# Patient Record
Sex: Female | Born: 1990 | Race: White | Hispanic: No | Marital: Single | Smoking: Former smoker
Health system: Southern US, Community
[De-identification: ages and names within clinical notes are randomized; demographics above are authoritative.]

## PROBLEM LIST (undated history)

## (undated) ENCOUNTER — Inpatient Hospital Stay (HOSPITAL_COMMUNITY): Payer: Medicaid Other

## (undated) ENCOUNTER — Inpatient Hospital Stay (HOSPITAL_COMMUNITY): Payer: Self-pay

## (undated) DIAGNOSIS — F319 Bipolar disorder, unspecified: Secondary | ICD-10-CM

## (undated) DIAGNOSIS — F209 Schizophrenia, unspecified: Secondary | ICD-10-CM

## (undated) DIAGNOSIS — A749 Chlamydial infection, unspecified: Secondary | ICD-10-CM

## (undated) DIAGNOSIS — A5901 Trichomonal vulvovaginitis: Secondary | ICD-10-CM

## (undated) DIAGNOSIS — F1990 Other psychoactive substance use, unspecified, uncomplicated: Secondary | ICD-10-CM

## (undated) DIAGNOSIS — B029 Zoster without complications: Secondary | ICD-10-CM

## (undated) DIAGNOSIS — F32A Depression, unspecified: Secondary | ICD-10-CM

## (undated) DIAGNOSIS — F329 Major depressive disorder, single episode, unspecified: Secondary | ICD-10-CM

## (undated) HISTORY — PX: DILATION AND EVACUATION: SHX1459

## (undated) HISTORY — PX: KNEE SURGERY: SHX244

---
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
## 2011-03-07 LAB — URINALYSIS, ROUTINE W REFLEX MICROSCOPIC
Nitrite: NEGATIVE
Protein, ur: 30 mg/dL — AB
Urobilinogen, UA: 0.2 mg/dL (ref 0.0–1.0)

## 2011-03-07 LAB — GC/CHLAMYDIA PROBE AMP, GENITAL
Chlamydia, DNA Probe: POSITIVE — AB
GC Probe Amp, Genital: POSITIVE — AB

## 2011-03-07 LAB — WET PREP, GENITAL: WBC, Wet Prep HPF POC: NONE SEEN

## 2011-03-07 LAB — URINE MICROSCOPIC-ADD ON

## 2011-03-12 LAB — DIFFERENTIAL
Basophils Absolute: 0.1 K/uL (ref 0.0–0.1)
Basophils Relative: 1 % (ref 0–1)
Eosinophils Absolute: 0 K/uL (ref 0.0–0.7)
Eosinophils Relative: 0 % (ref 0–5)
Eosinophils Relative: 1 % (ref 0–5)
Lymphocytes Relative: 17 % (ref 12–46)
Lymphocytes Relative: 13 % (ref 12–46)
Lymphocytes Relative: 20 % (ref 12–46)
Lymphs Abs: 1.6 K/uL (ref 0.7–4.0)
Lymphs Abs: 1.2 10*3/uL (ref 0.7–4.0)
Lymphs Abs: 1.3 10*3/uL (ref 0.7–4.0)
Monocytes Absolute: 0.9 K/uL (ref 0.1–1.0)
Monocytes Absolute: 0.9 10*3/uL (ref 0.1–1.0)
Monocytes Absolute: 0.4 10*3/uL (ref 0.1–1.0)
Monocytes Relative: 9 % (ref 3–12)
Monocytes Relative: 6 % (ref 3–12)
Neutro Abs: 6.8 K/uL (ref 1.7–7.7)
Neutro Abs: 4.7 10*3/uL (ref 1.7–7.7)
Neutrophils Relative %: 73 % (ref 43–77)

## 2011-03-12 LAB — URINALYSIS, ROUTINE W REFLEX MICROSCOPIC
Bilirubin Urine: NEGATIVE
Nitrite: NEGATIVE
Nitrite: NEGATIVE
Specific Gravity, Urine: 1.02 (ref 1.005–1.030)
Specific Gravity, Urine: 1.024 (ref 1.005–1.030)
pH: 7 (ref 5.0–8.0)

## 2011-03-12 LAB — CBC
HCT: 33.3 % — ABNORMAL LOW (ref 36.0–46.0)
HCT: 33.3 % — ABNORMAL LOW (ref 36.0–46.0)
Hemoglobin: 11.3 g/dL — ABNORMAL LOW (ref 12.0–15.0)
Hemoglobin: 11.3 g/dL — ABNORMAL LOW (ref 12.0–15.0)
Hemoglobin: 14 g/dL (ref 12.0–15.0)
MCH: 31.6 pg (ref 26.0–34.0)
MCHC: 33.9 g/dL (ref 30.0–36.0)
MCHC: 33.8 g/dL (ref 30.0–36.0)
MCV: 93.2 fL (ref 78.0–100.0)
MCV: 92.1 fL (ref 78.0–100.0)
Platelets: 197 K/uL (ref 150–400)
Platelets: 173 10*3/uL (ref 150–400)
RBC: 3.58 MIL/uL — ABNORMAL LOW (ref 3.87–5.11)
RBC: 3.62 MIL/uL — ABNORMAL LOW (ref 3.87–5.11)
RBC: 4.41 MIL/uL (ref 3.87–5.11)
RDW: 14.8 % (ref 11.5–15.5)
WBC: 9.4 K/uL (ref 4.0–10.5)
WBC: 9 10*3/uL (ref 4.0–10.5)

## 2011-03-12 LAB — URINE MICROSCOPIC-ADD ON

## 2011-03-12 LAB — URINE CULTURE

## 2011-03-12 LAB — WET PREP, GENITAL
Trich, Wet Prep: NONE SEEN
Yeast Wet Prep HPF POC: NONE SEEN

## 2011-03-12 LAB — POCT PREGNANCY, URINE
Preg Test, Ur: NEGATIVE
Preg Test, Ur: NEGATIVE
Preg Test, Ur: NEGATIVE
Preg Test, Ur: POSITIVE

## 2011-03-12 LAB — BASIC METABOLIC PANEL
Chloride: 106 mEq/L (ref 96–112)
GFR calc Af Amer: >60 mL/min (ref >60)
Potassium: 3.5 mEq/L (ref 3.5–5.1)

## 2011-03-12 LAB — GC/CHLAMYDIA PROBE AMP, GENITAL: GC Probe Amp, Genital: POSITIVE — AB

## 2011-03-12 LAB — RAPID STREP SCREEN (MED CTR MEBANE ONLY): Streptococcus, Group A Screen (Direct): NEGATIVE

## 2011-03-27 ENCOUNTER — Emergency Department (HOSPITAL_COMMUNITY)
Admission: EM | Admit: 2011-03-27 | Discharge: 2011-03-28 | Disposition: A | Discharge status: Home / Self Care | Payer: Self-pay | Attending: Emergency Medicine | Admitting: Emergency Medicine

## 2011-03-27 DIAGNOSIS — O2390 Unspecified genitourinary tract infection in pregnancy, unspecified trimester: Secondary | ICD-10-CM | POA: Insufficient documentation

## 2011-03-27 DIAGNOSIS — N760 Acute vaginitis: Secondary | ICD-10-CM | POA: Insufficient documentation

## 2011-03-27 DIAGNOSIS — B9689 Other specified bacterial agents as the cause of diseases classified elsewhere: Secondary | ICD-10-CM | POA: Insufficient documentation

## 2011-03-27 DIAGNOSIS — R10814 Left lower quadrant abdominal tenderness: Secondary | ICD-10-CM | POA: Insufficient documentation

## 2011-03-27 DIAGNOSIS — A499 Bacterial infection, unspecified: Secondary | ICD-10-CM | POA: Insufficient documentation

## 2011-03-27 DIAGNOSIS — R109 Unspecified abdominal pain: Secondary | ICD-10-CM | POA: Insufficient documentation

## 2011-03-27 LAB — URINALYSIS, ROUTINE W REFLEX MICROSCOPIC
Glucose, UA: NEGATIVE mg/dL
Hgb urine dipstick: NEGATIVE
Specific Gravity, Urine: 1.029 (ref 1.005–1.030)
pH: 6 (ref 5.0–8.0)

## 2011-03-27 LAB — URINE MICROSCOPIC-ADD ON

## 2011-03-28 ENCOUNTER — Emergency Department (HOSPITAL_COMMUNITY): Payer: Self-pay

## 2011-03-28 LAB — GC/CHLAMYDIA PROBE AMP, GENITAL: Chlamydia, DNA Probe: UNDETERMINED

## 2011-03-28 LAB — BASIC METABOLIC PANEL
CO2: 26 mEq/L (ref 19–32)
GFR calc Af Amer: >60 mL/min (ref >60)
GFR calc non Af Amer: >60 mL/min (ref >60)
Glucose, Bld: 89 mg/dL (ref 70–99)
Potassium: 3.7 mEq/L (ref 3.5–5.1)
Sodium: 138 mEq/L (ref 135–145)

## 2011-03-29 LAB — URINE CULTURE: Culture: NO GROWTH

## 2011-04-01 LAB — COMPREHENSIVE METABOLIC PANEL
ALT: 19 U/L (ref 0–35)
AST: 22 U/L (ref 0–37)
Albumin: 4.5 g/dL (ref 3.5–5.2)
Alkaline Phosphatase: 67 U/L (ref 47–119)
BUN: 10 mg/dL (ref 6–23)
Chloride: 103 mEq/L (ref 96–112)
Potassium: 3.7 mEq/L (ref 3.5–5.1)
Sodium: 137 mEq/L (ref 135–145)
Total Bilirubin: 0.4 mg/dL (ref 0.3–1.2)
Total Protein: 7.4 g/dL (ref 6.0–8.3)

## 2011-04-01 LAB — CBC
HCT: 39.1 % (ref 36.0–49.0)
Platelets: 246 10*3/uL (ref 150–400)
RDW: 13.8 % (ref 11.4–15.5)
WBC: 8.8 10*3/uL (ref 4.5–13.5)

## 2011-04-01 LAB — DIFFERENTIAL
Basophils Absolute: 0.1 10*3/uL (ref 0.0–0.1)
Basophils Relative: 1 % (ref 0–1)
Eosinophils Absolute: 0 10*3/uL (ref 0.0–1.2)
Eosinophils Relative: 0 % (ref 0–5)
Monocytes Absolute: 0.7 10*3/uL (ref 0.2–1.2)
Monocytes Relative: 8 % (ref 3–11)

## 2011-04-01 LAB — URINE MICROSCOPIC-ADD ON

## 2011-04-01 LAB — URINE CULTURE

## 2011-04-01 LAB — POCT PREGNANCY, URINE: Preg Test, Ur: NEGATIVE

## 2011-04-01 LAB — URINALYSIS, ROUTINE W REFLEX MICROSCOPIC
Nitrite: POSITIVE — AB
Specific Gravity, Urine: 1.027 (ref 1.005–1.030)
Urobilinogen, UA: 1 mg/dL (ref 0.0–1.0)
pH: 6 (ref 5.0–8.0)

## 2011-05-09 NOTE — Discharge Summary (Signed)
NAMEMARIROSE, DEVENEY                ACCOUNT NO.:  [id number]   MEDICAL RECORD NO.:  [id number]          PATIENT TYPE:  INP   LOCATION:  9109                          FACILITY:  [facility]   PHYSICIAN:  Zenaida Niece, M.D.DATE OF BIRTH:  October 15, 1991   DATE OF ADMISSION:  12/16/2008  DATE OF DISCHARGE:  12/18/2008                               DISCHARGE SUMMARY   ADMISSION DIAGNOSIS:  Intrauterine pregnancy at 39 weeks.   DISCHARGE DIAGNOSIS:  Intrauterine pregnancy at 39 weeks.   PROCEDURES:  On December 16, 2008, Dr. Ambrose Mantle did a vacuum-assisted  vaginal delivery.   HISTORY AND PHYSICAL:  This is a 20 year old, gravida 1, para 0 with an  EGA of [redacted] weeks, who was admitted for induction with a favorable cervix.  Prenatal care was essentially uncomplicated.   PRENATAL LABS:  Blood type is O positive with a negative antibody  screen, RPR nonreactive, rubella equivocal, hepatitis B surface antigen  negative, HIV negative, gonorrhea and chlamydia negative, first  trimester screen is normal.  MSAFP is normal.  One-hour Glucola is 92.  Group B strep is positive.  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   PAST HISTORY:  Significant for I and D of her right knee and is  otherwise noncontributory.   PHYSICAL EXAMINATION:  GENERAL:  She is afebrile with stable vital  signs.  ABDOMEN:  Soft, gravid with fundal height of 37 cm.  Fetal heart tracing  is normal.  Cervix per Dr. Ambrose Mantle is 3-4, 90 vertex, -1 to -2, and  membranes were ruptured revealing clear fluid.   HOSPITAL COURSE:  The patient was admitted for induction and was placed  on Pitocin, and membranes were ruptured.  She progressed into labor and  received an epidural.  She progressed to complete, pushed fair.  The  baby had deep decelerations while she was pushing with terminal  bradycardia.  Dr. Ambrose Mantle performed a Kiwi vacuum extraction of a viable  female infant with Apgars of 9 and  9, weight 5 pounds 13 ounces.  Placenta delivered and was intact and uterus palpated normal.  She had a  right labial laceration which was hemostatic and not repaired.  Estimated blood loss was 400 mL.  Postpartum, she had no significant  complications.  She did have some back discomfort.  Pre-delivery  hemoglobin 12.7 and post-delivery 11.6.  On postpartum #2, she was felt  to be stable enough for discharge home.   DISCHARGE INSTRUCTIONS:  Regular diet.  Pelvic rest.  Follow up in 6  weeks.   MEDICATIONS:  Over-the-counter ibuprofen as needed and she was given our  discharge pamphlet.     Zenaida Niece, M.D.  Electronically Signed    TDM/MEDQ  D:  12/18/2008  T:  12/18/2008  Job:  [id number]
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
## 2011-07-02 ENCOUNTER — Inpatient Hospital Stay (HOSPITAL_COMMUNITY): Payer: Self-pay | Admitting: Obstetrics & Gynecology

## 2011-07-02 ENCOUNTER — Inpatient Hospital Stay (HOSPITAL_COMMUNITY)
Admission: AD | Admit: 2011-07-02 | Discharge: 2011-07-02 | Disposition: A | Discharge status: Home / Self Care | Payer: Self-pay | Source: Ambulatory Visit | Attending: Obstetrics & Gynecology | Admitting: Obstetrics & Gynecology

## 2011-07-02 ENCOUNTER — Encounter (HOSPITAL_COMMUNITY): Payer: Self-pay | Admitting: *Deleted

## 2011-07-02 ENCOUNTER — Inpatient Hospital Stay (HOSPITAL_COMMUNITY): Admission: AD | Admit: 2011-07-02 | Payer: Self-pay | Source: Ambulatory Visit | Admitting: Obstetrics & Gynecology

## 2011-07-02 DIAGNOSIS — R109 Unspecified abdominal pain: Secondary | ICD-10-CM

## 2011-07-02 DIAGNOSIS — O368190 Decreased fetal movements, unspecified trimester, not applicable or unspecified: Secondary | ICD-10-CM | POA: Insufficient documentation

## 2011-07-02 DIAGNOSIS — R51 Headache: Secondary | ICD-10-CM | POA: Insufficient documentation

## 2011-07-02 HISTORY — DX: Zoster without complications: B02.9

## 2011-07-02 LAB — URINALYSIS, ROUTINE W REFLEX MICROSCOPIC
Bilirubin Urine: NEGATIVE
Glucose, UA: NEGATIVE mg/dL
Hgb urine dipstick: NEGATIVE
Ketones, ur: NEGATIVE mg/dL
pH: 7.5 (ref 5.0–8.0)

## 2011-07-02 LAB — URINE DRUGS OF ABUSE SCREEN W ALC, ROUTINE (REF LAB)
Amphetamine Screen, Ur: NEGATIVE
Barbiturate Quant, Ur: NEGATIVE
Cocaine Metabolites: NEGATIVE
Creatinine,U: 194.5 mg/dL
Marijuana Metabolite: POSITIVE — AB
Methadone: NEGATIVE

## 2011-07-02 LAB — URINE MICROSCOPIC-ADD ON

## 2011-07-02 NOTE — ED Provider Notes (Signed)
History     Chief Complaint  Patient presents with  . Decreased Fetal Movement    Pt came in c/o decreases fetal movement, lower abd cramping, headache   HPI  Pertinent Gynecological History: Menses: decreased fetal movemant Bleeding:none Contraception:  DES exposure:none Blood transfusions: none Sexually transmitted diseases: denies Previous GYN Procedures:none  Last mammogram:n/a Last pap: unknown   Past Medical History  Diagnosis Date  . Shingles     Past Surgical History  Procedure Date  . Knee surgery     History reviewed. No pertinent family history.  History  Substance Use Topics  . Smoking status: Current Some Day Smoker -- 0.2 packs/day  . Smokeless tobacco: Never Used  . Alcohol Use: No    Allergies: No Known Allergies  No prescriptions prior to admission    ROS Physical Exam   Blood pressure 116/72, pulse 93, temperature 98 F (36.7 C), temperature source Oral, resp. rate 18, height 5' 3.5" (1.613 m), weight 55.702 kg (122 lb 12.8 oz).  Physical Exam  MAU Course  Procedures  MDM Normal exam

## 2011-07-02 NOTE — ED Provider Notes (Addendum)
History     Chief Complaint  Patient presents with  . Decreased Fetal Movement    Pt came in c/o decreases fetal movement, lower abd cramping, headache   HPI  Pertinent Gynecological History: Menses: n/a Bleeding: n/a Contraception: none DES exposure: denies Blood transfusions: none Sexually transmitted diseases:denies Previous GYN Procedures: none  Last mammogram:n/a Last FAO:[id number]   Past Medical History  Diagnosis Date  . Shingles     Past Surgical History  Procedure Date  . Knee surgery     History reviewed. No pertinent family history.  History  Substance Use Topics  . Smoking status: Current Some Day Smoker -- 0.2 packs/day  . Smokeless tobacco: Never Used  . Alcohol Use: No    Allergies: No Known Allergies  No prescriptions prior to admission    ROS Physical Exam   Blood pressure 116/72, pulse 93, temperature 98 F (36.7 C), temperature source Oral, resp. rate 18, height 5' 3.5" (1.613 m), weight 55.702 kg (122 lb 12.8 oz).  Physical Exam  Constitutional: She is oriented to person, place, and time. She appears well-developed and well-nourished.  HENT:  Head: Normocephalic.  Cardiovascular: Normal rate, regular rhythm and normal heart sounds.   Respiratory: Effort normal and breath sounds normal.  GI: Soft.       Gravid, uterus @ umbilicus.  Genitourinary: Vagina normal.  Musculoskeletal: Normal range of motion.  Neurological: She is alert and oriented to person, place, and time. She has normal reflexes.  Skin: Skin is warm and dry.  Psychiatric: She has a normal mood and affect. Her behavior is normal. Judgment and thought content normal.    MAU Course  Procedures  MDM Normal physical exam

## 2011-07-05 LAB — THC (MARIJUANA), URINE, CONFIRMATION: Marijuana, Ur-Confirmation: 99 NG/ML — ABNORMAL HIGH

## 2011-09-09 ENCOUNTER — Inpatient Hospital Stay (HOSPITAL_COMMUNITY)
Admission: AD | Admit: 2011-09-09 | Discharge: 2011-09-09 | Disposition: A | Discharge status: Home / Self Care | Payer: Self-pay | Source: Ambulatory Visit | Attending: Obstetrics and Gynecology | Admitting: Obstetrics and Gynecology

## 2011-09-09 ENCOUNTER — Encounter (HOSPITAL_COMMUNITY): Payer: Self-pay | Admitting: *Deleted

## 2011-09-09 DIAGNOSIS — N898 Other specified noninflammatory disorders of vagina: Secondary | ICD-10-CM | POA: Insufficient documentation

## 2011-09-09 DIAGNOSIS — O26899 Other specified pregnancy related conditions, unspecified trimester: Secondary | ICD-10-CM

## 2011-09-09 DIAGNOSIS — O99891 Other specified diseases and conditions complicating pregnancy: Secondary | ICD-10-CM | POA: Insufficient documentation

## 2011-09-09 LAB — URINALYSIS, ROUTINE W REFLEX MICROSCOPIC
Bilirubin Urine: NEGATIVE
Nitrite: NEGATIVE
Specific Gravity, Urine: 1.01 (ref 1.005–1.030)
Urobilinogen, UA: 0.2 mg/dL (ref 0.0–1.0)

## 2011-09-09 LAB — URINE MICROSCOPIC-ADD ON

## 2011-09-09 NOTE — ED Provider Notes (Signed)
Stacy Peterson is a 20 y.o. female G3P1011 at 31.4 wks by known LMP 02/01/11 who presents for eval of mucous discharge. Maternal Medical History:  Reason for admission: Reason for Admission:   nausea  OB History    Grav Para Term Preterm Abortions TAB SAB Ect Mult Living   3 1 1  1 1    1      Past Medical History  Diagnosis Date  . Shingles   . No pertinent past medical history    Past Surgical History  Procedure Date  . Knee surgery   . Knee surgery    Family History: family history is not on file. Social History:  reports that she has been smoking.  She has never used smokeless tobacco. She reports that she does not drink alcohol or use illicit drugs.  Review of Systems  Constitutional: Negative for fever.  Gastrointestinal: Negative for nausea and vomiting.  Genitourinary: Negative for dysuria.      Blood pressure 112/65, pulse 96, temperature 98.9 F (37.2 C), temperature source Oral, resp. rate 18, height 5' 3.25" (1.607 m), weight 59.648 kg (131 lb 8 oz). Maternal Exam:  Uterine Assessment: No ctx per toco  Abdomen: Fundal height is 30cm.    Introitus: Normal vulva. Normal vagina.  Cervix: Cervix evaluated by digital exam.   Cx closed/thick/post Small tan colored mucous seen at introitus with appearance similar to cervical mucous; nonodorous  Fetal Exam Fetal Monitor Review: Baseline rate: 135.  Variability: moderate (6-25 bpm).   Pattern: accelerations present and no decelerations.    Fetal State Assessment: Category I - tracings are normal.     Physical Exam  Constitutional: She is oriented to person, place, and time. She appears well-developed and well-nourished.  Respiratory: Effort normal.  Musculoskeletal: Normal range of motion.  Neurological: She is oriented to person, place, and time.  Skin: Skin is warm and dry.  Psychiatric: She has a normal mood and affect. Her behavior is normal.    Prenatal labs: ABO, Rh:   Antibody:   Rubella:   RPR:     HBsAg:    HIV:    GBS:     Assessment/Plan: IUP at 31.4 (by LMP; agrees with exam) Vaginal discharge  D/C home with pregnancy precautions Given preg verification letter to speed up process to obtain MCD Follow up for ctx, pain, bldg or leaking as needed   Cam Hai 09/09/2011, 10:53 PM

## 2011-09-09 NOTE — Progress Notes (Signed)
Pt states, " I felt something on my leg while I was in the shower, and when I reached and touched it was thick and mucous like my mucous plug. I have been cramping off and on since 7 pm tonight." .

## 2011-09-09 NOTE — Progress Notes (Signed)
Pt states she she picked up her 20 year old at about 60. Pt states she started feeling discomfort immediately after that.

## 2011-09-13 NOTE — ED Provider Notes (Signed)
Agree with above note.  Stacy Peterson 09/13/2011 10:29 AM

## 2011-09-20 LAB — URINALYSIS, ROUTINE W REFLEX MICROSCOPIC
Bilirubin Urine: NEGATIVE
Nitrite: NEGATIVE
Specific Gravity, Urine: 1.023
pH: 6.5

## 2011-09-20 LAB — HCG, QUANTITATIVE, PREGNANCY

## 2011-09-20 LAB — POCT I-STAT, CHEM 8
BUN: 6
Calcium, Ion: 1.25
Chloride: 102
Creatinine, Ser: 0.7
Glucose, Bld: 104 — ABNORMAL HIGH
HCT: 42
Hemoglobin: 14.3
Potassium: 3.6
Sodium: 138
TCO2: 22

## 2011-09-20 LAB — URINE MICROSCOPIC-ADD ON

## 2011-09-20 LAB — CBC
MCHC: 34.3
RDW: 13.1

## 2011-09-20 LAB — DIFFERENTIAL
Basophils Absolute: 0
Basophils Relative: 0
Eosinophils Absolute: 0
Eosinophils Relative: 0
Lymphocytes Relative: 5 — ABNORMAL LOW
Lymphs Abs: 0.9 — ABNORMAL LOW
Monocytes Absolute: 0.8
Monocytes Relative: 4
Neutro Abs: 17.4 — ABNORMAL HIGH
Neutrophils Relative %: 91 — ABNORMAL HIGH

## 2011-09-20 LAB — POCT PREGNANCY, URINE: Operator id: 294501

## 2011-09-20 LAB — ABO/RH: ABO/RH(D): O POS

## 2011-09-20 LAB — SEDIMENTATION RATE: Sed Rate: 6

## 2011-09-26 LAB — WET PREP, GENITAL: Trich, Wet Prep: NONE SEEN

## 2011-09-29 LAB — RPR: RPR Ser Ql: NONREACTIVE

## 2011-09-29 LAB — CBC
HCT: 33.4 % — ABNORMAL LOW (ref 36.0–49.0)
MCHC: 34.6 g/dL (ref 31.0–37.0)
MCV: 98.2 fL — ABNORMAL HIGH (ref 78.0–98.0)
MCV: 99.5 fL — ABNORMAL HIGH (ref 78.0–98.0)
Platelets: 145 10*3/uL — ABNORMAL LOW (ref 150–400)
RBC: 3.75 MIL/uL — ABNORMAL LOW (ref 3.80–5.70)
RDW: 13.4 % (ref 11.4–15.5)

## 2011-11-03 ENCOUNTER — Inpatient Hospital Stay (HOSPITAL_COMMUNITY)
Admission: AD | Admit: 2011-11-03 | Discharge: 2011-11-05 | DRG: 775 | Disposition: A | Discharge status: Home / Self Care | Payer: Self-pay | Source: Ambulatory Visit | Attending: Obstetrics & Gynecology | Admitting: Obstetrics & Gynecology

## 2011-11-03 ENCOUNTER — Encounter (HOSPITAL_COMMUNITY): Payer: Self-pay | Admitting: *Deleted

## 2011-11-03 ENCOUNTER — Encounter (HOSPITAL_COMMUNITY): Payer: Self-pay | Admitting: Anesthesiology

## 2011-11-03 ENCOUNTER — Inpatient Hospital Stay (HOSPITAL_COMMUNITY): Payer: Self-pay | Admitting: Anesthesiology

## 2011-11-03 DIAGNOSIS — O0930 Supervision of pregnancy with insufficient antenatal care, unspecified trimester: Secondary | ICD-10-CM

## 2011-11-03 LAB — DIFFERENTIAL
Basophils Absolute: 0 10*3/uL (ref 0.0–0.1)
Basophils Relative: 0 % (ref 0–1)
Eosinophils Relative: 1 % (ref 0–5)
Monocytes Absolute: 0.9 10*3/uL (ref 0.1–1.0)

## 2011-11-03 LAB — CBC
HCT: 32.8 % — ABNORMAL LOW (ref 36.0–46.0)
MCHC: 33.2 g/dL (ref 30.0–36.0)
MCV: 89.9 fL (ref 78.0–100.0)
RDW: 14.6 % (ref 11.5–15.5)

## 2011-11-03 LAB — RPR: RPR Ser Ql: NONREACTIVE

## 2011-11-03 MED ORDER — PRENATAL PLUS 27-1 MG PO TABS
1.0000 | ORAL_TABLET | Freq: Every day | ORAL | Status: DC
  Administered 2011-11-04 – 2011-11-05 (×2): 1 via ORAL
  Filled 2011-11-03 (×2): qty 1

## 2011-11-03 MED ORDER — LACTATED RINGERS IV SOLN: INTRAVENOUS | Status: DC

## 2011-11-03 MED ORDER — LIDOCAINE HCL 1.5 % IJ SOLN
INTRAMUSCULAR | Status: DC | PRN
  Administered 2011-11-03: 5 mL via INTRADERMAL
  Administered 2011-11-03: 2 mL via INTRADERMAL
  Administered 2011-11-03: 5 mL via INTRADERMAL

## 2011-11-03 MED ORDER — ACETAMINOPHEN 325 MG PO TABS: 650.0000 mg | ORAL_TABLET | ORAL | Status: DC | PRN

## 2011-11-03 MED ORDER — DIPHENHYDRAMINE HCL 50 MG/ML IJ SOLN: 12.5000 mg | INTRAMUSCULAR | Status: DC | PRN

## 2011-11-03 MED ORDER — LANOLIN HYDROUS EX OINT: TOPICAL_OINTMENT | CUTANEOUS | Status: DC | PRN

## 2011-11-03 MED ORDER — SENNOSIDES-DOCUSATE SODIUM 8.6-50 MG PO TABS
2.0000 | ORAL_TABLET | Freq: Every day | ORAL | Status: DC
  Administered 2011-11-04: 2 via ORAL

## 2011-11-03 MED ORDER — OXYTOCIN BOLUS FROM INFUSION
500.0000 mL | Freq: Once | INTRAVENOUS | Status: DC
  Filled 2011-11-03: qty 500

## 2011-11-03 MED ORDER — BENZOCAINE-MENTHOL 20-0.5 % EX AERO
INHALATION_SPRAY | CUTANEOUS | Status: AC
  Filled 2011-11-03: qty 56

## 2011-11-03 MED ORDER — LACTATED RINGERS IV SOLN: 500.0000 mL | INTRAVENOUS | Status: DC | PRN

## 2011-11-03 MED ORDER — EPHEDRINE 5 MG/ML INJ: 10.0000 mg | INTRAVENOUS | Status: DC | PRN

## 2011-11-03 MED ORDER — SIMETHICONE 80 MG PO CHEW: 80.0000 mg | CHEWABLE_TABLET | ORAL | Status: DC | PRN

## 2011-11-03 MED ORDER — FENTANYL 2.5 MCG/ML BUPIVACAINE 1/10 % EPIDURAL INFUSION (WH - ANES)
14.0000 mL/h | INTRAMUSCULAR | Status: DC
  Administered 2011-11-03: 14 mL/h via EPIDURAL
  Filled 2011-11-03: qty 60

## 2011-11-03 MED ORDER — DIPHENHYDRAMINE HCL 25 MG PO CAPS: 25.0000 mg | ORAL_CAPSULE | Freq: Four times a day (QID) | ORAL | Status: DC | PRN

## 2011-11-03 MED ORDER — OXYCODONE-ACETAMINOPHEN 5-325 MG PO TABS: 2.0000 | ORAL_TABLET | ORAL | Status: DC | PRN

## 2011-11-03 MED ORDER — ACETAMINOPHEN 325 MG PO TABS
650.0000 mg | ORAL_TABLET | Freq: Four times a day (QID) | ORAL | Status: DC | PRN
  Administered 2011-11-03: 650 mg via ORAL
  Filled 2011-11-03: qty 2

## 2011-11-03 MED ORDER — NALBUPHINE SYRINGE 5 MG/0.5 ML: 5.0000 mg | INJECTION | INTRAMUSCULAR | Status: DC | PRN

## 2011-11-03 MED ORDER — CITRIC ACID-SODIUM CITRATE 334-500 MG/5ML PO SOLN: 30.0000 mL | ORAL | Status: DC | PRN

## 2011-11-03 MED ORDER — DIBUCAINE 1 % RE OINT: 1.0000 "application " | TOPICAL_OINTMENT | RECTAL | Status: DC | PRN

## 2011-11-03 MED ORDER — LIDOCAINE HCL (PF) 1 % IJ SOLN: 30.0000 mL | INTRAMUSCULAR | Status: DC | PRN

## 2011-11-03 MED ORDER — ONDANSETRON HCL 4 MG/2ML IJ SOLN: 4.0000 mg | Freq: Four times a day (QID) | INTRAMUSCULAR | Status: DC | PRN

## 2011-11-03 MED ORDER — WITCH HAZEL-GLYCERIN EX PADS: 1.0000 "application " | MEDICATED_PAD | CUTANEOUS | Status: DC | PRN

## 2011-11-03 MED ORDER — NALBUPHINE SYRINGE 5 MG/0.5 ML
5.0000 mg | INJECTION | INTRAMUSCULAR | Status: DC | PRN
  Administered 2011-11-03: 5 mg via INTRAVENOUS
  Filled 2011-11-03: qty 0.5

## 2011-11-03 MED ORDER — FLEET ENEMA 7-19 GM/118ML RE ENEM: 1.0000 | ENEMA | RECTAL | Status: DC | PRN

## 2011-11-03 MED ORDER — OXYTOCIN 20 UNITS IN LACTATED RINGERS INFUSION - SIMPLE: 125.0000 mL/h | Freq: Once | INTRAVENOUS | Status: DC

## 2011-11-03 MED ORDER — ONDANSETRON HCL 4 MG PO TABS: 4.0000 mg | ORAL_TABLET | ORAL | Status: DC | PRN

## 2011-11-03 MED ORDER — ONDANSETRON 8 MG PO TBDP
8.0000 mg | ORAL_TABLET | Freq: Once | ORAL | Status: AC
  Administered 2011-11-03: 8 mg via ORAL
  Filled 2011-11-03: qty 1

## 2011-11-03 MED ORDER — LACTATED RINGERS IV SOLN: 500.0000 mL | Freq: Once | INTRAVENOUS | Status: DC

## 2011-11-03 MED ORDER — IBUPROFEN 600 MG PO TABS: 600.0000 mg | ORAL_TABLET | Freq: Four times a day (QID) | ORAL | Status: DC | PRN

## 2011-11-03 MED ORDER — EPHEDRINE 5 MG/ML INJ
10.0000 mg | INTRAVENOUS | Status: DC | PRN
  Filled 2011-11-03: qty 4

## 2011-11-03 MED ORDER — LACTATED RINGERS IV SOLN
INTRAVENOUS | Status: DC
  Administered 2011-11-03: 14:00:00 via INTRAVENOUS

## 2011-11-03 MED ORDER — ZOLPIDEM TARTRATE 5 MG PO TABS: 5.0000 mg | ORAL_TABLET | Freq: Every evening | ORAL | Status: DC | PRN

## 2011-11-03 MED ORDER — OXYTOCIN BOLUS FROM INFUSION
500.0000 mL | Freq: Once | INTRAVENOUS | Status: DC
  Filled 2011-11-03: qty 1000
  Filled 2011-11-03: qty 500

## 2011-11-03 MED ORDER — ONDANSETRON HCL 4 MG/2ML IJ SOLN: 4.0000 mg | INTRAMUSCULAR | Status: DC | PRN

## 2011-11-03 MED ORDER — PHENYLEPHRINE 40 MCG/ML (10ML) SYRINGE FOR IV PUSH (FOR BLOOD PRESSURE SUPPORT)
80.0000 ug | PREFILLED_SYRINGE | INTRAVENOUS | Status: DC | PRN
  Filled 2011-11-03: qty 5

## 2011-11-03 MED ORDER — OXYCODONE-ACETAMINOPHEN 5-325 MG PO TABS
1.0000 | ORAL_TABLET | ORAL | Status: DC | PRN
  Administered 2011-11-04 – 2011-11-05 (×3): 1 via ORAL
  Filled 2011-11-03 (×3): qty 1

## 2011-11-03 MED ORDER — PHENYLEPHRINE 40 MCG/ML (10ML) SYRINGE FOR IV PUSH (FOR BLOOD PRESSURE SUPPORT): 80.0000 ug | PREFILLED_SYRINGE | INTRAVENOUS | Status: DC | PRN

## 2011-11-03 MED ORDER — IBUPROFEN 600 MG PO TABS
600.0000 mg | ORAL_TABLET | Freq: Four times a day (QID) | ORAL | Status: DC
  Administered 2011-11-03 – 2011-11-05 (×6): 600 mg via ORAL
  Filled 2011-11-03 (×6): qty 1

## 2011-11-03 MED ORDER — IBUPROFEN 600 MG PO TABS
600.0000 mg | ORAL_TABLET | Freq: Four times a day (QID) | ORAL | Status: DC | PRN
  Administered 2011-11-03: 600 mg via ORAL
  Filled 2011-11-03: qty 1

## 2011-11-03 MED ORDER — BENZOCAINE-MENTHOL 20-0.5 % EX AERO: 1.0000 "application " | INHALATION_SPRAY | CUTANEOUS | Status: DC | PRN

## 2011-11-03 MED ORDER — TETANUS-DIPHTH-ACELL PERTUSSIS 5-2.5-18.5 LF-MCG/0.5 IM SUSP: 0.5000 mL | Freq: Once | INTRAMUSCULAR | Status: DC

## 2011-11-03 NOTE — Anesthesia Preprocedure Evaluation (Signed)
Anesthesia Evaluation  Patient identified by MRN, date of birth, ID band Patient awake    Reviewed: Allergy & Precautions, H&P , NPO status , Patient's Chart, lab work & pertinent test results, reviewed documented beta blocker date and time   History of Anesthesia Complications Negative for: history of anesthetic complications  Airway Mallampati: II TM Distance: >3 FB Neck ROM: full    Dental  (+) Teeth Intact   Pulmonary Current Smoker,  clear to auscultation        Cardiovascular neg cardio ROS regular Normal    Neuro/Psych Negative Neurological ROS  Negative Psych ROS   GI/Hepatic negative GI ROS, Neg liver ROS,   Endo/Other  Negative Endocrine ROS  Renal/GU negative Renal ROS     Musculoskeletal   Abdominal   Peds  Hematology negative hematology ROS (+)   Anesthesia Other Findings   Reproductive/Obstetrics (+) Pregnancy (limited prenatal care)                           Anesthesia Physical Anesthesia Plan  ASA: II  Anesthesia Plan: Epidural   Post-op Pain Management:    Induction:   Airway Management Planned:   Additional Equipment:   Intra-op Plan:   Post-operative Plan:   Informed Consent: I have reviewed the patients History and Physical, chart, labs and discussed the procedure including the risks, benefits and alternatives for the proposed anesthesia with the patient or authorized representative who has indicated his/her understanding and acceptance.     Plan Discussed with:   Anesthesia Plan Comments:         Anesthesia Quick Evaluation

## 2011-11-03 NOTE — ED Provider Notes (Addendum)
History     Chief Complaint  Patient presents with  . Contractions   HPI Mrs. Stacy Peterson is 20 year old at 39.[redacted] weeks EGA who presents with contractions starting at 5:00 AM. They are strong in nature and coming every 4 minutes. She denies leakage of fluid or vaginal bleeding. She notes fetal movements. Of note, the patient has had no prenatal care, aside from a trip to the [facility]. She applied for Medicaid, but has not had an appointment at [facility] yet. She has had 1 ultrasound and not pre-natal labs drawn.    OB History    Grav Para Term Preterm Abortions TAB SAB Ect Mult Living   3 1 1  1 1    1     - Previous delivery was in 2009 at [facility], patient induced   Past Medical History  Diagnosis Date  . Shingles   . No pertinent past medical history     Past Surgical History: Knee Surgery 2009    No family history on file.  History  Substance Use Topics  . Smoking status: Current Some Day Smoker -- 0.2 packs/day  . Smokeless tobacco: Never Used  . Alcohol Use: No    Allergies: No Known Allergies  Prescriptions prior to admission  Medication Sig Dispense Refill  . ibuprofen (ADVIL,MOTRIN) 200 MG tablet Take 400 mg by mouth daily as needed. For pain         ROS positive for nausea and vomiting; negative for fever, chills, headaches, changes in vision, shortness of breath, chest pain   Physical Exam   Blood pressure 122/79, pulse 95, temperature 97.4 F (36.3 C), temperature source Oral, resp. rate 20, height 5\' 4"  (1.626 m), weight 63.504 kg (140 lb).  Physical Exam  Constitutional: Vital signs are normal. She appears well-developed and well-nourished. She appears distressed.  Cardiovascular: Normal rate, regular rhythm, normal heart sounds and intact distal pulses.   Respiratory: Effort normal and breath sounds normal.  GI:       Gravid, non-tender   Genitourinary: Vagina normal.  Musculoskeletal: She exhibits no edema.     MAU Course  Procedures  Dilation:  2.5 Effacement (%): 50 Cervical Position: Posterior Station: -2 Presentation: Vertex Exam by:: Stacy Peterson: Contractions q FHM: baseline 135, moderate variability, accelerations present, no decelerations  - Category I  MDM Patient making cervical change with consistent contractions.   Assessment and Plan  Stacy Peterson has made cervical change since admission, and is having consistent contraction. She is ambulating in the hospital and will be re-evaluated in approximately 30 minutes.   The patient has been signed out to Dr. Candelaria Peterson.   Stacy Peterson 11/03/2011, 12:17 PM    Patient seen.  Having intense pain with contractions.  Repeat cervical exam as follows: Dilation: 4 Effacement (%): 70 Cervical Position: Posterior Station: -2 Presentation: Vertex Exam by:: Stacy Peterson  Assessment and plan #49 20 year old G3 P1 011 at 39 weeks and 3 days #2 insufficient prenatal care #3 GBS unknown  We'll admit the patient to labor and delivery for active labor. We will provide the patient with pain medication with her IV as well as epidural as the patient has indicated that she would like an epidural. She will bottlefeed and [facility] pediatric[facility] will be the physician for the baby.  She does not meet criteria for treatment for GBS prophylaxis.  Stacy Peterson 1:57 PM 11/03/2011

## 2011-11-03 NOTE — Anesthesia Procedure Notes (Signed)
Epidural Patient location during procedure: OB Start time: 11/03/2011 3:49 PM Reason for block: procedure for pain  Staffing Performed by: anesthesiologist   Preanesthetic Checklist Completed: patient identified, site marked, surgical consent, pre-op evaluation, timeout performed, IV checked, risks and benefits discussed and monitors and equipment checked  Epidural Patient position: sitting Prep: site prepped and draped and DuraPrep Patient monitoring: continuous pulse ox and blood pressure Approach: midline Injection technique: LOR air  Needle:  Needle type: Tuohy  Needle gauge: 17 G Needle length: 9 cm Needle insertion depth: 5 cm cm Catheter type: closed end flexible Catheter size: 19 Gauge Catheter at skin depth: 10 cm Test dose: negative  Assessment Events: blood not aspirated, injection not painful, no injection resistance, negative IV test and no paresthesia  Additional Notes Discussed risk of headache, infection, bleeding, nerve injury and failed or incomplete block.  Patient voices understanding and wishes to proceed.

## 2011-11-03 NOTE — Progress Notes (Signed)
Transferred to room 136 via wheelchair.

## 2011-11-03 NOTE — Significant Event (Signed)
At approx 1820,pt went to BR without RN's assistance- was accompanied by sister, who states pt voided a large amt.  Pt reminded about safety precautions from epidural and to call RN for assistance in future until safe ambulation determined. Pt denies falling or any injury.

## 2011-11-03 NOTE — Progress Notes (Signed)
uc's since 0500, denies bleeding or LOF.  No PNC.

## 2011-11-03 NOTE — H&P (Signed)
see MAU note

## 2011-11-04 NOTE — Progress Notes (Signed)
PSYCHOSOCIAL ASSESSMENT ~ MATERNAL/CHILD Name: Stacy Peterson Age: 20 Referral Date: 11/04/2011 Reason/Source: LPNC  I. FAMILY/HOME ENVIRONMENT A. Child's Legal Guardian Name: Stacy Peterson  DOB:   02/28/1991                                               Age: 20                   Address: [address]                                   [place] [address]   Name: Stacy Peterson DOB:                                                  Age:                   Address:    B. Other Household Members/Support Persons Name: Stacy Peterson Relationship:  Sister                  DOB:        Name: Stacy Peterson                   Relationship: Mother               DOB:        Name:  Stacy Peterson                      Relationship: Step-Father               DOB:                   Name:                   Relationship:               DOB: C.   Other Support:   II. PSYCHOSOCIAL DATA A. Information Source: [name] and [name]'s sister                    B. Financial and Community Resources         Employment:    Medicaid:  YES   County: [place]  Private Insurance:                            Self Pay:   Food Stamps:        WIC: YES      Work First:       Public Housing:       Section 8:    Maternity Care Coordination/Child Service Coordination/Early Intervention   School:                                                                         Grade:  Other:  C. Cultural and Environment Information Cultural Issues Impacting Care: N/A III. STRENGTHS             Supportive family/friends: YES             Adequate Resources: YES             Compliance with medical plan: YES             Home prepared for Child (including basic supplies): YES             Understanding of Illness: N/A             Other:   IV. RISK FACTORS AND CURRENT PROBLEMS       No Problems Noted               Substance abuse:                                    Pt:            Family:             Family/Relationship Issues:                      Pt:            Family:             Financial Resources:                               Pt:            Family:             DSS Involvement:                                    Pt:             Family:             Knowledge/Cognitive Deficit:                   Pt:             Family:                Basic Needs(food, housing, etc.)             Pt:             Family:             Mental Illness:                                           Pt:             Family:             Abuse/Neglect/Domestic Violence           Pt:             Family:             Transportation:                                           Pt:              Family:             Adjustment to Illness:                               Pt:              Family:             Compliance with Treatment:                    Pt:              Family:             Housing Concerns                                   Pt:              Family:             Other:               V. SOCIAL WORK ASSESSMENT  CSW spoke to [name].  [name] reported LPNC due to issues with insurance.  No current concerns, medicaid in process along with WIC.  [name] did not report any concerns with support and supplies. Please re-consult CSW if further needs arise.    VI. SOCIAL WORK PLAN (in bold)             No Further Intervention Required/ No Barriers to Discharge             Psychosocial Support and Ongoing Assessment if Needs             Patient/Family Education             Child Protective Services Report                      County:                       Date:             Information/Referral to Community Resources             Other  

## 2011-11-04 NOTE — Progress Notes (Signed)
Post Partum Day 1 Subjective: no complaints, up ad lib and ordered breakfast  Objective: Blood pressure 89/49, pulse 72, temperature 97.9 F (36.6 C), temperature source Oral, resp. rate 19, height 5\' 4"  (1.626 m), weight 63.504 kg (140 lb), SpO2 100.00%, unknown if currently breastfeeding.  Physical Exam:  General: alert and cooperative Lochia: appropriate Uterine Fundus: firm DVT Evaluation: No evidence of DVT seen on physical exam. No significant calf/ankle edema.   Basename 11/03/11 1021  HGB 10.9*  HCT 32.8*    Assessment/Plan: Plan for discharge tomorrow as insufficient PNC Breastfeeding/bottlefeeding and Contraception undecided   LOS: 1 day   Stacy Peterson 11/04/2011, 9:28 AM

## 2011-11-05 MED ORDER — IBUPROFEN 600 MG PO TABS: 600.0000 mg | ORAL_TABLET | Freq: Four times a day (QID) | ORAL | Status: AC

## 2011-11-05 MED ORDER — SENNOSIDES-DOCUSATE SODIUM 8.6-50 MG PO TABS: 2.0000 | ORAL_TABLET | Freq: Every day | ORAL | Status: DC

## 2011-11-05 MED ORDER — FERROUS SULFATE 325 (65 FE) MG PO TABS: 325.0000 mg | ORAL_TABLET | Freq: Every day | ORAL | Status: DC

## 2011-11-05 NOTE — Discharge Summary (Signed)
Obstetric Discharge Summary Reason for Admission: onset of labor Prenatal Procedures: NST Intrapartum Procedures: spontaneous vaginal delivery Postpartum Procedures: none Complications-Operative and Postpartum: none Hemoglobin  Date Value Range Status  11/03/2011 10.9* 12.0-15.0 (g/dL) Final     HCT  Date Value Range Status  11/03/2011 32.8* 36.0-46.0 (%) Final    Discharge Diagnoses: Term Pregnancy-delivered and no prenatal care  20 yo G3P1011 presented at 39.3 weeks with SOOL. Uncomplicated delivery of viable female infant. Patient had no PNC due to insurance problems, Medicaid pending. Prenatal screening labs negative except noted history of marijuana use. SW consult prior to DC revealed no concerns. Patient will f/u at [facility] in 6 weeks to discuss contraception (desires nuva-ring). Bottlefeeding infant. Pain control with motrin.   Discharge Information: Date: 11/05/2011 Activity: pelvic rest Diet: routine Medications: PNV, Ibuprofen, Colace and Iron Condition: stable Instructions: refer to practice specific booklet Discharge to: home Follow-up Information    Follow up with [facility]. Make an appointment in 6 weeks.   Contact information:   [address] [place] [address]          Newborn Data: Live born female  Birth Weight: 6 lb 11 oz (3033 g) APGAR: 9, 9  Home with mother.  Stacy Peterson 11/05/2011, 6:54 AM

## 2011-11-05 NOTE — Progress Notes (Signed)
Post Partum Day 2 Subjective: up ad lib, voiding and tolerating PO; pain controlled with motrin.   Objective: Blood pressure 95/55, pulse 60, temperature 98.5 F (36.9 C), temperature source Oral, resp. rate 18, height 5\' 4"  (1.626 m), weight 63.504 kg (140 lb), SpO2 100.00%, unknown if currently breastfeeding.  Physical Exam:  General: alert and cooperative Lochia: appropriate Uterine Fundus: firm DVT Evaluation: No cords or calf tenderness. No significant calf/ankle edema.   Basename 11/03/11 1021  HGB 10.9*  HCT 32.8*    Assessment/Plan: Social Work consult yesterday for no PNC: cleared for DC. Contraception undecided Bottle feeding.    LOS: 2 days   Ghislaine Harcum 11/05/2011, 6:40 AM

## 2011-11-05 NOTE — Anesthesia Postprocedure Evaluation (Signed)
  Anesthesia Post-op Note  Patient: Stacy Peterson  Procedure(s) Performed: * No procedures listed *  Patient Location: 136  Anesthesia Type: Epidural  Level of Consciousness: awake, alert  and oriented  Airway and Oxygen Therapy: Patient Spontanous Breathing  Post-op Pain: mild  Post-op Assessment: Post-op Vital signs reviewed, Patient's Cardiovascular Status Stable, No headache, No backache, No residual numbness and No residual motor weakness  Post-op Vital Signs: Reviewed and stable  Complications: No apparent anesthesia complications

## 2011-11-06 NOTE — Progress Notes (Signed)
UR chart review completed.  

## 2012-05-28 ENCOUNTER — Emergency Department (HOSPITAL_COMMUNITY): Payer: Self-pay

## 2012-05-28 ENCOUNTER — Emergency Department (HOSPITAL_COMMUNITY)
Admission: EM | Admit: 2012-05-28 | Discharge: 2012-05-28 | Disposition: A | Discharge status: Home / Self Care | Payer: Self-pay | Attending: Emergency Medicine | Admitting: Emergency Medicine

## 2012-05-28 ENCOUNTER — Encounter (HOSPITAL_COMMUNITY): Payer: Self-pay | Admitting: *Deleted

## 2012-05-28 DIAGNOSIS — N390 Urinary tract infection, site not specified: Secondary | ICD-10-CM | POA: Insufficient documentation

## 2012-05-28 DIAGNOSIS — N949 Unspecified condition associated with female genital organs and menstrual cycle: Secondary | ICD-10-CM | POA: Insufficient documentation

## 2012-05-28 DIAGNOSIS — O2390 Unspecified genitourinary tract infection in pregnancy, unspecified trimester: Secondary | ICD-10-CM | POA: Insufficient documentation

## 2012-05-28 DIAGNOSIS — Z331 Pregnant state, incidental: Secondary | ICD-10-CM | POA: Insufficient documentation

## 2012-05-28 DIAGNOSIS — R109 Unspecified abdominal pain: Secondary | ICD-10-CM | POA: Insufficient documentation

## 2012-05-28 LAB — URINALYSIS, ROUTINE W REFLEX MICROSCOPIC
Bilirubin Urine: NEGATIVE
Ketones, ur: 40 mg/dL — AB
Nitrite: POSITIVE — AB
Urobilinogen, UA: 0.2 mg/dL (ref 0.0–1.0)
pH: 7 (ref 5.0–8.0)

## 2012-05-28 LAB — URINE MICROSCOPIC-ADD ON

## 2012-05-28 LAB — POCT I-STAT, CHEM 8
Calcium, Ion: 1.2 mmol/L (ref 1.12–1.32)
Chloride: 108 mEq/L (ref 96–112)
Glucose, Bld: 82 mg/dL (ref 70–99)
HCT: 39 % (ref 36.0–46.0)
TCO2: 20 mmol/L (ref 0–100)

## 2012-05-28 LAB — HCG, QUANTITATIVE, PREGNANCY: hCG, Beta Chain, Quant, S: 8808 m[IU]/mL — ABNORMAL HIGH (ref <5)

## 2012-05-28 MED ORDER — ONDANSETRON HCL 4 MG PO TABS: 4.0000 mg | ORAL_TABLET | Freq: Three times a day (TID) | ORAL | Status: AC | PRN

## 2012-05-28 MED ORDER — ONDANSETRON HCL 4 MG/2ML IJ SOLN
4.0000 mg | INTRAMUSCULAR | Status: DC | PRN
  Administered 2012-05-28: 4 mg via INTRAVENOUS
  Filled 2012-05-28: qty 2

## 2012-05-28 MED ORDER — PRENATAL COMPLETE 14-0.4 MG PO TABS: 1.0000 | ORAL_TABLET | Freq: Every day | ORAL | Status: DC

## 2012-05-28 MED ORDER — CEPHALEXIN 500 MG PO CAPS: 500.0000 mg | ORAL_CAPSULE | Freq: Four times a day (QID) | ORAL | Status: AC

## 2012-05-28 MED ORDER — ACETAMINOPHEN 500 MG PO TABS
1000.0000 mg | ORAL_TABLET | Freq: Once | ORAL | Status: AC
  Administered 2012-05-28: 1000 mg via ORAL
  Filled 2012-05-28: qty 2

## 2012-05-28 MED ORDER — SODIUM CHLORIDE 0.9 % IV SOLN
INTRAVENOUS | Status: DC
  Administered 2012-05-28 (×2): via INTRAVENOUS

## 2012-05-28 MED ORDER — DEXTROSE 5 % IV SOLN
1.0000 g | Freq: Once | INTRAVENOUS | Status: AC
  Administered 2012-05-28: 1 g via INTRAVENOUS
  Filled 2012-05-28: qty 10

## 2012-05-28 NOTE — Discharge Instructions (Signed)
RESOURCE GUIDE  Chronic Pain Problems: Contact [facility]  [phone] Patients need to be referred by their primary care doctor.  Insufficient Money for Medicine: Contact United Way:  call "211" or [facility] [phone].  No Primary Care Doctor: - Call [facility]  [phone] - can help you locate a primary care doctor that  accepts your insurance, provides certain services, etc. - Physician Referral Service- 1-800-533-3463  Agencies that provide inexpensive medical care: - [facility]  [phone] - [facility]  [phone] - [facility]  [phone] - [facility]  [phone] - Planned Parenthood  [phone] - [facility]  [phone]  Medicaid-accepting [place] Providers: - [facility]- [address]  [phone], Mon-Fri 9am-7pm, Sat 9am-1pm - [facility]- [address], [address]  [phone] - [facility]- [address], [address]  [phone] - [facility]- [address] [place] [address]  [phone] - Veita Bland- [address], [address], [phone]  Only accepts [place] Access Medicaid patients after they have their name  applied to their card  Self Pay (no insurance) in [place]: - Sickle Cell Patients: Dr Eric Dean, [facility]  [address], [phone] - [facility]- [address]  [phone]       -     [facility] [place] [facility]- [address]       -     [facility]- see information above (Speak to Pam H if you do not have insurance)       -  [facility]- [address], [phone]       -  [facility] [place]- [address],  [phone]       -  [facility]- [address] [place] [address], [phone]       -  Dr Osei-Bonsu-  [address], [place], [phone]       -  [facility]- [address], [phone]       -  [facility]- [address] [place] [address], [phone], also [address], [phone]       -    [facility]- [address], [phone], 1st & 3rd Saturday   every month, 10am-1pm  1) Find a Doctor and Pay Out of Pocket Although you won't have to find out who is covered by your insurance plan, it is a good idea to ask around and get recommendations. You will then need to call the office and see if the doctor you have chosen will accept you as a new patient and what types of options they offer for patients who are self-pay. Some doctors offer discounts or will set up payment plans for their patients who do not have insurance, but you will need to ask so you aren't surprised when you get to your appointment.  2) Contact Your Local Health Department Not all health departments have doctors that can see patients for sick visits, but many do, so it is worth a call to see if yours does. If you don't know where your local health department is, you can check in your phone book. The CDC also has a tool to help you locate your state's health department, and many state websites also have   listings of all of their local health departments.  3) Find a Walk-in Clinic If your illness is not likely to be very severe or complicated, you may want to try a walk in clinic. These are popping up all over the country in pharmacies, drugstores, and shopping centers. They're usually staffed by nurse practitioners or physician assistants that have been trained to treat common illnesses and complaints. They're usually fairly quick and inexpensive. However, if you have serious medical issues or chronic medical problems, these are probably not your best option  STD Testing - [place] [facility] [place], [facility] Clinic, [address], [place], phone [phone] or [phone].  Monday - Friday, call for an appointment. - [place]  [facility] [place], [facility]D Clinic, [address], [place], phone [phone] or 1-877-539-9860.  Monday - Friday, call for an appointment.  Abuse/Neglect: - [place] Child Abuse Hotline (336) 641-3795 - [place] Child Abuse Hotline 800-378-5315 (After Hours)  Emergency Shelter:  [facility] (336) 271-5985  Maternity Homes: - [facility] (336) 275-9566 - [facility] (704) 372-4663  MRSA Hotline #:   832-7006  [place] Resources  [facility] [place]  [employer] [place] [facility] [address]                 [address]         [address]  [place]                                               [place]                              [place] Phone:  [phone]                                  Phone:  [phone]                   Phone:  [phone]  [place] [facility], [phone] - [place] Services - [facility]- 1-888-581-9988       -     [facility] in [place], [address],                                  336-349-4454, Insurance  [place] Child Abuse Hotline (336) 342-1394 or (336) 342-3537 (After Hours)   Behavioral Health Services  Substance Abuse Resources: - [facility]  336-882-2125 - [facility] 336-784-9470 - [facility] 336-285-9073 - [facility] 336-845-3988 - Residential & Outpatient Substance Abuse Program  800-659-3381  Psychological Services: -  Health  [phone] - [facility]  [phone] - [place] [facility], [address], [place], ACCESS LINE: 1-800-853-5163 or 336-641-4981, Http://www.[facility].com/services/adult.htm  Dental Assistance  If unable to pay or uninsured, contact:  [facility] or [place] [facility] to become qualified for the adult dental  clinic.  Patients with Medicaid:  [facility] [address], [phone], [phone]  If unable   to pay, or uninsured, contact [facility] (949) 285-8071) or [facility] Department (720)568-2412 in [place], [phone] in [facility]) to become qualified for the adult dental clinic  Other Low-Cost Community Dental Services: - [facility]- [address] [place], [place], [place], [address], [phone], [phone], 2nd and 4th Thursday of the month at 6:30am.  10 clients each day by appointment, can sometimes see walk-in patients if someone does not show for an appointment. [facility]- [address] [place], [place], [address], [phone] - [facility]- [address], [place], [place], [address], [phone] [facility]- [phone] [facility] Health Department- [phone] [facility] [phone]     Take the prescriptions as directed.  Call your regular OB/GYN doctor today to schedule a follow up appointment this week.  Return to the Emergency Department immediately sooner if worsening.

## 2012-05-28 NOTE — ED Provider Notes (Signed)
History   This chart was scribed for Laray Anger, DO by Sofie Rower. The patient was seen in room APA08/APA08 and the patient's care was started at 11:40 AM     CSN: [id number]  Arrival date & time 05/28/12  1055   First MD Initiated Contact with Patient 05/28/12 1122      Chief Complaint  Patient presents with  . Flank Pain     HPI  Stacy Peterson is a 21 y.o. female who presents to the Emergency Department complaining of gradual onset and persistence of constant right sided flank "pain" that began this morning.  Has been associated with nausea, urinary frequency and "pressure" when urinating for the past 3 days.  Pain occasionally radiates into the right side of her torso/abd area.  Modifying factors include taking OTC cranberry tablets which provide moderate relief.  Pt denies vaginal bleeding/discharge, denies hx of kidney stones.  Denies fevers, no rash, no vomiting/diarrhea, no CP/SOB.    Past Medical History  Diagnosis Date  . Shingles     Past Surgical History  Procedure Date  . Knee surgery     History  Substance Use Topics  . Smoking status: Current Some Day Smoker -- 0.2 packs/day    Types: Cigarettes  . Smokeless tobacco: Never Used  . Alcohol Use: No    OB History    Grav Para Term Preterm Abortions TAB SAB Ect Mult Living   3 2 2  1 1    2       Review of Systems ROS: Statement: All systems negative except as marked or noted in the HPI; Constitutional: Negative for fever and chills. ; ; Eyes: Negative for eye pain, redness and discharge. ; ; ENMT: Negative for ear pain, hoarseness, nasal congestion, sinus pressure and sore throat. ; ; Cardiovascular: Negative for chest pain, palpitations, diaphoresis, dyspnea and peripheral edema. ; ; Respiratory: Negative for cough, wheezing and stridor. ; ; Gastrointestinal: Negative for nausea, vomiting, diarrhea, abdominal pain, blood in stool, hematemesis, jaundice and rectal bleeding. . ; ; Genitourinary: +dysuria,  flank pain and negative for hematuria. ; GYN:  No vaginal bleeding, no vaginal discharge, no vulvar pain.; ; Musculoskeletal: Negative for back pain and neck pain. Negative for swelling and trauma.; ; Skin: Negative for pruritus, rash, abrasions, blisters, bruising and skin lesion.; ; Neuro: Negative for headache, lightheadedness and neck stiffness. Negative for weakness, altered level of consciousness , altered mental status, extremity weakness, paresthesias, involuntary movement, seizure and syncope.     Allergies  Review of patient's allergies indicates no known allergies.  Home Medications   Current Outpatient Rx  Name Route Sig Dispense Refill  . CRANBERRY PO Oral Take 2 capsules by mouth daily.      BP 123/61  Pulse 86  Temp(Src) 98.8 F (37.1 C) (Oral)  Resp 16  Ht 5\' 4"  (1.626 m)  Wt 115 lb (52.164 kg)  BMI 19.74 kg/m2  SpO2 98%  LMP 05/16/2012  Physical Exam 1145: Physical examination:  Nursing notes reviewed; Vital signs and O2 SAT reviewed;  Constitutional: Well developed, Well nourished, Well hydrated, Tearful; Head:  Normocephalic, atraumatic; Eyes: EOMI, PERRL, No scleral icterus; ENMT: Mouth and pharynx normal, Mucous membranes moist; Neck: Supple, Full range of motion, No lymphadenopathy; Cardiovascular: Regular rate and rhythm, No murmur, rub, or gallop; Respiratory: Breath sounds clear & equal bilaterally, No rales, rhonchi, wheezes, or rub, Normal respiratory effort/excursion; Chest: Nontender, Movement normal; Abdomen: Soft, +TTP right side of torso, Nondistended, Normal bowel  sounds; Genitourinary: No CVA tenderness; Spine:  No midline CS, TS, LS tenderness.  +TTP right lumbar paraspinal muscles.; Extremities: Pulses normal, No tenderness, No edema, No calf edema or asymmetry.; Neuro: AA&Ox3, Major CN grossly intact.  No gross focal motor or sensory deficits in extremities.; Skin: Color normal, Warm, Dry, no rash.   ED Course  Procedures    MDM  MDM Reviewed:  nursing note, vitals and previous chart Interpretation: labs and ultrasound   Results for orders placed during the hospital encounter of 05/28/12  URINALYSIS, ROUTINE W REFLEX MICROSCOPIC      Component Value Range   Color, Urine YELLOW  YELLOW    APPearance CLEAR  CLEAR    Specific Gravity, Urine 1.015  1.005 - 1.030    pH 7.0  5.0 - 8.0    Glucose, UA NEGATIVE  NEGATIVE (mg/dL)   Hgb urine dipstick TRACE (*) NEGATIVE    Bilirubin Urine NEGATIVE  NEGATIVE    Ketones, ur 40 (*) NEGATIVE (mg/dL)   Protein, ur TRACE (*) NEGATIVE (mg/dL)   Urobilinogen, UA 0.2  0.0 - 1.0 (mg/dL)   Nitrite POSITIVE (*) NEGATIVE    Leukocytes, UA TRACE (*) NEGATIVE   PREGNANCY, URINE      Component Value Range   Preg Test, Ur POSITIVE (*) NEGATIVE   URINE MICROSCOPIC-ADD ON      Component Value Range   Squamous Epithelial / LPF FEW (*) RARE    WBC, UA 3-6  <3 (WBC/hpf)   RBC / HPF 3-6  <3 (RBC/hpf)   Bacteria, UA MANY (*) RARE   HCG, QUANTITATIVE, PREGNANCY      Component Value Range   hCG, Beta Chain, Quant, S 8808 (*) <5 (mIU/mL)  POCT I-STAT, CHEM 8      Component Value Range   Sodium 140  135 - 145 (mEq/L)   Potassium 3.5  3.5 - 5.1 (mEq/L)   Chloride 108  96 - 112 (mEq/L)   BUN 5 (*) 6 - 23 (mg/dL)   Creatinine, Ser 1.61  0.50 - 1.10 (mg/dL)   Glucose, Bld 82  70 - 99 (mg/dL)   Calcium, Ion 0.96  1.12 - 1.32 (mmol/L)   TCO2 20  0 - 100 (mmol/L)   Hemoglobin 13.3  12.0 - 15.0 (g/dL)   HCT 04.5  40.9 - 81.1 (%)   US Ob Comp Less 14 Wks 05/28/2012  *RADIOLOGY REPORT*  Clinical Data:  Positive pregnancy test, right pelvic pain, question ectopic pregnancy or ovarian torsion  OBSTETRIC <14 WK [place] AND TRANSVAGINAL OB US DOPPLER ULTRASOUND OF OVARIES  Technique:  Both transabdominal and transvaginal ultrasound examinations were performed for complete evaluation of the gestation as well as the maternal uterus, adnexal regions, and pelvic cul-de-sac.  Transvaginal technique was performed to assess  early pregnancy.  Color and duplex Doppler ultrasound was utilized to evaluate blood flow to the ovaries.  Comparison:  03/28/2011  Intrauterine gestational sac: Visualized/normal in shape. Yolk sac:  Present Embryo:  Present Cardiac Activity:  Present Heart Rate:  124bpm  CRL:  2.33mm     5 w  6 d            [place] EDC:  01/20/2013  Maternal uterus/adnexae: No subchorionic hemorrhage. Right ovary measures 3.1 x 2.1 x 3.0 cm in size and contains a small corpus luteum. Left ovary measures 2.9 x 2.2 x 3.0 cm in size and demonstrates normal morphology. Blood flow present within both ovaries on color Doppler imaging. No free pelvic  fluid or adnexal masses.  Pulsed Doppler evaluation demonstrates normal appearing low resistance arterial and venous waveforms.  IMPRESSION: Single live intrauterine gestation measured at 5-week 6 days EGA by crown-rump length. No other intrapelvic abnormalities identified. Specifically, no sonographic evidence for ovarian torsion.  Original Report Authenticated By: Lollie Marrow, M.D.      3:46 PM:  Pt talking on telephone most of her ED visit.  Has tol PO well without N/V.  Tylenol given for pain.  +UTI, UC pending.  First dose of abx given IV, will give rx on discharge.  Dx testing d/w pt.  Questions answered.  Verb understanding, agreeable to d/c home with outpt f/u.       I personally performed the services described in this documentation, which was scribed in my presence. The recorded information has been reviewed and considered. Nyelli Samara Allison Quarry, DO 05/31/12 1331

## 2012-05-28 NOTE — ED Notes (Signed)
Pt c/o right flank pain since this am. Pt states that she has felt pressure like she needs to urinate and has urinary frequency. Pt also c/o nausea. Crying in triage.

## 2012-05-30 LAB — URINE CULTURE: Culture  Setup Time: 201306050149

## 2012-05-31 NOTE — ED Notes (Signed)
+  Urine. Patient treated with Keflex. Sensitive to same. Per protocol MD. °

## 2013-04-18 ENCOUNTER — Emergency Department (HOSPITAL_COMMUNITY)
Admission: EM | Admit: 2013-04-18 | Discharge: 2013-04-18 | Disposition: A | Discharge status: Home / Self Care | Payer: Self-pay | Attending: Emergency Medicine | Admitting: Emergency Medicine

## 2013-04-18 ENCOUNTER — Encounter (HOSPITAL_COMMUNITY): Payer: Self-pay | Admitting: Emergency Medicine

## 2013-04-18 ENCOUNTER — Emergency Department (HOSPITAL_COMMUNITY): Payer: Self-pay

## 2013-04-18 DIAGNOSIS — N898 Other specified noninflammatory disorders of vagina: Secondary | ICD-10-CM | POA: Insufficient documentation

## 2013-04-18 DIAGNOSIS — R509 Fever, unspecified: Secondary | ICD-10-CM | POA: Insufficient documentation

## 2013-04-18 DIAGNOSIS — F17200 Nicotine dependence, unspecified, uncomplicated: Secondary | ICD-10-CM | POA: Insufficient documentation

## 2013-04-18 DIAGNOSIS — R197 Diarrhea, unspecified: Secondary | ICD-10-CM | POA: Insufficient documentation

## 2013-04-18 DIAGNOSIS — N739 Female pelvic inflammatory disease, unspecified: Secondary | ICD-10-CM | POA: Insufficient documentation

## 2013-04-18 DIAGNOSIS — Z3202 Encounter for pregnancy test, result negative: Secondary | ICD-10-CM | POA: Insufficient documentation

## 2013-04-18 DIAGNOSIS — R300 Dysuria: Secondary | ICD-10-CM | POA: Insufficient documentation

## 2013-04-18 DIAGNOSIS — R109 Unspecified abdominal pain: Secondary | ICD-10-CM | POA: Insufficient documentation

## 2013-04-18 DIAGNOSIS — R000 Tachycardia, unspecified: Secondary | ICD-10-CM | POA: Insufficient documentation

## 2013-04-18 DIAGNOSIS — IMO0002 Reserved for concepts with insufficient information to code with codable children: Secondary | ICD-10-CM | POA: Insufficient documentation

## 2013-04-18 DIAGNOSIS — R112 Nausea with vomiting, unspecified: Secondary | ICD-10-CM | POA: Insufficient documentation

## 2013-04-18 DIAGNOSIS — A6000 Herpesviral infection of urogenital system, unspecified: Secondary | ICD-10-CM | POA: Insufficient documentation

## 2013-04-18 LAB — URINALYSIS, ROUTINE W REFLEX MICROSCOPIC
Bilirubin Urine: NEGATIVE
Ketones, ur: NEGATIVE mg/dL
Nitrite: NEGATIVE
Protein, ur: NEGATIVE mg/dL
pH: 7 (ref 5.0–8.0)

## 2013-04-18 LAB — WET PREP, GENITAL
Trich, Wet Prep: NONE SEEN
Yeast Wet Prep HPF POC: NONE SEEN

## 2013-04-18 LAB — COMPREHENSIVE METABOLIC PANEL
AST: 20 U/L (ref 0–37)
Albumin: 3.9 g/dL (ref 3.5–5.2)
Alkaline Phosphatase: 62 U/L (ref 39–117)
CO2: 24 mEq/L (ref 19–32)
Chloride: 103 mEq/L (ref 96–112)
Potassium: 3.6 mEq/L (ref 3.5–5.1)
Total Bilirubin: 0.2 mg/dL — ABNORMAL LOW (ref 0.3–1.2)

## 2013-04-18 LAB — URINE MICROSCOPIC-ADD ON

## 2013-04-18 LAB — CBC
HCT: 36.7 % (ref 36.0–46.0)
Platelets: 194 10*3/uL (ref 150–400)
RBC: 4.21 MIL/uL (ref 3.87–5.11)
RDW: 14 % (ref 11.5–15.5)
WBC: 5.9 10*3/uL (ref 4.0–10.5)

## 2013-04-18 MED ORDER — LIDOCAINE-EPINEPHRINE-TETRACAINE (LET) SOLUTION
3.0000 mL | Freq: Once | NASAL | Status: AC
  Administered 2013-04-18: 3 mL via TOPICAL
  Filled 2013-04-18: qty 3

## 2013-04-18 MED ORDER — LIDOCAINE-EPINEPHRINE-TETRACAINE (LET) TOPICAL GEL
3.0000 mL | Freq: Once | TOPICAL | Status: DC
  Filled 2013-04-18: qty 3

## 2013-04-18 MED ORDER — CEFTRIAXONE SODIUM 1 G IJ SOLR
1.0000 g | Freq: Once | INTRAMUSCULAR | Status: AC
  Administered 2013-04-18: 1 g via INTRAMUSCULAR
  Filled 2013-04-18: qty 10

## 2013-04-18 MED ORDER — LIDOCAINE HCL 1 % IJ SOLN
INTRAMUSCULAR | Status: AC
  Administered 2013-04-18: 0.9 mL
  Filled 2013-04-18: qty 20

## 2013-04-18 MED ORDER — DOXYCYCLINE HYCLATE 100 MG PO CAPS: 100.0000 mg | ORAL_CAPSULE | Freq: Two times a day (BID) | ORAL | Status: DC

## 2013-04-18 MED ORDER — LORAZEPAM 1 MG PO TABS
1.0000 mg | ORAL_TABLET | Freq: Once | ORAL | Status: AC
  Administered 2013-04-18: 1 mg via ORAL
  Filled 2013-04-18: qty 1

## 2013-04-18 MED ORDER — METRONIDAZOLE 500 MG PO TABS: 500.0000 mg | ORAL_TABLET | Freq: Two times a day (BID) | ORAL | Status: DC

## 2013-04-18 MED ORDER — LIDOCAINE-EPINEPHRINE-TETRACAINE (LET) TOPICAL GEL: 3.0000 mL | Freq: Once | TOPICAL | Status: DC

## 2013-04-18 MED ORDER — OXYCODONE-ACETAMINOPHEN 5-325 MG PO TABS
2.0000 | ORAL_TABLET | Freq: Once | ORAL | Status: AC
  Administered 2013-04-18: 2 via ORAL
  Filled 2013-04-18: qty 2

## 2013-04-18 MED ORDER — ACYCLOVIR 5 % EX OINT: TOPICAL_OINTMENT | CUTANEOUS | Status: DC

## 2013-04-18 MED ORDER — IOHEXOL 300 MG/ML  SOLN
100.0000 mL | Freq: Once | INTRAMUSCULAR | Status: AC | PRN
  Administered 2013-04-18: 100 mL via INTRAVENOUS

## 2013-04-18 MED ORDER — IOHEXOL 300 MG/ML  SOLN
50.0000 mL | Freq: Once | INTRAMUSCULAR | Status: AC | PRN
  Administered 2013-04-18: 50 mL via ORAL

## 2013-04-18 NOTE — ED Notes (Signed)
Pt presenting to ed with c/o vaginal blisters, and vaginal pain. Pt states positive nausea, vomiting and fever. Pt states she's been having unprotected sex with her partner

## 2013-04-18 NOTE — ED Provider Notes (Signed)
History     CSN: [id number]  Arrival date & time 04/18/13  1124   First MD Initiated Contact with Patient 04/18/13 1204      Chief Complaint  Patient presents with  . Vaginal Pain    (Consider location/radiation/quality/duration/timing/severity/associated sxs/prior treatment) HPI Comments: 22 y/o female presents to the emergency department complaining of vaginal pain, blisters and lower abdominal pain x 2 days. Patient states she had unprotected sex with a new partner a few days back, and gradually began not feeling well. Had unprotected sex again with him yesterday and states it "hurt really bad" and made him stop. Admits to subjective fever, chills, nausea and vomiting. This morning woke up with severe vaginal pain 10/10, looked with a mirror and noticed blisters on her vagina along with a large amount of brown discharge in her pants. Admits to dysuria. Very anxious and "feels like she is dying".   Patient is a 22 y.o. female presenting with vaginal pain. The history is provided by the patient.  Vaginal Pain Associated symptoms include abdominal pain, chills, a fever, nausea and vomiting. Pertinent negatives include no chest pain.    Past Medical History  Diagnosis Date  . Shingles     Past Surgical History  Procedure Laterality Date  . Knee surgery      No family history on file.  History  Substance Use Topics  . Smoking status: Current Some Day Smoker -- 0.25 packs/day    Types: Cigarettes  . Smokeless tobacco: Never Used  . Alcohol Use: No    OB History   Grav Para Term Preterm Abortions TAB SAB Ect Mult Living   3 2 2  1 1    2       Review of Systems  Constitutional: Positive for fever and chills.  Respiratory: Negative for shortness of breath.   Cardiovascular: Negative for chest pain.  Gastrointestinal: Positive for nausea, vomiting, abdominal pain and diarrhea. Negative for blood in stool.  Genitourinary: Positive for dysuria, vaginal discharge, vaginal  pain, pelvic pain and dyspareunia. Negative for vaginal bleeding and menstrual problem.  Psychiatric/Behavioral: The patient is nervous/anxious.   All other systems reviewed and are negative.    Allergies  Review of patient's allergies indicates no known allergies.  Home Medications  No current outpatient prescriptions on file.  BP 109/94  Pulse 119  Temp(Src) 98.9 F (37.2 C) (Oral)  Resp 18  SpO2 97%  LMP 03/31/2013  Physical Exam  Nursing note and vitals reviewed. Constitutional: She is oriented to person, place, and time. She appears well-developed and well-nourished.  Tearful, pacing, uncomfortable.  HENT:  Head: Normocephalic and atraumatic.  Mouth/Throat: Oropharynx is clear and moist.  Eyes: Conjunctivae are normal.  Neck: Normal range of motion. Neck supple.  Cardiovascular: Regular rhythm and normal heart sounds.  Tachycardia present.   Pulmonary/Chest: Effort normal and breath sounds normal. No respiratory distress.  Abdominal: Soft. Normal appearance and bowel sounds are normal. There is tenderness in the right lower quadrant, suprapubic area and left lower quadrant. There is guarding. There is no rigidity and no rebound.  Genitourinary: There is tenderness and lesion on the right labia. There is tenderness and lesion on the left labia. There is tenderness around the vagina. Vaginal discharge (thick, green/brown, malodorous) found.  Multiple vesicular lesions along labia majora and minora, perineal region. Unable to perform speculum exam due to severe pain.  Musculoskeletal: Normal range of motion. She exhibits no edema.  Neurological: She is alert and oriented to person,  place, and time.  Skin: Skin is warm and dry. She is not diaphoretic.  Psychiatric: Her behavior is normal. Her mood appears anxious.  Tearful.    ED Course  Procedures (including critical care time)  Labs Reviewed  WET PREP, GENITAL - Abnormal; Notable for the following:    Clue Cells Wet  Prep HPF POC FEW (*)    WBC, Wet Prep HPF POC MODERATE (*)    All other components within normal limits  URINALYSIS, ROUTINE W REFLEX MICROSCOPIC - Abnormal; Notable for the following:    APPearance CLOUDY (*)    Hgb urine dipstick MODERATE (*)    Leukocytes, UA MODERATE (*)    All other components within normal limits  COMPREHENSIVE METABOLIC PANEL - Abnormal; Notable for the following:    Glucose, Bld 109 (*)    BUN 5 (*)    Total Bilirubin 0.2 (*)    All other components within normal limits  URINE MICROSCOPIC-ADD ON - Abnormal; Notable for the following:    Squamous Epithelial / LPF FEW (*)    Bacteria, UA FEW (*)    All other components within normal limits  GC/CHLAMYDIA PROBE AMP  URINE CULTURE  CBC  POCT PREGNANCY, URINE   Ct Abdomen Pelvis W Contrast  04/18/2013  *RADIOLOGY REPORT*  Clinical Data: Pelvic and vaginal pain.  Nausea, vomiting and fever  CT ABDOMEN AND PELVIS WITH CONTRAST  Technique:  Multidetector CT imaging of the abdomen and pelvis was performed following the standard protocol during bolus administration of intravenous contrast.  Contrast: 50mL OMNIPAQUE IOHEXOL 300 MG/ML  SOLN, OMNIPAQUE IOHEXOL 300 MG/ML  SOLN  Comparison: CT 07/26/2009  Findings: Lung bases are clear.  No pleural or pericardial fluid.  The liver has a normal appearance without focal lesions or biliary ductal dilatation.  No calcified gallstones.  The spleen is normal. The pancreas is normal.  The adrenal glands are normal.  The kidneys are normal.  The aorta and IVC are normal.  No retroperitoneal mass or adenopathy.  No free peritoneal fluid or air.  The appendix appears normal.  Uterus and adnexal regions appear normal.  No bladder abnormality is seen.  No significant bony finding.  IMPRESSION: Normal CT scan of the abdomen and pelvis.   Original Report Authenticated By: Paulina Fusi, M.D.      1. PID (pelvic inflammatory disease)   2. Genital herpes       MDM  Genital herpes along  with possible PID. Severe tenderness around and in vagina. LET solution given which helped relieve pain a little, not enough to insert speculum. Cultures obtained without speculum. Large amount of foul smelling discharge present. Obtaining CT scan, cannot obtain pelvic US due to discomfort. Percocet is helping with pain and ativan calmed her down. No longer tearful. 3:25 PM CT scan normal. Patient still comfortable at this time. Discharged with doxy/flagyl for PID, prophylactic gc/chlamydia, 1g IM rocephin. Zovirax cream for genital herpes. Return precautions discussed. [facility]. Patient states understanding of plan and is agreeable.       Trevor Mace, PA-C 04/18/13 1528

## 2013-04-19 LAB — URINE CULTURE

## 2013-04-19 NOTE — ED Provider Notes (Signed)
Medical screening examination/treatment/procedure(s) were performed by non-physician practitioner and as supervising physician I was immediately available for consultation/collaboration.   Laray Anger, DO 04/19/13 1058

## 2013-04-20 ENCOUNTER — Telehealth (HOSPITAL_COMMUNITY): Payer: Self-pay | Admitting: Emergency Medicine

## 2013-06-01 IMAGING — CT CT ABD-PELV W/ CM
1 of 2 series · 15 of 32 positions shown, 19 images · IV contrast (OMNIPAQUE 300)
Comparison: CT 07/26/2009

CLINICAL DATA: Pelvic and vaginal pain.  Nausea, vomiting and fever

CT ABDOMEN AND PELVIS WITH CONTRAST
TECHNIQUE: Multidetector CT imaging of the abdomen and pelvis was
performed following the standard protocol during bolus
administration of intravenous contrast.
Contrast: 50mL OMNIPAQUE IOHEXOL 300 MG/ML  SOLN, 100mL OMNIPAQUE
IOHEXOL 300 MG/ML  SOLN

[Series 2: abd/pel with · axial · 0.67mm/px · z∈[-602,-172]mm · 15 of 94 slices shown, 19 images]
[im 4/94  soft-tissue]
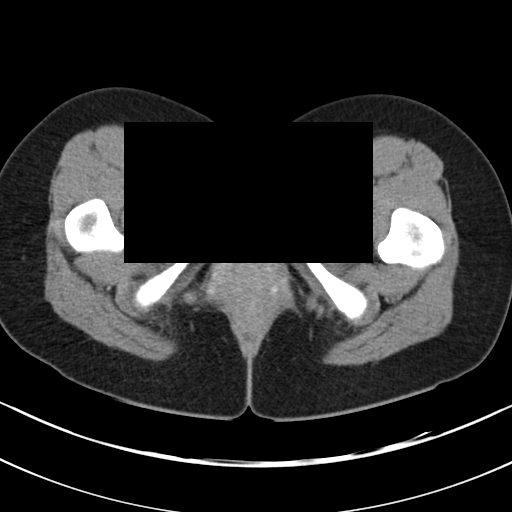
[im 4/94  bone]
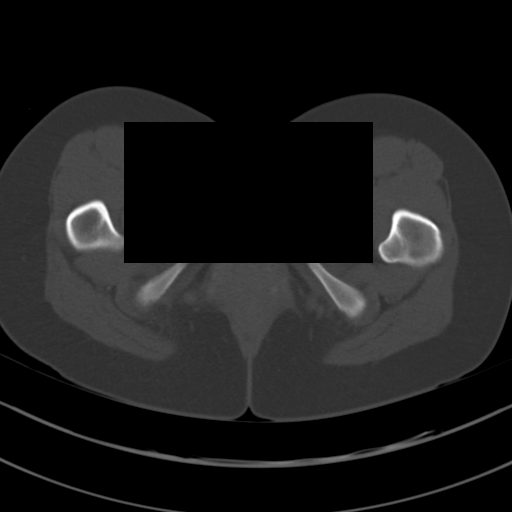
[im 12/94  soft-tissue]
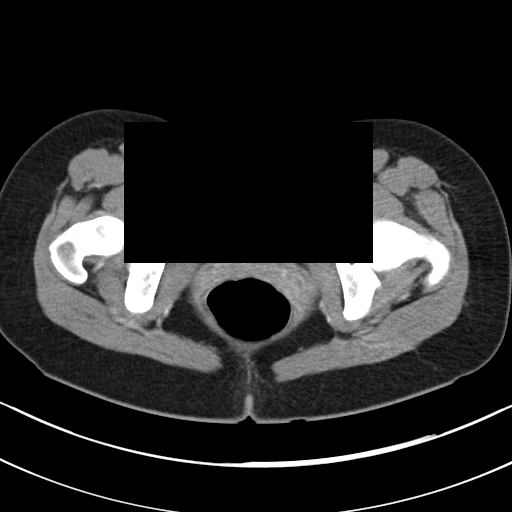
[im 20/94  soft-tissue]
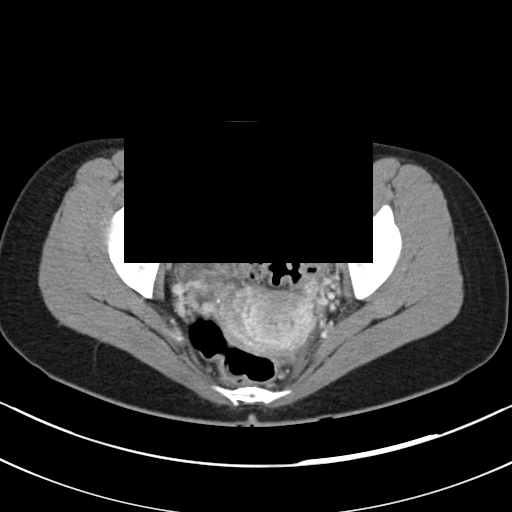
[im 28/94  soft-tissue]
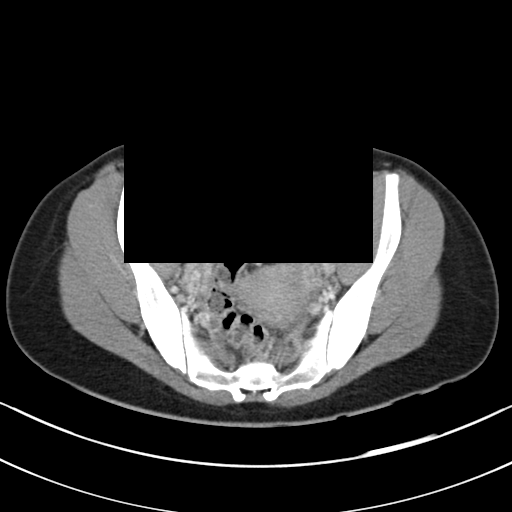
[im 32/94  soft-tissue]
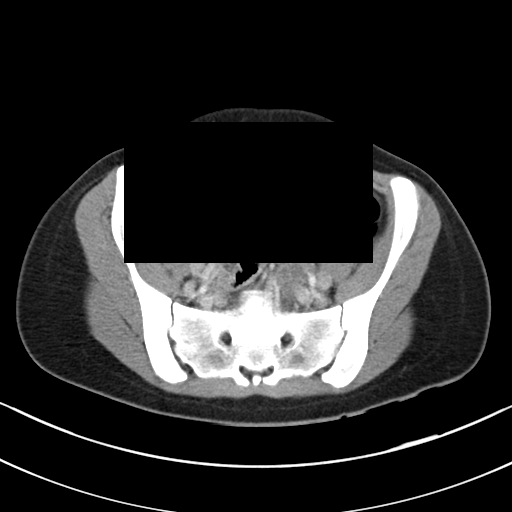
[im 39/94  soft-tissue]
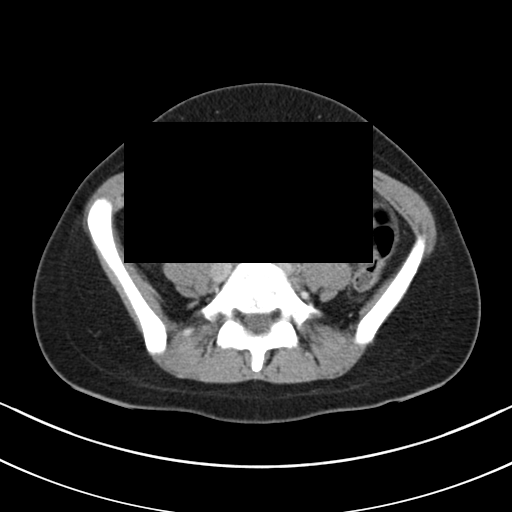
[im 47/94  soft-tissue]
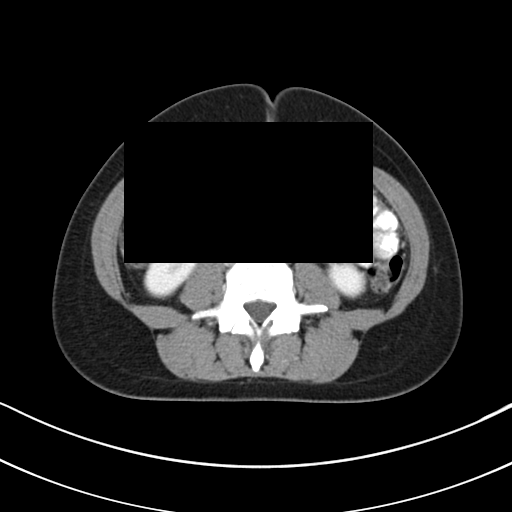
[im 55/94  soft-tissue]
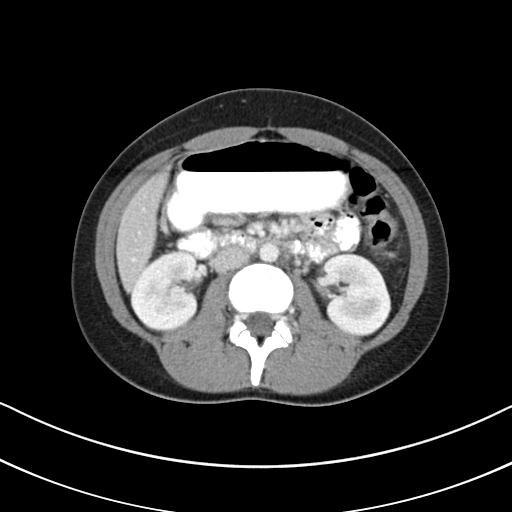
[im 63/94  soft-tissue]
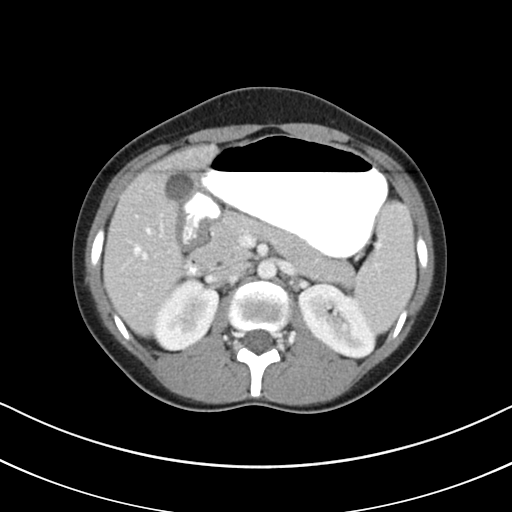
[im 63/94  bone]
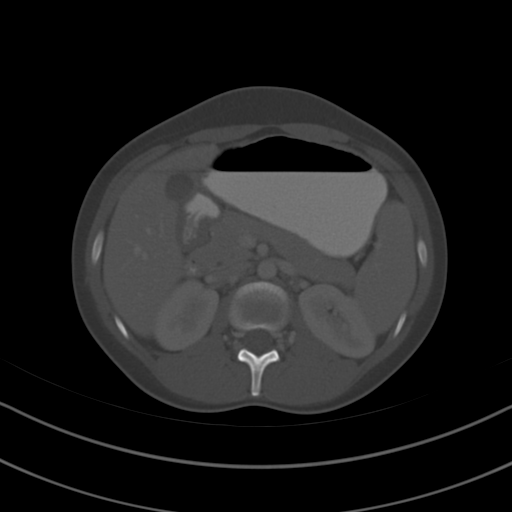
[im 66/94  soft-tissue]
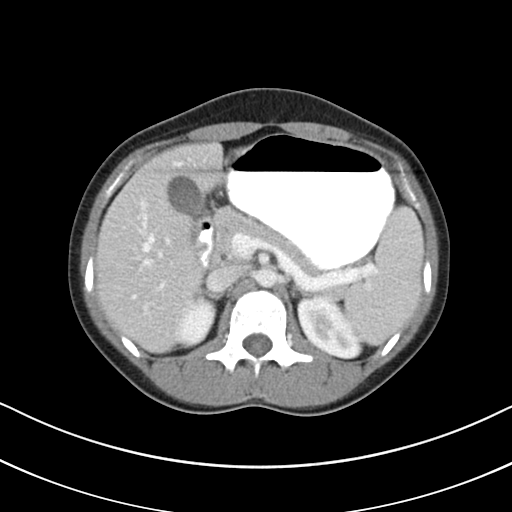
[im 74/94  soft-tissue]
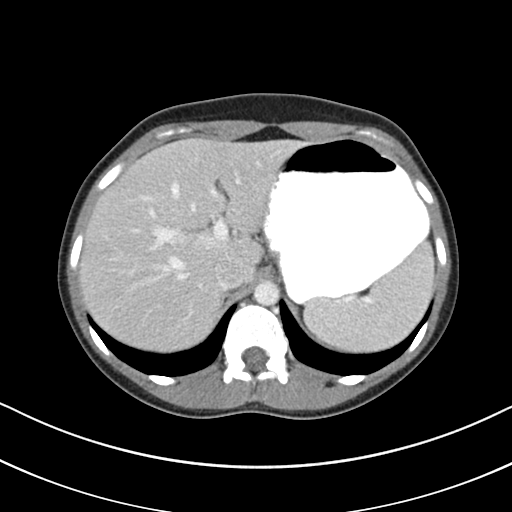
[im 78/94  lung]
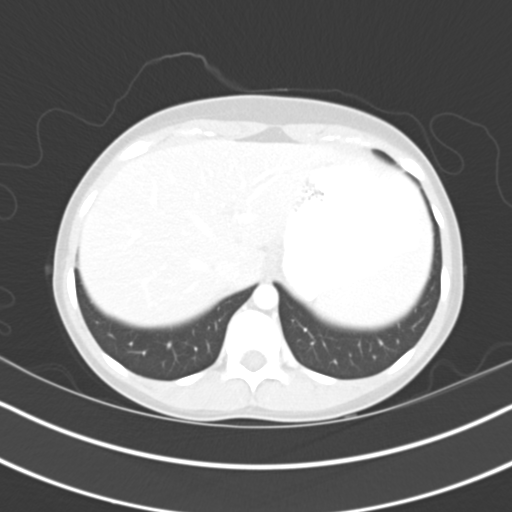
[im 82/94  soft-tissue]
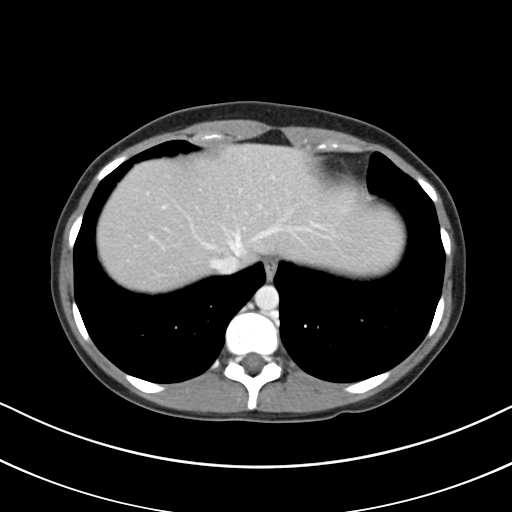
[im 82/94  lung]
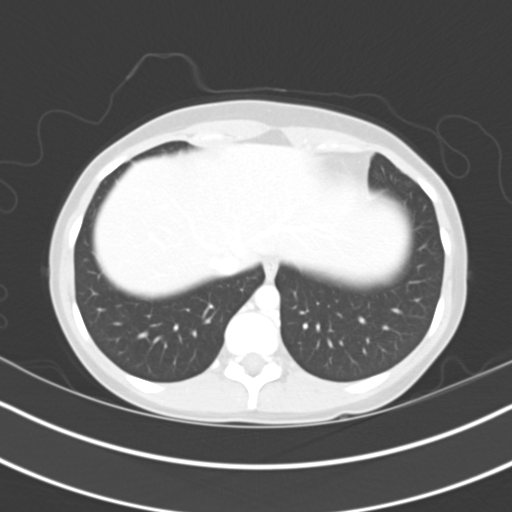
[im 86/94  lung]
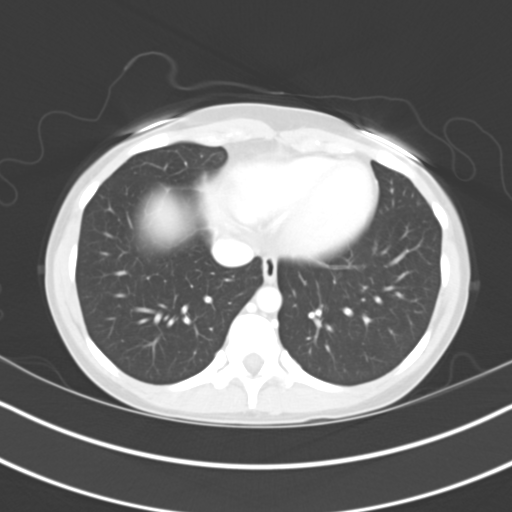
[im 90/94  soft-tissue]
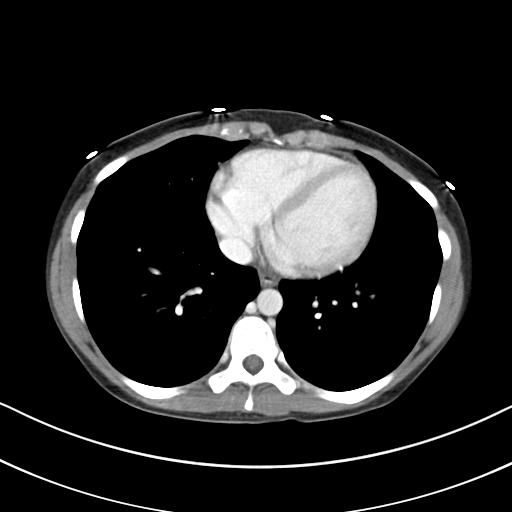
[im 90/94  lung]
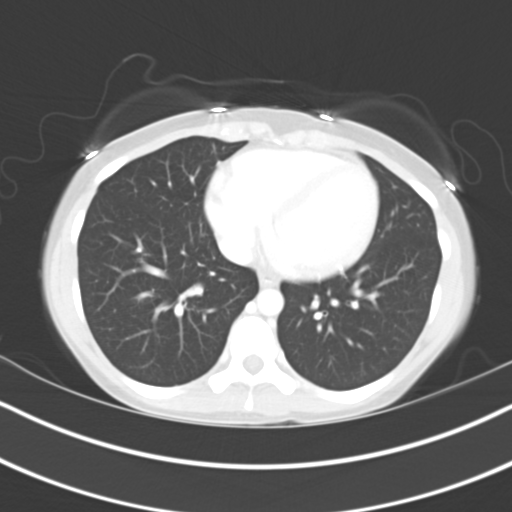

[15 of 32 positions shown; findings below may reference images not displayed]

FINDINGS: Lung bases are clear.  No pleural or pericardial fluid.

The liver has a normal appearance without focal lesions or biliary
ductal dilatation.  No calcified gallstones.  The spleen is normal.
The pancreas is normal.  The adrenal glands are normal.  The
kidneys are normal.  The aorta and IVC are normal.  No
retroperitoneal mass or adenopathy.  No free peritoneal fluid or
air.  The appendix appears normal.  Uterus and adnexal regions
appear normal.  No bladder abnormality is seen.  No significant
bony finding.
IMPRESSION: Normal CT scan of the abdomen and pelvis.

## 2013-09-08 ENCOUNTER — Inpatient Hospital Stay (HOSPITAL_COMMUNITY)
Admission: EM | Admit: 2013-09-08 | Discharge: 2013-09-11 | DRG: 690 | Disposition: A | Discharge status: Home / Self Care | Payer: Self-pay | Attending: Internal Medicine | Admitting: Internal Medicine

## 2013-09-08 ENCOUNTER — Encounter (HOSPITAL_COMMUNITY): Payer: Self-pay | Admitting: Family Medicine

## 2013-09-08 DIAGNOSIS — IMO0001 Reserved for inherently not codable concepts without codable children: Secondary | ICD-10-CM | POA: Diagnosis present

## 2013-09-08 DIAGNOSIS — B9689 Other specified bacterial agents as the cause of diseases classified elsewhere: Secondary | ICD-10-CM | POA: Diagnosis present

## 2013-09-08 DIAGNOSIS — I959 Hypotension, unspecified: Secondary | ICD-10-CM | POA: Diagnosis present

## 2013-09-08 DIAGNOSIS — N760 Acute vaginitis: Secondary | ICD-10-CM | POA: Diagnosis present

## 2013-09-08 DIAGNOSIS — A499 Bacterial infection, unspecified: Secondary | ICD-10-CM | POA: Diagnosis present

## 2013-09-08 DIAGNOSIS — F17200 Nicotine dependence, unspecified, uncomplicated: Secondary | ICD-10-CM | POA: Diagnosis present

## 2013-09-08 DIAGNOSIS — D72829 Elevated white blood cell count, unspecified: Secondary | ICD-10-CM | POA: Diagnosis present

## 2013-09-08 DIAGNOSIS — N10 Acute tubulo-interstitial nephritis: Principal | ICD-10-CM | POA: Diagnosis present

## 2013-09-08 DIAGNOSIS — E860 Dehydration: Secondary | ICD-10-CM | POA: Diagnosis present

## 2013-09-08 LAB — URINALYSIS, ROUTINE W REFLEX MICROSCOPIC
Glucose, UA: NEGATIVE mg/dL
Nitrite: POSITIVE — AB
Protein, ur: 30 mg/dL — AB
Urobilinogen, UA: 1 mg/dL (ref 0.0–1.0)

## 2013-09-08 LAB — CBC WITH DIFFERENTIAL/PLATELET
Basophils Relative: 0 % (ref 0–1)
Eosinophils Absolute: 0 10*3/uL (ref 0.0–0.7)
HCT: 37.6 % (ref 36.0–46.0)
Hemoglobin: 12.7 g/dL (ref 12.0–15.0)
MCH: 30.2 pg (ref 26.0–34.0)
MCHC: 33.8 g/dL (ref 30.0–36.0)
Monocytes Absolute: 1.5 10*3/uL — ABNORMAL HIGH (ref 0.1–1.0)
Monocytes Relative: 11 % (ref 3–12)

## 2013-09-08 LAB — POCT PREGNANCY, URINE: Preg Test, Ur: NEGATIVE

## 2013-09-08 LAB — URINE MICROSCOPIC-ADD ON

## 2013-09-08 LAB — COMPREHENSIVE METABOLIC PANEL
Albumin: 4.2 g/dL (ref 3.5–5.2)
BUN: 9 mg/dL (ref 6–23)
Chloride: 100 mEq/L (ref 96–112)
Creatinine, Ser: 0.62 mg/dL (ref 0.50–1.10)
GFR calc Af Amer: >90 mL/min (ref >90)
Total Bilirubin: 0.6 mg/dL (ref 0.3–1.2)

## 2013-09-08 MED ORDER — LORAZEPAM 1 MG PO TABS
1.0000 mg | ORAL_TABLET | Freq: Once | ORAL | Status: AC
  Administered 2013-09-08: 1 mg via ORAL
  Filled 2013-09-08: qty 1

## 2013-09-08 MED ORDER — HYDROMORPHONE HCL PF 1 MG/ML IJ SOLN
1.0000 mg | Freq: Once | INTRAMUSCULAR | Status: AC
  Administered 2013-09-08: 1 mg via INTRAVENOUS
  Filled 2013-09-08: qty 1

## 2013-09-08 MED ORDER — KETOROLAC TROMETHAMINE 30 MG/ML IJ SOLN
30.0000 mg | Freq: Once | INTRAMUSCULAR | Status: AC
  Administered 2013-09-08: 30 mg via INTRAVENOUS
  Filled 2013-09-08: qty 1

## 2013-09-08 MED ORDER — SODIUM CHLORIDE 0.9 % IV BOLUS (SEPSIS)
1000.0000 mL | INTRAVENOUS | Status: AC
  Administered 2013-09-08: 1000 mL via INTRAVENOUS

## 2013-09-08 MED ORDER — ONDANSETRON HCL 4 MG/2ML IJ SOLN
4.0000 mg | Freq: Once | INTRAMUSCULAR | Status: AC
  Administered 2013-09-08: 4 mg via INTRAVENOUS
  Filled 2013-09-08: qty 2

## 2013-09-08 MED ORDER — DEXTROSE 5 % IV SOLN
1.0000 g | Freq: Once | INTRAVENOUS | Status: AC
  Administered 2013-09-08: 1 g via INTRAVENOUS
  Filled 2013-09-08: qty 10

## 2013-09-08 MED ORDER — SODIUM CHLORIDE 0.9 % IV SOLN
Freq: Once | INTRAVENOUS | Status: AC
  Administered 2013-09-08: 22:00:00 via INTRAVENOUS

## 2013-09-08 NOTE — ED Notes (Signed)
Patient states she started having strong odor to her urine 2 days ago. Today, started having right flank pain and lower abdominal pain. Reports having nausea, vomiting and dysuria. Positive right CVA tenderness.

## 2013-09-08 NOTE — ED Provider Notes (Signed)
CSN: [id number]     Arrival date & time 09/08/13  1927 History   First MD Initiated Contact with Patient 09/08/13 1947     Chief Complaint  Patient presents with  . Flank Pain  . Emesis   (Consider location/radiation/quality/duration/timing/severity/associated sxs/prior Treatment) HPI Pt is a 22yo female c/o right sided flank pain that is constant, sharp stabbing, 9/10 pain that started around 12pm today.  Reports she started to have a strong odor to her urine 2 days ago but pain nausea, and right sided weakness and pain started today, gradually worsening.  Also c/o nausea with 2 episodes of NBNB emesis.  Pt's mother told her to come to ED after she vomited twice. Denies fevers but states when pain started she did have chills.  Has not taken any pain medication PTA. Also reports gradual onset of right sided headache that started after she got into the ED, also stated she felt like she was going to die because her heart was pounding and racing. Denies significant PMH including cardiopulmonary disease, diabetes, or previous anxiety attacks.  Denies drug use.    Past Medical History  Diagnosis Date  . Shingles    Past Surgical History  Procedure Laterality Date  . Knee surgery    . Knee surgery     No family history on file. History  Substance Use Topics  . Smoking status: Current Some Day Smoker -- 0.50 packs/day    Types: Cigarettes  . Smokeless tobacco: Never Used  . Alcohol Use: No   OB History   Grav Para Term Preterm Abortions TAB SAB Ect Mult Living   3 2 2  1 1    2      Review of Systems  Constitutional: Positive for chills.  Respiratory: Negative for shortness of breath.   Cardiovascular: Positive for chest pain and palpitations.  Gastrointestinal: Positive for nausea and vomiting ( x2). Negative for constipation.  Genitourinary: Positive for flank pain ( right). Negative for dysuria, hematuria, vaginal pain and pelvic pain.  Neurological: Positive for weakness and  headaches. Negative for dizziness and numbness.  All other systems reviewed and are negative.    Allergies  Review of patient's allergies indicates no known allergies.  Home Medications  No current outpatient prescriptions on file. BP 100/54  Pulse 94  Temp(Src) 99.5 F (37.5 C) (Oral)  Resp 19  Ht 5\' 4"  (1.626 m)  Wt 130 lb (58.968 kg)  BMI 22.3 kg/m2  SpO2 96%  LMP 08/20/2013  Breastfeeding? No Physical Exam  Nursing note and vitals reviewed. Constitutional: She is oriented to person, place, and time. She appears well-developed and well-nourished.  Pt lying on left side in exam bed, tearful.  HENT:  Head: Normocephalic and atraumatic.  Eyes: Conjunctivae and EOM are normal. Pupils are equal, round, and reactive to light. Right eye exhibits no discharge. Left eye exhibits no discharge. No scleral icterus.  Neck: Normal range of motion. Neck supple.  No nuchal rigidity or meningeal signs.   Cardiovascular: Regular rhythm and normal heart sounds.  Tachycardia present.   Pulmonary/Chest: Effort normal and breath sounds normal. No respiratory distress. She has no wheezes. She has no rales. She exhibits no tenderness.  No respiratory distress, able to speak in full sentences w/o difficulty.  Abdominal: Soft. Bowel sounds are normal. She exhibits no distension and no mass. There is tenderness ( right flank). There is no rebound and no guarding. Hernia confirmed negative in the right inguinal area and confirmed negative in the  left inguinal area.  Genitourinary: Uterus normal. No labial fusion. There is no rash, tenderness, lesion or injury on the right labia. There is no rash, tenderness, lesion or injury on the left labia. Cervix exhibits discharge. Cervix exhibits no motion tenderness and no friability. Right adnexum displays no mass, no tenderness and no fullness. Left adnexum displays no mass, no tenderness and no fullness. No erythema, tenderness or bleeding around the vagina. No  foreign body around the vagina. No signs of injury around the vagina. Vaginal discharge found.  White, malodorous discharge.  No CMT, adnexal tenderness or masses.   Musculoskeletal: Normal range of motion. She exhibits no edema and no tenderness.  Lymphadenopathy:       Right: No inguinal adenopathy present.       Left: No inguinal adenopathy present.  Neurological: She is alert and oriented to person, place, and time. She has normal strength. No cranial nerve deficit or sensory deficit. Coordination normal. GCS eye subscore is 4. GCS verbal subscore is 5. GCS motor subscore is 6.  Skin: Skin is warm and dry.  Psychiatric: Her mood appears anxious.  tearful    ED Course  Procedures (including critical care time) Labs Review Labs Reviewed  WET PREP, GENITAL - Abnormal; Notable for the following:    Trich, Wet Prep MODERATE (*)    Clue Cells Wet Prep HPF POC MODERATE (*)    WBC, Wet Prep HPF POC RARE (*)    All other components within normal limits  MRSA PCR SCREENING - Abnormal; Notable for the following:    MRSA by PCR POSITIVE (*)    All other components within normal limits  CBC WITH DIFFERENTIAL - Abnormal; Notable for the following:    WBC 13.4 (*)    Neutrophils Relative % 83 (*)    Neutro Abs 11.1 (*)    Lymphocytes Relative 6 (*)    Monocytes Absolute 1.5 (*)    All other components within normal limits  COMPREHENSIVE METABOLIC PANEL - Abnormal; Notable for the following:    Sodium 134 (*)    All other components within normal limits  URINALYSIS, ROUTINE W REFLEX MICROSCOPIC - Abnormal; Notable for the following:    APPearance TURBID (*)    Hgb urine dipstick TRACE (*)    Ketones, ur 40 (*)    Protein, ur 30 (*)    Nitrite POSITIVE (*)    Leukocytes, UA LARGE (*)    All other components within normal limits  URINE MICROSCOPIC-ADD ON - Abnormal; Notable for the following:    Squamous Epithelial / LPF FEW (*)    Bacteria, UA MANY (*)    All other components within  normal limits  GC/CHLAMYDIA PROBE AMP  URINE CULTURE  CULTURE, BLOOD (ROUTINE X 2)  CULTURE, BLOOD (ROUTINE X 2)  TSH  POCT PREGNANCY, URINE   Imaging Review Ct Abdomen Pelvis W Contrast  09/09/2013   CLINICAL DATA:  Right flank and lower abdominal pain  EXAM: CT ABDOMEN AND PELVIS WITH CONTRAST  TECHNIQUE: Multidetector CT imaging of the abdomen and pelvis was performed using the standard protocol following bolus administration of intravenous contrast.  CONTRAST:  OMNIPAQUE IOHEXOL 300 MG/ML  SOLN  COMPARISON:  04/18/2013  FINDINGS: BODY WALL: Unremarkable.  LOWER CHEST:  Mediastinum: Unremarkable.  Lungs/pleura: Subpleural reticulation at the left base is likely atelectasis.  ABDOMEN/PELVIS:  Liver: Periportal edema which is likely related to volume resuscitation.  Biliary: No evidence of biliary obstruction or stone.  Pancreas: Unremarkable.  Spleen: Unremarkable.  Adrenals: Unremarkable.  Kidneys and ureters: Patchy heterogeneous enhancement of the right kidney with asymmetric right perinephric edema. The right renal pelvis and ureter is distended with thick and avidly enhancing urothelium. No obstructing stone identified. No abscess.  Bladder: Distended by urine.  Bowel: No obstruction. No pericecal inflammation.  Retroperitoneum: No mass or adenopathy.  Peritoneum: Small volume free fluid in the low pelvis which may be reactive or physiologic.  Reproductive: Right-sided corpus luteum.  Vascular: No acute abnormality.  OSSEOUS: No acute abnormalities. No suspicious lytic or blastic lesions.  IMPRESSION: Right pyelonephritis without abscess. Mild right urinary collecting system dilation may be reactive to the infection as there is no obstructing stone or mass. Ultrasound could confirm ureteral patency.   Electronically Signed   By: Tiburcio Pea   On: 09/09/2013 03:02    MDM   1. Pyelonephritis, acute   2. Bacterial vaginitis   3. Dehydration, moderate    Pt with possible  pyelonephritis initially tachycardiac at 128 at triage, BP 108/62 and very anxious, c/o heart pounding and headache upon arrival.  Will tx pain and anxiety until labs and UA back.  Fluids started.  CBC: WBC-13.4 UA: positive nitrites, large leukocytes.  Ceftriaxone started.  Urine culture sent.   Tx: after 2L fluids, vitals were trending toward normal limits, during 3L BP dropped to 80s/40s again.  Will admit.  Filed Vitals:   09/09/13 1700  BP: 100/54  Pulse: 94  Temp:   Resp: 19   6:17 PM Consulted with Dr. Conley Rolls who agreed to admit pt. Requested CT abd/pelvis with contrast and pelvic exam as pt was dx with PID in 03/2013 but stated she did not get medication filled then due to lack of money and is still having a foul odor.   Pelvic showed small amount white, malodorous discharge. GC/Chlamydia and wet prep sent to lab. Pt sent to CT and will be moved to the floor with Dr. Conley Rolls taking over pt care.    Junius Finner, PA-C 09/09/13 1820

## 2013-09-08 NOTE — Progress Notes (Signed)
Patient confirms that she does not have a pcp or insurance.  [facility] provided patient a list of pcps who accept self pay patients, list of discounted pharmacies and website needymeds.org for medication assistance, information regarding Affordable Care Act and Medicaid for insurance, list of financial assistance in the community such as local churches and [employer].  Also provided patient information about the MAP program.  Discussed obtaining a pcp in order to qualify for the program.  Patient verbalized understanding.  Also provided information on the [facility], and instructed patient that the orange card was not insurance.  Patient verbalize understanding.  Patient thankful for resources.  Patient stated, "Can ou let them know that the last time they gave me a prescription and I didn't get it filled because I couldn't afford it."  [facility] discussed this with EDP.  [facility] provided patient with RX discount card.  No further needs at this time.

## 2013-09-09 ENCOUNTER — Encounter (HOSPITAL_COMMUNITY): Payer: Self-pay | Admitting: Internal Medicine

## 2013-09-09 ENCOUNTER — Emergency Department (HOSPITAL_COMMUNITY): Payer: Medicaid Other

## 2013-09-09 DIAGNOSIS — A499 Bacterial infection, unspecified: Secondary | ICD-10-CM

## 2013-09-09 DIAGNOSIS — N760 Acute vaginitis: Secondary | ICD-10-CM | POA: Diagnosis present

## 2013-09-09 DIAGNOSIS — E860 Dehydration: Secondary | ICD-10-CM

## 2013-09-09 DIAGNOSIS — N10 Acute tubulo-interstitial nephritis: Secondary | ICD-10-CM | POA: Diagnosis present

## 2013-09-09 DIAGNOSIS — B9689 Other specified bacterial agents as the cause of diseases classified elsewhere: Secondary | ICD-10-CM

## 2013-09-09 LAB — GC/CHLAMYDIA PROBE AMP
CT Probe RNA: NEGATIVE
GC Probe RNA: NEGATIVE

## 2013-09-09 LAB — WET PREP, GENITAL: Yeast Wet Prep HPF POC: NONE SEEN

## 2013-09-09 LAB — MRSA PCR SCREENING: MRSA by PCR: POSITIVE — AB

## 2013-09-09 MED ORDER — ACETAMINOPHEN 325 MG PO TABS
650.0000 mg | ORAL_TABLET | Freq: Four times a day (QID) | ORAL | Status: DC | PRN
  Administered 2013-09-09 – 2013-09-11 (×3): 650 mg via ORAL
  Filled 2013-09-09 (×3): qty 2

## 2013-09-09 MED ORDER — IOHEXOL 300 MG/ML  SOLN
50.0000 mL | Freq: Once | INTRAMUSCULAR | Status: AC | PRN
  Administered 2013-09-09: 50 mL via ORAL

## 2013-09-09 MED ORDER — IOHEXOL 300 MG/ML  SOLN
100.0000 mL | Freq: Once | INTRAMUSCULAR | Status: AC | PRN
  Administered 2013-09-09: 100 mL via INTRAVENOUS

## 2013-09-09 MED ORDER — HYDROMORPHONE HCL PF 1 MG/ML IJ SOLN
1.0000 mg | INTRAMUSCULAR | Status: DC | PRN
  Administered 2013-09-09 – 2013-09-10 (×7): 1 mg via INTRAVENOUS
  Filled 2013-09-09 (×7): qty 1

## 2013-09-09 MED ORDER — CHLORHEXIDINE GLUCONATE CLOTH 2 % EX PADS: 6.0000 | MEDICATED_PAD | Freq: Every day | CUTANEOUS | Status: DC

## 2013-09-09 MED ORDER — SODIUM CHLORIDE 0.9 % IV BOLUS (SEPSIS)
1000.0000 mL | Freq: Once | INTRAVENOUS | Status: AC
  Administered 2013-09-09: 1000 mL via INTRAVENOUS

## 2013-09-09 MED ORDER — MUPIROCIN 2 % EX OINT
1.0000 "application " | TOPICAL_OINTMENT | Freq: Two times a day (BID) | CUTANEOUS | Status: DC
  Administered 2013-09-09 – 2013-09-11 (×5): 1 via NASAL
  Filled 2013-09-09 (×2): qty 22

## 2013-09-09 MED ORDER — METRONIDAZOLE 500 MG PO TABS
500.0000 mg | ORAL_TABLET | Freq: Three times a day (TID) | ORAL | Status: DC
  Administered 2013-09-09 – 2013-09-11 (×7): 500 mg via ORAL
  Filled 2013-09-09 (×10): qty 1

## 2013-09-09 MED ORDER — MUPIROCIN 2 % EX OINT: 1.0000 "application " | TOPICAL_OINTMENT | Freq: Two times a day (BID) | CUTANEOUS | Status: DC

## 2013-09-09 MED ORDER — CHLORHEXIDINE GLUCONATE CLOTH 2 % EX PADS
6.0000 | MEDICATED_PAD | Freq: Every day | CUTANEOUS | Status: DC
  Administered 2013-09-09 – 2013-09-11 (×3): 6 via TOPICAL

## 2013-09-09 MED ORDER — DEXTROSE 5 % IV SOLN: 1.0000 g | INTRAVENOUS | Status: DC

## 2013-09-09 MED ORDER — ONDANSETRON HCL 4 MG PO TABS: 4.0000 mg | ORAL_TABLET | Freq: Four times a day (QID) | ORAL | Status: DC | PRN

## 2013-09-09 MED ORDER — KCL IN DEXTROSE-NACL 40-5-0.9 MEQ/L-%-% IV SOLN
INTRAVENOUS | Status: DC
  Administered 2013-09-09 – 2013-09-11 (×5): via INTRAVENOUS
  Filled 2013-09-09 (×11): qty 1000

## 2013-09-09 MED ORDER — HEPARIN SODIUM (PORCINE) 5000 UNIT/ML IJ SOLN
5000.0000 [IU] | Freq: Three times a day (TID) | INTRAMUSCULAR | Status: DC
  Administered 2013-09-09 – 2013-09-11 (×8): 5000 [IU] via SUBCUTANEOUS
  Filled 2013-09-09 (×10): qty 1

## 2013-09-09 MED ORDER — ONDANSETRON HCL 4 MG/2ML IJ SOLN
4.0000 mg | Freq: Four times a day (QID) | INTRAMUSCULAR | Status: DC | PRN
  Administered 2013-09-09 – 2013-09-11 (×3): 4 mg via INTRAVENOUS
  Filled 2013-09-09 (×4): qty 2

## 2013-09-09 MED ORDER — PIPERACILLIN-TAZOBACTAM 3.375 G IVPB
3.3750 g | Freq: Three times a day (TID) | INTRAVENOUS | Status: DC
  Administered 2013-09-09 – 2013-09-11 (×6): 3.375 g via INTRAVENOUS
  Filled 2013-09-09 (×8): qty 50

## 2013-09-09 MED ORDER — SODIUM CHLORIDE 0.9 % IJ SOLN
3.0000 mL | Freq: Two times a day (BID) | INTRAMUSCULAR | Status: DC
  Administered 2013-09-09 – 2013-09-11 (×4): 3 mL via INTRAVENOUS

## 2013-09-09 NOTE — Progress Notes (Signed)
Subjective: Patient admitted this am with pyelonephritis, was started on Rocephin, still feels weak Filed Vitals:   09/09/13 1200  BP: 90/46  Pulse: 85  Temp: 100.9 F (38.3 C)  Resp: 20    Chest: Clear Bilaterally Heart : S1S2 RRR Abdomen: Soft,positive right cva tenderness Ext : No edema Neuro: Alert, oriented x 3  A/P  Pyelonephritis Will change the antibiotics to zosyn Continue IV normal saline.   Meredeth Ide [facility] [phone]

## 2013-09-09 NOTE — Progress Notes (Signed)
CARE MANAGEMENT NOTE 09/09/2013  Patient:  Stacy Peterson, Stacy Peterson   Account Number:  [id number]  Date Initiated:  09/09/2013  Documentation initiated by:  DAVIS,RHONDA  Subjective/Objective Assessment:   pt with recent hx of untreatment std, now with pyleonephritis and sepsis     Action/Plan:   home when stable   Anticipated DC Date:  09/12/2013   Anticipated DC Plan:  HOME/SELF CARE  In-house referral  Financial Counselor      DC Planning Services  NA      [facility] Choice  NA   Choice offered to / List presented to:  NA   DME arranged  NA      DME agency  NA     HH arranged  NA      HH agency  NA   Status of service:  In process, will continue to follow Medicare Important Message given?  NA - LOS <3 / Initial given by admissions (If response is "NO", the following Medicare IM given date fields will be blank) Date Medicare IM given:   Date Additional Medicare IM given:    Discharge Disposition:    Per UR Regulation:  Reviewed for med. necessity/level of care/duration of stay  If discussed at Long Length of Stay Meetings, dates discussed:    Comments:  [id number] Stark Jock, BSN, [place] [phone] Chart Reviewed for discharge and hospital needs. Discharge needs at time of review:  None Review of patient progress due on [id number].

## 2013-09-09 NOTE — Progress Notes (Signed)
ANTIBIOTIC CONSULT NOTE - INITIAL  Pharmacy Consult for Zosyn Indication: pyelonephritis   No Known Allergies  Patient Measurements: Height: 5\' 4"  (162.6 cm) Weight: 130 lb (58.968 kg) IBW/kg (Calculated) : 54.7  Vital Signs: Temp: 99 Peterson (37.2 C) (09/16 0311) Temp src: Oral (09/15 2354) BP: 90/61 mmHg (09/16 0700) Pulse Rate: 99 (09/16 0700) Intake/Output from previous day: 09/15 0701 - 09/16 0700 In: 5072.9 [I.V.:3922.9; IV Piggyback:1150] Out: 800 [Urine:800]  Labs:  Recent Labs  09/08/13 2000  WBC 13.4*  HGB 12.7  PLT 185  CREATININE 0.62   Estimated Creatinine Clearance: 96.1 ml/min (by C-G formula based on Cr of 0.62).  Microbiology: Recent Results (from the past 720 hour(s))  WET PREP, GENITAL     Status: Abnormal   Collection Time    09/09/13  1:20 AM      Result Value Range Status   Yeast Wet Prep HPF POC NONE SEEN  NONE SEEN Final   Trich, Wet Prep MODERATE (*) NONE SEEN Final   Clue Cells Wet Prep HPF POC MODERATE (*) NONE SEEN Final   WBC, Wet Prep HPF POC RARE (*) NONE SEEN Final  MRSA PCR SCREENING     Status: Abnormal   Collection Time    09/09/13  2:41 AM      Result Value Range Status   MRSA by PCR POSITIVE (*) NEGATIVE Final   Comment:            The GeneXpert MRSA Assay (FDA     approved for NASAL specimens     only), is one component of a     comprehensive MRSA colonization     surveillance program. It is not     intended to diagnose MRSA     infection nor to guide or     monitor treatment for     MRSA infections.     RESULT CALLED TO, READ BACK BY AND VERIFIED WITH:     HJOHNSON RN AT 0505 ON [id number] BY DLONG    Medical History: Past Medical History  Diagnosis Date  . Shingles     Medications:  Anti-infectives   Start     Dose/Rate Route Frequency Ordered Stop   09/09/13 2200  cefTRIAXone (ROCEPHIN) 1 g in dextrose 5 % 50 mL IVPB  Status:  Discontinued     1 g 100 mL/hr over 30 Minutes Intravenous Every 24 hours 09/09/13  0239 09/09/13 0755   09/09/13 0600  metroNIDAZOLE (FLAGYL) tablet 500 mg     500 mg Oral 3 times per day 09/09/13 0239     09/08/13 2130  cefTRIAXone (ROCEPHIN) 1 g in dextrose 5 % 50 mL IVPB     1 g 100 mL/hr over 30 Minutes Intravenous  Once 09/08/13 2125 09/08/13 2236     Stacy Peterson: 22 yo Peterson admitted 9/16 with possible pyelonephritis.  PMH includes untreated trichomonas and could also then have BV and PID.  Abdominal/pelvic CT pending.  First doses of ceftriaxone and metronidazole have been given.  Pharmacy is asked to dose Zosyn.  WBC 13.4  SCr 0.62, CrCl ~ 96 ml/min  Blood and urine cultures pending.   Goal of Therapy:  Appropriate abx dosing, eradication of infection.   Plan:   Zosyn 3.375g IV Q8H infused over 4hrs.  Remains on metronidazole, dosing per MD.    Follow up renal fxn and culture results.  Lynann Beaver PharmD, BCPS Pager [phone] 09/09/2013 8:11 AM

## 2013-09-09 NOTE — ED Notes (Signed)
Pt to have CT scan first before transfer to floor unit. 

## 2013-09-09 NOTE — H&P (Signed)
[facility] History and Physical  Stacy Peterson:[id number] DOB: 1991-12-15    PCP:   None.  Chief Complaint: Right flank pain for one day.  HPI: Stacy Peterson is an 22 y.o. female with hx of Trichomonas Vaginalis, untreated due to lack of means, hx of prior knee surgery, presents to the ER with one day hx of right flank pain, mild dysuria, and malodorous vaginal discharge.  She has low grade fever and myalgia. Her LMP was about three weeks ago.  She is sexually active and has been using condom.  Evaluation in the ER showed hypotension with SBP 80-88, tachycardic with HR 115.  Her UA showed TNTC WBCs and cloudy urine.  She has a mild leukocytosis with a WBC of 13K, and normal renal fx tests.  She was given IVF (3 L thus far), and hospitalalist was asked to admit her due to hypotension.  She has some nausea, and hadn't been able to take much oral fluids.    Rewiew of Systems:  Constitutional: Negative for malaise, fever and chills. No significant weight loss or weight gain Eyes: Negative for eye pain, redness and discharge, diplopia, visual changes, or flashes of light. ENMT: Negative for ear pain, hoarseness, nasal congestion, sinus pressure and sore throat. No headaches; tinnitus, drooling, or problem swallowing. Cardiovascular: Negative for chest pain, palpitations, diaphoresis, dyspnea and peripheral edema. ; No orthopnea, PND Respiratory: Negative for cough, hemoptysis, wheezing and stridor. No pleuritic chestpain. Gastrointestinal: Negative for nausea, vomiting, diarrhea, constipation, abdominal pain, melena, blood in stool, hematemesis, jaundice and rectal bleeding.    Genitourinary: Negative for frequency,  incontinence, and hematuria; Musculoskeletal: Negative for neck pain. Negative for swelling and trauma.;  Skin: . Negative for pruritus, rash, abrasions, bruising and skin lesion.; ulcerations Neuro: Negative for headache, lightheadedness and neck stiffness. Negative for  altered level of consciousness , altered mental status, extremity weakness, burning feet, involuntary movement, seizure and syncope.  Psych: negative for anxiety, depression, insomnia, tearfulness, panic attacks, hallucinations, paranoia, suicidal or homicidal ideation    Past Medical History  Diagnosis Date  . Shingles     Past Surgical History  Procedure Laterality Date  . Knee surgery    . Knee surgery      Medications:  HOME MEDS: Prior to Admission medications   Not on File     Allergies:  No Known Allergies  Social History:   reports that she has been smoking Cigarettes.  She has been smoking about 0.50 packs per day. She has never used smokeless tobacco. She reports that she uses illicit drugs (Marijuana). She reports that she does not drink alcohol.  Family History: No family history on file.   Physical Exam: Filed Vitals:   09/08/13 2300 09/08/13 2306 09/08/13 2342 09/08/13 2354  BP: 103/58 103/58 80/42 88/51   Pulse: 93 83 85 92  Temp:   99.3 F (37.4 C) 99.2 F (37.3 C)  TempSrc:    Oral  Resp: 24 18 18 18   Height:      Weight:      SpO2: 100% 100% 96% 98%   Blood pressure 88/51, pulse 92, temperature 99.2 F (37.3 C), temperature source Oral, resp. rate 18, height 5\' 4"  (1.626 m), weight 58.968 kg (130 lb), last menstrual period 08/20/2013, SpO2 98.00%, not currently breastfeeding.  GEN:  Pleasant  patient lying in the stretcher in no acute distress; cooperative with exam. PSYCH:  alert and oriented x4; does not appear anxious or depressed; affect is appropriate. HEENT: Mucous  membranes pink and anicteric; PERRLA; EOM intact; no cervical lymphadenopathy nor thyromegaly or carotid bruit; no JVD; There were no stridor. Neck is very supple. Breasts:: Not examined CHEST WALL: No tenderness CHEST: Normal respiration, clear to auscultation bilaterally.  HEART: Tachycardia, and regular  There are no murmur, rub, or gallops.   BACK: No kyphosis or  scoliosis; no CVA tenderness ABDOMEN: soft and non-tender; no masses, no organomegaly, normal abdominal bowel sounds; no pannus; no intertriginous candida. There is no rebound and no distention. She has slight flank pain. Rectal Exam: Not done EXTREMITIES: No bone or joint deformity; age-appropriate arthropathy of the hands and knees; no edema; no ulcerations.  There is no calf tenderness. Genitalia: not examined PULSES: 2+ and symmetric SKIN: Normal hydration no rash or ulceration CNS: Cranial nerves 2-12 grossly intact no focal lateralizing neurologic deficit.  Speech is fluent; uvula elevated with phonation, facial symmetry and tongue midline. DTR are normal bilaterally, cerebella exam is intact, barbinski is negative and strengths are equaled bilaterally.  No sensory loss.   Labs on Admission:  Basic Metabolic Panel:  Recent Labs Lab 09/08/13 2000  NA 134*  K 4.0  CL 100  CO2 21  GLUCOSE 89  BUN 9  CREATININE 0.62  CALCIUM 9.4   Liver Function Tests:  Recent Labs Lab 09/08/13 2000  AST 18  ALT 9  ALKPHOS 63  BILITOT 0.6  PROT 7.6  ALBUMIN 4.2   No results found for this basename: LIPASE, AMYLASE,  in the last 168 hours No results found for this basename: AMMONIA,  in the last 168 hours CBC:  Recent Labs Lab 09/08/13 2000  WBC 13.4*  NEUTROABS 11.1*  HGB 12.7  HCT 37.6  MCV 89.3  PLT 185   Cardiac Enzymes: No results found for this basename: CKTOTAL, CKMB, CKMBINDEX, TROPONINI,  in the last 168 hours  CBG: No results found for this basename: GLUCAP,  in the last 168 hours   Radiological Exams on Admission: No results found.  Assessment/Plan Present on Admission:  . Pyelonephritis, acute . Dehydration, moderate . Bacterial vaginitis  PLAN:  I suspect she has pyelonephritis with dehydration.  Because of her prior untreated trichomonas, she could have BV and possible PID as well.  She will need a speculum exam with cultures.  The MLP in the ER was  kind enough to perform this.  She will also need to have her abdominal pelvic CT without contrast as well.  She had urine culture, and I will obtain BC as well.  Will Tx her with IVF, IV Rocephin, and PO Flagyl.  She will be given some pain medication.  I will obtain HIV, RPR, just to be complete.  Because she is hypotensive, though asymptomatic, will admit her to SDU for closer observation and in case she would need pressor.  She is a full code under [facility] service.  Thank you for allowing me to participate in her care.  Other plans as per orders.  Code Status: FULL Unk Lightning, MD. [facility] Pager [phone] 7pm to 7am.  09/09/2013, 1:14 AM

## 2013-09-10 LAB — URINE CULTURE

## 2013-09-10 MED ORDER — OXYCODONE HCL 5 MG PO TABS
5.0000 mg | ORAL_TABLET | ORAL | Status: DC | PRN
  Administered 2013-09-11: 5 mg via ORAL
  Filled 2013-09-10: qty 1

## 2013-09-10 MED ORDER — PROMETHAZINE HCL 25 MG/ML IJ SOLN
12.5000 mg | INTRAMUSCULAR | Status: DC | PRN
  Administered 2013-09-10 – 2013-09-11 (×2): 12.5 mg via INTRAVENOUS
  Filled 2013-09-10 (×2): qty 1

## 2013-09-10 MED ORDER — HYDROMORPHONE HCL PF 1 MG/ML IJ SOLN
1.0000 mg | INTRAMUSCULAR | Status: DC | PRN
  Administered 2013-09-10 – 2013-09-11 (×6): 1 mg via INTRAVENOUS
  Filled 2013-09-10 (×7): qty 1

## 2013-09-10 NOTE — Progress Notes (Signed)
[facility] PROGRESS NOTE  SAMANI DEAL WJX:[id number] DOB: 1991-11-09 DOA: 09/08/2013 PCP: No primary provider on file.  Assessment/Plan: 1. Right Pyelonephritis: urine cultures growing E coli. On zosyn. An dIV fluids.  2. Dehydration: IV fluids.  3. Bacterial vaginitis: on flagyl.  Code Status: fullcode  Family Communication:none atbedside Disposition Plan: pending.    Consultants:  none Procedures:  none  Antibiotics:  IV zosyn9/16  Flagyl  9/15  HPI/Subjective: Uncontrolled pain. Increased her pain meds.   Objective: Filed Vitals:   09/10/13 0524  BP: 102/63  Pulse: 78  Temp: 98.9 F (37.2 C)  Resp: 20    Intake/Output Summary (Last 24 hours) at 09/10/13 1253 Last data filed at 09/10/13 0600  Gross per 24 hour  Intake   2250 ml  Output   1600 ml  Net    650 ml   Filed Weights   09/08/13 1934  Weight: 58.968 kg (130 lb)    Exam:   General:  ALERT afebrile   Cardiovascular: s1s2  Respiratory: ctab  Abdomen: soft NT ND BS+  Musculoskeletal: RIGHT CVA tenderness.   Data Reviewed: Basic Metabolic Panel:  Recent Labs Lab 09/08/13 2000  NA 134*  K 4.0  CL 100  CO2 21  GLUCOSE 89  BUN 9  CREATININE 0.62  CALCIUM 9.4   Liver Function Tests:  Recent Labs Lab 09/08/13 2000  AST 18  ALT 9  ALKPHOS 63  BILITOT 0.6  PROT 7.6  ALBUMIN 4.2   No results found for this basename: LIPASE, AMYLASE,  in the last 168 hours No results found for this basename: AMMONIA,  in the last 168 hours CBC:  Recent Labs Lab 09/08/13 2000  WBC 13.4*  NEUTROABS 11.1*  HGB 12.7  HCT 37.6  MCV 89.3  PLT 185   Cardiac Enzymes: No results found for this basename: CKTOTAL, CKMB, CKMBINDEX, TROPONINI,  in the last 168 hours BNP (last 3 results) No results found for this basename: PROBNP,  in the last 8760 hours CBG: No results found for this basename: GLUCAP,  in the last 168 hours  Recent Results (from the past 240 hour(s))  URINE  CULTURE     Status: None   Collection Time    09/08/13  8:37 PM      Result Value Range Status   Specimen Description URINE, CLEAN CATCH   Final   Special Requests NONE   Final   Culture  Setup Time     Final   Value: 09/09/2013 01:24     Performed at [facility] Count     Final   Value: >=100,000 COLONIES/ML     Performed at [facility]   Culture     Final   Value: ESCHERICHIA COLI     Performed at [facility]   Report Status PENDING   Incomplete  CULTURE, BLOOD (ROUTINE X 2)     Status: None   Collection Time    09/09/13  1:13 AM      Result Value Range Status   Specimen Description BLOOD RIGHT HAND   Final   Special Requests BOTTLES DRAWN AEROBIC AND ANAEROBIC 1CC   Final   Culture  Setup Time     Final   Value: 09/09/2013 06:17     Performed at [facility]   Culture     Final   Value:        BLOOD CULTURE RECEIVED NO GROWTH TO DATE CULTURE  WILL BE HELD FOR 5 DAYS BEFORE ISSUING A FINAL NEGATIVE REPORT     Performed at [facility]   Report Status PENDING   Incomplete  CULTURE, BLOOD (ROUTINE X 2)     Status: None   Collection Time    09/09/13  1:18 AM      Result Value Range Status   Specimen Description BLOOD RIGHT ANTECUBITAL   Final   Special Requests BOTTLES DRAWN AEROBIC AND ANAEROBIC 5CC   Final   Culture  Setup Time     Final   Value: 09/09/2013 06:16     Performed at [facility]   Culture     Final   Value:        BLOOD CULTURE RECEIVED NO GROWTH TO DATE CULTURE WILL BE HELD FOR 5 DAYS BEFORE ISSUING A FINAL NEGATIVE REPORT     Performed at [facility]   Report Status PENDING   Incomplete  GC/CHLAMYDIA PROBE AMP     Status: None   Collection Time    09/09/13  1:20 AM      Result Value Range Status   CT Probe RNA NEGATIVE  NEGATIVE Final   GC Probe RNA NEGATIVE  NEGATIVE Final   Comment: (NOTE)                                                                                               Normal Reference Range: Negative          Assay performed using the Gen-Probe APTIMA COMBO2 (R) Assay.     Acceptable specimen types for this assay include APTIMA Swabs (Unisex,     endocervical, urethral, or vaginal), first void urine, and ThinPrep     liquid based cytology samples.     Performed at [facility]  WET PREP, GENITAL     Status: Abnormal   Collection Time    09/09/13  1:20 AM      Result Value Range Status   Yeast Wet Prep HPF POC NONE SEEN  NONE SEEN Final   Trich, Wet Prep MODERATE (*) NONE SEEN Final   Clue Cells Wet Prep HPF POC MODERATE (*) NONE SEEN Final   WBC, Wet Prep HPF POC RARE (*) NONE SEEN Final  MRSA PCR SCREENING     Status: Abnormal   Collection Time    09/09/13  2:41 AM      Result Value Range Status   MRSA by PCR POSITIVE (*) NEGATIVE Final   Comment:            The GeneXpert MRSA Assay (FDA     approved for NASAL specimens     only), is one component of a     comprehensive MRSA colonization     surveillance program. It is not     intended to diagnose MRSA     infection nor to guide or     monitor treatment for     MRSA infections.     RESULT CALLED TO, READ BACK BY AND VERIFIED WITH:     HJOHNSON RN AT 0505 ON [id number] BY  DLONG     Studies: Ct Abdomen Pelvis W Contrast  09/09/2013   CLINICAL DATA:  Right flank and lower abdominal pain  EXAM: CT ABDOMEN AND PELVIS WITH CONTRAST  TECHNIQUE: Multidetector CT imaging of the abdomen and pelvis was performed using the standard protocol following bolus administration of intravenous contrast.  CONTRAST:  OMNIPAQUE IOHEXOL 300 MG/ML  SOLN  COMPARISON:  04/18/2013  FINDINGS: BODY WALL: Unremarkable.  LOWER CHEST:  Mediastinum: Unremarkable.  Lungs/pleura: Subpleural reticulation at the left base is likely atelectasis.  ABDOMEN/PELVIS:  Liver: Periportal edema which is likely related to volume resuscitation.  Biliary: No evidence of biliary obstruction or stone.  Pancreas: Unremarkable.  Spleen:  Unremarkable.  Adrenals: Unremarkable.  Kidneys and ureters: Patchy heterogeneous enhancement of the right kidney with asymmetric right perinephric edema. The right renal pelvis and ureter is distended with thick and avidly enhancing urothelium. No obstructing stone identified. No abscess.  Bladder: Distended by urine.  Bowel: No obstruction. No pericecal inflammation.  Retroperitoneum: No mass or adenopathy.  Peritoneum: Small volume free fluid in the low pelvis which may be reactive or physiologic.  Reproductive: Right-sided corpus luteum.  Vascular: No acute abnormality.  OSSEOUS: No acute abnormalities. No suspicious lytic or blastic lesions.  IMPRESSION: Right pyelonephritis without abscess. Mild right urinary collecting system dilation may be reactive to the infection as there is no obstructing stone or mass. Ultrasound could confirm ureteral patency.   Electronically Signed   By: Tiburcio Pea   On: 09/09/2013 03:02    Scheduled Meds: . Chlorhexidine Gluconate Cloth  6 each Topical Q0600  . heparin  5,000 Units Subcutaneous Q8H  . metroNIDAZOLE  500 mg Oral Q8H  . mupirocin ointment  1 application Nasal BID  . piperacillin-tazobactam (ZOSYN)  IV  3.375 g Intravenous Q8H  . sodium chloride  3 mL Intravenous Q12H   Continuous Infusions: . dextrose 5 % and 0.9 % NaCl with KCl 40 mEq/L 125 mL/hr at 09/10/13 0522    Active Problems:   Pyelonephritis, acute   Dehydration, moderate   Bacterial vaginitis    Time spent: 25 MIN    Avyay Coger  [facility] Pager [phone]. If 7PM-7AM, please contact night-coverage at [email or web address], password [facility] 09/10/2013, 12:53 PM  LOS: 2 days

## 2013-09-11 LAB — IRON AND TIBC
Saturation Ratios: 7 % — ABNORMAL LOW (ref 20–55)
TIBC: 237 ug/dL — ABNORMAL LOW (ref 250–470)

## 2013-09-11 LAB — BASIC METABOLIC PANEL
BUN: <3 mg/dL — ABNORMAL LOW (ref 6–23)
CO2: 21 mEq/L (ref 19–32)
Chloride: 107 mEq/L (ref 96–112)
Creatinine, Ser: 0.68 mg/dL (ref 0.50–1.10)
Glucose, Bld: 126 mg/dL — ABNORMAL HIGH (ref 70–99)

## 2013-09-11 LAB — CBC
HCT: 27.4 % — ABNORMAL LOW (ref 36.0–46.0)
Hemoglobin: 9.3 g/dL — ABNORMAL LOW (ref 12.0–15.0)
MCV: 89.5 fL (ref 78.0–100.0)
RBC: 3.06 MIL/uL — ABNORMAL LOW (ref 3.87–5.11)
WBC: 4.3 10*3/uL (ref 4.0–10.5)

## 2013-09-11 LAB — FERRITIN: Ferritin: 48 ng/mL (ref 10–291)

## 2013-09-11 LAB — RETICULOCYTES: Retic Ct Pct: 1.1 % (ref 0.4–3.1)

## 2013-09-11 MED ORDER — MUPIROCIN 2 % EX OINT: 1.0000 "application " | TOPICAL_OINTMENT | Freq: Two times a day (BID) | CUTANEOUS | Status: DC

## 2013-09-11 MED ORDER — METRONIDAZOLE 500 MG PO TABS
500.0000 mg | ORAL_TABLET | Freq: Two times a day (BID) | ORAL | Status: DC
  Filled 2013-09-11: qty 1

## 2013-09-11 MED ORDER — CIPROFLOXACIN HCL 500 MG PO TABS: 500.0000 mg | ORAL_TABLET | Freq: Two times a day (BID) | ORAL | Status: DC

## 2013-09-11 MED ORDER — METRONIDAZOLE 500 MG PO TABS: 500.0000 mg | ORAL_TABLET | Freq: Two times a day (BID) | ORAL | Status: DC

## 2013-09-11 NOTE — Progress Notes (Signed)
Discharge instructions given to pt, verbalized understanding. Left the unit in stable condition. 

## 2013-09-11 NOTE — Discharge Summary (Signed)
Physician Discharge Summary  Stacy Peterson YNW:[id number] DOB: 1991-01-09 DOA: 09/08/2013  PCP: No primary provider on file.  Admit date: 09/08/2013 Discharge date: 09/11/2013  Time spent: 35 minutes  Recommendations for Outpatient Follow-up:  1. Follow up with PCP in one week.  2. Please check CBC in one week.   Discharge Diagnoses:  Active Problems:   Pyelonephritis, acute   Dehydration, moderate   Bacterial vaginitis   Discharge Condition: improved.  Diet recommendation: regular   Filed Weights   09/08/13 1934  Weight: 58.968 kg (130 lb)    History of present illness:  Stacy Peterson is an 22 y.o. female with hx of Trichomonas Vaginalis, untreated due to lack of means, hx of prior knee surgery, presents to the ER with one day hx of right flank pain, mild dysuria, and malodorous vaginal discharge. She has low grade fever and myalgia. Her LMP was about three weeks ago. She is sexually active and has been using condom. Evaluation in the ER showed hypotension with SBP 80-88, tachycardic with HR 115. Her UA showed TNTC WBCs and cloudy urine. She has a mild leukocytosis with a WBC of 13K, and normal renal fx tests. She was given IVF (3 L thus far), and hospitalalist was asked to admit her due to hypotension   Hospital Course:  Right Pyelonephritis: Urine cultures grew e coli and sensitive to ciprofloxacin. She completed 2 days of IV zosyn.  Dehydration: hydration with IV fluids and hypotension resolved.   Bacterial vaginosis: complete the course of flagyl.   Procedures:  CT Abd and pelvis   Consultations:  none  Discharge Exam: Filed Vitals:   09/11/13 1342  BP: 97/61  Pulse: 63  Temp: 98.9 F (37.2 C)  Resp: 16    General: alert afebrile comfortable Cardiovascular: s1s2 Respiratory: CTAB  Discharge Instructions  Discharge Orders   Future Orders Complete By Expires   Discharge instructions  As directed    Comments:     Follow up with PCP in one week.        Medication List         ciprofloxacin 500 MG tablet  Commonly known as:  CIPRO  Take 1 tablet (500 mg total) by mouth 2 (two) times daily.     metroNIDAZOLE 500 MG tablet  Commonly known as:  FLAGYL  Take 1 tablet (500 mg total) by mouth every 12 (twelve) hours.     mupirocin ointment 2 %  Commonly known as:  BACTROBAN  Apply 1 application topically 2 (two) times daily.       No Known Allergies    The results of significant diagnostics from this hospitalization (including imaging, microbiology, ancillary and laboratory) are listed below for reference.    Significant Diagnostic Studies: Ct Abdomen Pelvis W Contrast  09/09/2013   CLINICAL DATA:  Right flank and lower abdominal pain  EXAM: CT ABDOMEN AND PELVIS WITH CONTRAST  TECHNIQUE: Multidetector CT imaging of the abdomen and pelvis was performed using the standard protocol following bolus administration of intravenous contrast.  CONTRAST:  OMNIPAQUE IOHEXOL 300 MG/ML  SOLN  COMPARISON:  04/18/2013  FINDINGS: BODY WALL: Unremarkable.  LOWER CHEST:  Mediastinum: Unremarkable.  Lungs/pleura: Subpleural reticulation at the left base is likely atelectasis.  ABDOMEN/PELVIS:  Liver: Periportal edema which is likely related to volume resuscitation.  Biliary: No evidence of biliary obstruction or stone.  Pancreas: Unremarkable.  Spleen: Unremarkable.  Adrenals: Unremarkable.  Kidneys and ureters: Patchy heterogeneous enhancement of the right kidney with  asymmetric right perinephric edema. The right renal pelvis and ureter is distended with thick and avidly enhancing urothelium. No obstructing stone identified. No abscess.  Bladder: Distended by urine.  Bowel: No obstruction. No pericecal inflammation.  Retroperitoneum: No mass or adenopathy.  Peritoneum: Small volume free fluid in the low pelvis which may be reactive or physiologic.  Reproductive: Right-sided corpus luteum.  Vascular: No acute abnormality.  OSSEOUS: No acute  abnormalities. No suspicious lytic or blastic lesions.  IMPRESSION: Right pyelonephritis without abscess. Mild right urinary collecting system dilation may be reactive to the infection as there is no obstructing stone or mass. Ultrasound could confirm ureteral patency.   Electronically Signed   By: Tiburcio Pea   On: 09/09/2013 03:02    Microbiology: Recent Results (from the past 240 hour(s))  URINE CULTURE     Status: None   Collection Time    09/08/13  8:37 PM      Result Value Range Status   Specimen Description URINE, CLEAN CATCH   Final   Special Requests NONE   Final   Culture  Setup Time     Final   Value: 09/09/2013 01:24     Performed at [facility] Count     Final   Value: >=100,000 COLONIES/ML     Performed at [facility]   Culture     Final   Value: ESCHERICHIA COLI     Performed at [facility]   Report Status 09/10/2013 FINAL   Final   Organism ID, Bacteria ESCHERICHIA COLI   Final  CULTURE, BLOOD (ROUTINE X 2)     Status: None   Collection Time    09/09/13  1:13 AM      Result Value Range Status   Specimen Description BLOOD RIGHT HAND   Final   Special Requests BOTTLES DRAWN AEROBIC AND ANAEROBIC 1CC   Final   Culture  Setup Time     Final   Value: 09/09/2013 06:17     Performed at [facility]   Culture     Final   Value:        BLOOD CULTURE RECEIVED NO GROWTH TO DATE CULTURE WILL BE HELD FOR 5 DAYS BEFORE ISSUING A FINAL NEGATIVE REPORT     Performed at [facility]   Report Status PENDING   Incomplete  CULTURE, BLOOD (ROUTINE X 2)     Status: None   Collection Time    09/09/13  1:18 AM      Result Value Range Status   Specimen Description BLOOD RIGHT ANTECUBITAL   Final   Special Requests BOTTLES DRAWN AEROBIC AND ANAEROBIC 5CC   Final   Culture  Setup Time     Final   Value: 09/09/2013 06:16     Performed at [facility]   Culture     Final   Value:        BLOOD CULTURE RECEIVED NO GROWTH TO  DATE CULTURE WILL BE HELD FOR 5 DAYS BEFORE ISSUING A FINAL NEGATIVE REPORT     Performed at [facility]   Report Status PENDING   Incomplete  GC/CHLAMYDIA PROBE AMP     Status: None   Collection Time    09/09/13  1:20 AM      Result Value Range Status   CT Probe RNA NEGATIVE  NEGATIVE Final   GC Probe RNA NEGATIVE  NEGATIVE Final   Comment: (NOTE)  Normal Reference Range: Negative          Assay performed using the Gen-Probe APTIMA COMBO2 (R) Assay.     Acceptable specimen types for this assay include APTIMA Swabs (Unisex,     endocervical, urethral, or vaginal), first void urine, and ThinPrep     liquid based cytology samples.     Performed at [facility]  WET PREP, GENITAL     Status: Abnormal   Collection Time    09/09/13  1:20 AM      Result Value Range Status   Yeast Wet Prep HPF POC NONE SEEN  NONE SEEN Final   Trich, Wet Prep MODERATE (*) NONE SEEN Final   Clue Cells Wet Prep HPF POC MODERATE (*) NONE SEEN Final   WBC, Wet Prep HPF POC RARE (*) NONE SEEN Final  MRSA PCR SCREENING     Status: Abnormal   Collection Time    09/09/13  2:41 AM      Result Value Range Status   MRSA by PCR POSITIVE (*) NEGATIVE Final   Comment:            The GeneXpert MRSA Assay (FDA     approved for NASAL specimens     only), is one component of a     comprehensive MRSA colonization     surveillance program. It is not     intended to diagnose MRSA     infection nor to guide or     monitor treatment for     MRSA infections.     RESULT CALLED TO, READ BACK BY AND VERIFIED WITH:     HJOHNSON RN AT 0505 ON [id number] BY DLONG     Labs: Basic Metabolic Panel:  Recent Labs Lab 09/08/13 2000 09/11/13 0450  NA 134* 136  K 4.0 3.9  CL 100 107  CO2 21 21  GLUCOSE 89 126*  BUN 9 <3*  CREATININE 0.62 0.68  CALCIUM 9.4 8.2*   Liver Function Tests:  Recent Labs Lab  09/08/13 2000  AST 18  ALT 9  ALKPHOS 63  BILITOT 0.6  PROT 7.6  ALBUMIN 4.2   No results found for this basename: LIPASE, AMYLASE,  in the last 168 hours No results found for this basename: AMMONIA,  in the last 168 hours CBC:  Recent Labs Lab 09/08/13 2000 09/11/13 0450  WBC 13.4* 4.3  NEUTROABS 11.1*  --   HGB 12.7 9.3*  HCT 37.6 27.4*  MCV 89.3 89.5  PLT 185 114*   Cardiac Enzymes: No results found for this basename: CKTOTAL, CKMB, CKMBINDEX, TROPONINI,  in the last 168 hours BNP: BNP (last 3 results) No results found for this basename: PROBNP,  in the last 8760 hours CBG: No results found for this basename: GLUCAP,  in the last 168 hours     Signed:  Ruchel Brandenburger  [facility] 09/11/2013, 4:18 PM

## 2013-09-11 NOTE — Progress Notes (Signed)
CARE MANAGEMENT NOTE 09/11/2013  Patient:  Stacy Peterson, Stacy Peterson   Account Number:  [id number]  Date Initiated:  09/09/2013  Documentation initiated by:  DAVIS,RHONDA  Subjective/Objective Assessment:   pt with recent hx of untreatment std, now with pyleonephritis and sepsis     Action/Plan:   home when stable   Anticipated DC Date:  09/12/2013   Anticipated DC Plan:  HOME/SELF CARE  In-house referral  Financial Counselor      DC Planning Services  CM consult          Status of service:  In process, will continue to follow Medicare Important Message given?  NA - LOS <3 / Initial given by admissions (If response is "NO", the following Medicare IM given date fields will be blank)  Per UR Regulation:  Reviewed for med. necessity/level of care/duration of stay  Comments:  09/11/13 Algernon Huxley RN BSN [phone] Met with pt at bedside to discuss need to find a PCP and assistance with medications. I provided her with information on the [facility] [employer] on [address], I encouraged her to call and make an appointment so that she can establish care. Per MD pt will be discharged on Cipro 500 mg BID x 5 days and Flagyl 500 mg BID x 5 days. At [facility] outpatient pharmacy the Cipro is $ 9.00 and Flagy is $10.95. Pt stated she will ask her mother to assist her with paying for the meds.

## 2013-09-11 NOTE — Progress Notes (Signed)
This NP was called by pharmacist, here at [facility], stating patient got home from hospital today and could not locate her prescriptions. Pharmacist called pharmacy [facility]) and scripts had not been sent electronically either. Therefore, the following prescriptions were called to [facility] [phone]: Cipro 500mg  #10 take i po bid Flagyl 500mg  #10 take i po bid Bactroban 2% ointment 22gms AAA bid ------------------no refills on any of these. Our pharmacist here at [facility] called pt and made her aware that meds are being called in for her. Jimmye Norman, NP [facility]

## 2013-09-15 LAB — CULTURE, BLOOD (ROUTINE X 2): Culture: NO GROWTH

## 2013-09-17 NOTE — ED Provider Notes (Signed)
Medical screening examination/treatment/procedure(s) were performed by non-physician practitioner and as supervising physician I was immediately available for consultation/collaboration.   Richardean Canal, MD 09/17/13 [phone]

## 2013-11-25 ENCOUNTER — Emergency Department (HOSPITAL_COMMUNITY)
Admission: EM | Admit: 2013-11-25 | Discharge: 2013-11-25 | Disposition: A | Discharge status: Home / Self Care | Payer: Medicaid Other | Attending: Emergency Medicine | Admitting: Emergency Medicine

## 2013-11-25 ENCOUNTER — Emergency Department (HOSPITAL_COMMUNITY): Payer: Medicaid Other

## 2013-11-25 ENCOUNTER — Encounter (HOSPITAL_COMMUNITY): Payer: Self-pay | Admitting: Emergency Medicine

## 2013-11-25 DIAGNOSIS — F449 Dissociative and conversion disorder, unspecified: Secondary | ICD-10-CM | POA: Insufficient documentation

## 2013-11-25 DIAGNOSIS — F458 Other somatoform disorders: Secondary | ICD-10-CM

## 2013-11-25 DIAGNOSIS — Z87891 Personal history of nicotine dependence: Secondary | ICD-10-CM | POA: Insufficient documentation

## 2013-11-25 DIAGNOSIS — Z872 Personal history of diseases of the skin and subcutaneous tissue: Secondary | ICD-10-CM | POA: Insufficient documentation

## 2013-11-25 DIAGNOSIS — Z331 Pregnant state, incidental: Secondary | ICD-10-CM

## 2013-11-25 LAB — POCT I-STAT, CHEM 8
BUN: 5 mg/dL — ABNORMAL LOW (ref 6–23)
Calcium, Ion: 1.28 mmol/L — ABNORMAL HIGH (ref 1.12–1.23)
Chloride: 104 mEq/L (ref 96–112)
Creatinine, Ser: 0.7 mg/dL (ref 0.50–1.10)
Glucose, Bld: 91 mg/dL (ref 70–99)
HCT: 41 % (ref 36.0–46.0)
Hemoglobin: 13.9 g/dL (ref 12.0–15.0)
Potassium: 3.7 mEq/L (ref 3.5–5.1)
Sodium: 138 mEq/L (ref 135–145)
TCO2: 21 mmol/L (ref 0–100)

## 2013-11-25 LAB — PREGNANCY, URINE: Preg Test, Ur: POSITIVE — AB

## 2013-11-25 MED ORDER — SODIUM CHLORIDE 0.9 % IV BOLUS (SEPSIS)
1000.0000 mL | Freq: Once | INTRAVENOUS | Status: AC
  Administered 2013-11-25: 1000 mL via INTRAVENOUS

## 2013-11-25 MED ORDER — LORAZEPAM 2 MG/ML IJ SOLN
1.0000 mg | Freq: Once | INTRAMUSCULAR | Status: AC
  Administered 2013-11-25: 1 mg via INTRAVENOUS
  Filled 2013-11-25: qty 1

## 2013-11-25 MED ORDER — GLUCAGON HCL (RDNA) 1 MG IJ SOLR: 1.0000 mg | Freq: Once | INTRAMUSCULAR | Status: DC | PRN

## 2013-11-25 MED ORDER — GLUCAGON HCL (RDNA) 1 MG IJ SOLR
INTRAMUSCULAR | Status: AC
  Filled 2013-11-25: qty 1

## 2013-11-25 MED ORDER — GI COCKTAIL ~~LOC~~
30.0000 mL | Freq: Once | ORAL | Status: AC
  Administered 2013-11-25: 30 mL via ORAL
  Filled 2013-11-25: qty 30

## 2013-11-25 NOTE — ED Notes (Signed)
Pt states possible pregnant

## 2013-11-25 NOTE — ED Notes (Signed)
MD at bedside. 

## 2013-11-25 NOTE — ED Notes (Signed)
Pt c/o "something in her throat" woke her up in her sleep at 4am. Pt in no respiratory distress but dry heaving with nothing coming up. Pt able to control her salvia, pt anxious trying to cough but states it makes it worse.

## 2013-11-25 NOTE — Progress Notes (Signed)
[facility] provided pt with a list of primary care resources.  °

## 2013-12-01 NOTE — ED Provider Notes (Signed)
CSN: [id number]     Arrival date & time 11/25/13  1108 History   First MD Initiated Contact with Patient 11/25/13 1118     Chief Complaint  Patient presents with  . Oral Swelling   (Consider location/radiation/quality/duration/timing/severity/associated sxs/prior Treatment) HPI  A 22 year old female with foreign body sensation in throat. Onset last night while laying in bed. She went to sleep in her usual state of health. She woke up early this morning with the sensation that something was stuck in her throat. This occurred around 4 AM. Symptoms have been persistent since. She feels like she needs to clear her throat, but can't. No drooling. No vomiting. No wheezing. No history similar symptoms. No interventions prior to arrival. Denies any neck pain, throat pain or chest pain.  Past Medical History  Diagnosis Date  . Shingles    Past Surgical History  Procedure Laterality Date  . Knee surgery    . Knee surgery     No family history on file. History  Substance Use Topics  . Smoking status: Former Smoker -- 0.50 packs/day    Types: Cigarettes  . Smokeless tobacco: Never Used  . Alcohol Use: No   OB History   Grav Para Term Preterm Abortions TAB SAB Ect Mult Living   3 2 2  1 1    2      Review of Systems  All systems reviewed and negative, other than as noted in HPI.   Allergies  Review of patient's allergies indicates no known allergies.  Home Medications  No current outpatient prescriptions on file. BP 97/50  Pulse 84  Temp(Src) 98.3 F (36.8 C) (Oral)  Resp 19  SpO2 99%  LMP 10/18/2013 Physical Exam  Nursing note and vitals reviewed. Constitutional: She appears well-developed and well-nourished.  Sitting in bed. Appears anxious. Frequently touching anterior neck and clearing throat.   HENT:  Head: Normocephalic and atraumatic.  Posterior pharynx clear. No pooling of secretions. Can swallow on command. Thyroid feels normal in size. No adenopathy. No neck  asymetry or facial/neck swelling appreciated. No stridor. Normal sounding phonation.   Eyes: Conjunctivae are normal. Right eye exhibits no discharge. Left eye exhibits no discharge.  Neck: Neck supple.  Cardiovascular: Normal rate, regular rhythm and normal heart sounds.  Exam reveals no gallop and no friction rub.   No murmur heard. Pulmonary/Chest: Effort normal and breath sounds normal. No respiratory distress. She has no wheezes.  Abdominal: Soft. She exhibits no distension. There is no tenderness.  Musculoskeletal: She exhibits no edema and no tenderness.  Neurological: She is alert.  Skin: Skin is warm and dry.  Psychiatric: She has a normal mood and affect. Her behavior is normal. Thought content normal.    ED Course  Procedures (including critical care time) Labs Review Labs Reviewed  PREGNANCY, URINE - Abnormal; Notable for the following:    Preg Test, Ur POSITIVE (*)    All other components within normal limits  POCT I-STAT, CHEM 8 - Abnormal; Notable for the following:    BUN 5 (*)    Calcium, Ion 1.28 (*)    All other components within normal limits   Imaging Review No results found.  Dg Neck Soft Tissue  11/25/2013   CLINICAL DATA:  Throat pain, difficulty breathing  EXAM: NECK SOFT TISSUES - 1+ VIEW  COMPARISON:  None.  FINDINGS: There is no evidence of retropharyngeal soft tissue swelling or epiglottic enlargement. The cervical airway is unremarkable and no radio-opaque foreign body identified.  IMPRESSION: Negative.   Electronically Signed   By: Natasha Mead M.D.   On: 11/25/2013 11:49   EKG Interpretation   None       MDM   1. Globus hystericus   2. Pregnancy, incidental    22yF with FB sensation in throat. No objective evidence of possible FB or airway compromise. Onset while sleeping making esophageal FB extremely unlikely. XR w/o acute finding. Symptoms much improved, although not completely resolved prior to DC. Incidentally noted pregnancy. First  trimester. OB FU.    Raeford Razor, MD 12/01/13 [phone]

## 2013-12-25 ENCOUNTER — Emergency Department (HOSPITAL_COMMUNITY)
Admission: EM | Admit: 2013-12-25 | Discharge: 2013-12-25 | Disposition: A | Discharge status: Home / Self Care | Payer: Medicaid Other | Attending: Emergency Medicine | Admitting: Emergency Medicine

## 2013-12-25 ENCOUNTER — Emergency Department (HOSPITAL_COMMUNITY): Payer: Medicaid Other

## 2013-12-25 ENCOUNTER — Encounter (HOSPITAL_COMMUNITY): Payer: Self-pay | Admitting: Emergency Medicine

## 2013-12-25 DIAGNOSIS — Z87891 Personal history of nicotine dependence: Secondary | ICD-10-CM | POA: Insufficient documentation

## 2013-12-25 DIAGNOSIS — Z8619 Personal history of other infectious and parasitic diseases: Secondary | ICD-10-CM | POA: Insufficient documentation

## 2013-12-25 DIAGNOSIS — N390 Urinary tract infection, site not specified: Secondary | ICD-10-CM | POA: Insufficient documentation

## 2013-12-25 DIAGNOSIS — O9989 Other specified diseases and conditions complicating pregnancy, childbirth and the puerperium: Secondary | ICD-10-CM | POA: Insufficient documentation

## 2013-12-25 DIAGNOSIS — Z3202 Encounter for pregnancy test, result negative: Secondary | ICD-10-CM | POA: Insufficient documentation

## 2013-12-25 DIAGNOSIS — Z3490 Encounter for supervision of normal pregnancy, unspecified, unspecified trimester: Secondary | ICD-10-CM

## 2013-12-25 DIAGNOSIS — J029 Acute pharyngitis, unspecified: Secondary | ICD-10-CM | POA: Insufficient documentation

## 2013-12-25 DIAGNOSIS — O2390 Unspecified genitourinary tract infection in pregnancy, unspecified trimester: Secondary | ICD-10-CM | POA: Insufficient documentation

## 2013-12-25 DIAGNOSIS — M549 Dorsalgia, unspecified: Secondary | ICD-10-CM | POA: Insufficient documentation

## 2013-12-25 LAB — CBC WITH DIFFERENTIAL/PLATELET
Basophils Absolute: 0 K/uL (ref 0.0–0.1)
Basophils Relative: 0 % (ref 0–1)
Eosinophils Absolute: 0 K/uL (ref 0.0–0.7)
Eosinophils Relative: 0 % (ref 0–5)
HCT: 33 % — ABNORMAL LOW (ref 36.0–46.0)
Hemoglobin: 11.8 g/dL — ABNORMAL LOW (ref 12.0–15.0)
Lymphocytes Relative: 5 % — ABNORMAL LOW (ref 12–46)
Lymphs Abs: 0.4 K/uL — ABNORMAL LOW (ref 0.7–4.0)
MCH: 31.3 pg (ref 26.0–34.0)
MCHC: 35.8 g/dL (ref 30.0–36.0)
MCV: 87.5 fL (ref 78.0–100.0)
Monocytes Absolute: 0.4 K/uL (ref 0.1–1.0)
Monocytes Relative: 5 % (ref 3–12)
Neutro Abs: 7.4 K/uL (ref 1.7–7.7)
Neutrophils Relative %: 90 % — ABNORMAL HIGH (ref 43–77)
Platelets: 120 K/uL — ABNORMAL LOW (ref 150–400)
RBC: 3.77 MIL/uL — ABNORMAL LOW (ref 3.87–5.11)
RDW: 14.2 % (ref 11.5–15.5)
WBC: 8.2 K/uL (ref 4.0–10.5)

## 2013-12-25 LAB — COMPREHENSIVE METABOLIC PANEL WITH GFR
ALT: 11 U/L (ref 0–35)
AST: 13 U/L (ref 0–37)
Albumin: 3.1 g/dL — ABNORMAL LOW (ref 3.5–5.2)
Alkaline Phosphatase: 65 U/L (ref 39–117)
BUN: 5 mg/dL — ABNORMAL LOW (ref 6–23)
CO2: 20 meq/L (ref 19–32)
Calcium: 8.6 mg/dL (ref 8.4–10.5)
Chloride: 100 meq/L (ref 96–112)
Creatinine, Ser: 0.51 mg/dL (ref 0.50–1.10)
GFR calc Af Amer: >90 mL/min
GFR calc non Af Amer: >90 mL/min
Glucose, Bld: 97 mg/dL (ref 70–99)
Potassium: 3.3 meq/L — ABNORMAL LOW (ref 3.7–5.3)
Sodium: 134 meq/L — ABNORMAL LOW (ref 137–147)
Total Bilirubin: 0.2 mg/dL — ABNORMAL LOW (ref 0.3–1.2)
Total Protein: 6.6 g/dL (ref 6.0–8.3)

## 2013-12-25 LAB — URINALYSIS, ROUTINE W REFLEX MICROSCOPIC
Bilirubin Urine: NEGATIVE
Glucose, UA: NEGATIVE mg/dL
Ketones, ur: 40 mg/dL — AB
Nitrite: POSITIVE — AB
Protein, ur: 30 mg/dL — AB
Specific Gravity, Urine: 1.019 (ref 1.005–1.030)
Urobilinogen, UA: 0.2 mg/dL (ref 0.0–1.0)
pH: 7 (ref 5.0–8.0)

## 2013-12-25 LAB — WET PREP, GENITAL
Clue Cells Wet Prep HPF POC: NONE SEEN
TRICH WET PREP: NONE SEEN
Yeast Wet Prep HPF POC: NONE SEEN

## 2013-12-25 LAB — URINE MICROSCOPIC-ADD ON

## 2013-12-25 LAB — LIPASE, BLOOD: LIPASE: 16 U/L (ref 11–59)

## 2013-12-25 LAB — POCT PREGNANCY, URINE: Preg Test, Ur: POSITIVE — AB

## 2013-12-25 MED ORDER — MORPHINE SULFATE 4 MG/ML IJ SOLN
4.0000 mg | Freq: Once | INTRAMUSCULAR | Status: AC
  Administered 2013-12-25: 4 mg via INTRAVENOUS
  Filled 2013-12-25: qty 1

## 2013-12-25 MED ORDER — OXYCODONE-ACETAMINOPHEN 5-325 MG PO TABS: 2.0000 | ORAL_TABLET | Freq: Once | ORAL | Status: DC

## 2013-12-25 MED ORDER — SODIUM CHLORIDE 0.9 % IV BOLUS (SEPSIS)
1000.0000 mL | Freq: Once | INTRAVENOUS | Status: AC
  Administered 2013-12-25: 1000 mL via INTRAVENOUS

## 2013-12-25 MED ORDER — CEPHALEXIN 500 MG PO CAPS: ORAL_CAPSULE | ORAL | Status: DC

## 2013-12-25 MED ORDER — OXYCODONE-ACETAMINOPHEN 5-325 MG PO TABS: 1.0000 | ORAL_TABLET | Freq: Four times a day (QID) | ORAL | Status: DC | PRN

## 2013-12-25 MED ORDER — KETOROLAC TROMETHAMINE 30 MG/ML IJ SOLN
30.0000 mg | Freq: Once | INTRAMUSCULAR | Status: AC
  Administered 2013-12-25: 30 mg via INTRAVENOUS
  Filled 2013-12-25: qty 1

## 2013-12-25 MED ORDER — ONDANSETRON HCL 4 MG/2ML IJ SOLN
4.0000 mg | Freq: Once | INTRAMUSCULAR | Status: AC
  Administered 2013-12-25: 4 mg via INTRAVENOUS
  Filled 2013-12-25: qty 2

## 2013-12-25 MED ORDER — ONDANSETRON HCL 4 MG PO TABS: 4.0000 mg | ORAL_TABLET | Freq: Four times a day (QID) | ORAL | Status: DC

## 2013-12-25 NOTE — Discharge Instructions (Signed)
Pregnancy - First Trimester During sexual intercourse, millions of sperm go into the vagina. Only 1 sperm will penetrate and fertilize the female egg while it is in the Fallopian tube. One week later, the fertilized egg implants into the wall of the uterus. An embryo begins to develop into a baby. At 6 to 8 weeks, the eyes and face are formed and the heartbeat can be seen on ultrasound. At the end of 12 weeks (first trimester), all the baby's organs are formed. Now that you are pregnant, you will want to do everything you can to have a healthy baby. Two of the most important things are to get good prenatal care and follow your caregiver's instructions. Prenatal care is all the medical care you receive before the baby's birth. It is given to prevent, find, and treat problems during the pregnancy and childbirth. PRENATAL EXAMS  During prenatal visits, your weight, blood pressure, and urine are checked. This is done to make sure you are healthy and progressing normally during the pregnancy.  A pregnant woman should gain 25 to 35 pounds during the pregnancy. However, if you are overweight or underweight, your caregiver will advise you regarding your weight.  Your caregiver will ask and answer questions for you.  Blood work, cervical cultures, other necessary tests, and a Pap test are done during your prenatal exams. These tests are done to check on your health and the probable health of your baby. Tests are strongly recommended and done for HIV with your permission. This is the virus that causes AIDS. These tests are done because medicines can be given to help prevent your baby from being born with this infection should you have been infected without knowing it. Blood work is also used to find out your blood type, previous infections, and follow your blood levels (hemoglobin).  Low hemoglobin (anemia) is common during pregnancy. Iron and vitamins are given to help prevent this. Later in the pregnancy, blood  tests for diabetes will be done along with any other tests if any problems develop.  You may need other tests to make sure you and the baby are doing well. CHANGES DURING THE FIRST TRIMESTER  Your body goes through many changes during pregnancy. They vary from person to person. Talk to your caregiver about changes you notice and are concerned about. Changes can include:  Your menstrual period stops.  The egg and sperm carry the genes that determine what you look like. Genes from you and your partner are forming a baby. The female genes determine whether the baby is a boy or a girl.  Your body increases in girth and you may feel bloated.  Feeling sick to your stomach (nauseous) and throwing up (vomiting). If the vomiting is uncontrollable, call your caregiver.  Your breasts will begin to enlarge and become tender.  Your nipples may stick out more and become darker.  The need to urinate more. Painful urination may mean you have a bladder infection.  Tiring easily.  Loss of appetite.  Cravings for certain kinds of food.  At first, you may gain or lose a couple of pounds.  You may have changes in your emotions from day to day (excited to be pregnant or concerned something may go wrong with the pregnancy and baby).  You may have more vivid and strange dreams. HOME CARE INSTRUCTIONS   It is very important to avoid all smoking, alcohol and non-prescribed drugs during your pregnancy. These affect the formation and growth of the baby.  Avoid chemicals while pregnant to ensure the delivery of a healthy infant. °· Start your prenatal visits by the 12th week of pregnancy. They are usually scheduled monthly at first, then more often in the last 2 months before delivery. Keep your caregiver's appointments. Follow your caregiver's instructions regarding medicine use, blood and lab tests, exercise, and diet. °· During pregnancy, you are providing food for you and your baby. Eat regular, well-balanced  meals. Choose foods such as meat, fish, milk and other low fat dairy products, vegetables, fruits, and whole-grain breads and cereals. Your caregiver will tell you of the ideal weight gain. °· You can help morning sickness by keeping soda crackers at the bedside. Eat a couple before arising in the morning. You may want to use the crackers without salt on them. °· Eating 4 to 5 small meals rather than 3 large meals a day also may help the nausea and vomiting. °· Drinking liquids between meals instead of during meals also seems to help nausea and vomiting. °· A physical sexual relationship may be continued throughout pregnancy if there are no other problems. Problems may be early (premature) leaking of amniotic fluid from the membranes, vaginal bleeding, or belly (abdominal) pain. °· Exercise regularly if there are no restrictions. Check with your caregiver or physical therapist if you are unsure of the safety of some of your exercises. Greater weight gain will occur in the last 2 trimesters of pregnancy. Exercising will help: °· Control your weight. °· Keep you in shape. °· Prepare you for labor and delivery. °· Help you lose your pregnancy weight after you deliver your baby. °· Wear a good support or jogging bra for breast tenderness during pregnancy. This may help if worn during sleep too. °· Ask when prenatal classes are available. Begin classes when they are offered. °· Do not use hot tubs, steam rooms, or saunas. °· Wear your seat belt when driving. This protects you and your baby if you are in an accident. °· Avoid raw meat, uncooked cheese, cat litter boxes, and soil used by cats throughout the pregnancy. These carry germs that can cause birth defects in the baby. °· The first trimester is a good time to visit your dentist for your dental health. Getting your teeth cleaned is okay. Use a softer toothbrush and brush gently during pregnancy. °· Ask for help if you have financial, counseling, or nutritional needs  during pregnancy. Your caregiver will be able to offer counseling for these needs as well as refer you for other special needs. °· Do not take any medicines or herbs unless told by your caregiver. °· Inform your caregiver if there is any mental or physical domestic violence. °· Make a list of emergency phone numbers of family, friends, hospital, and police and fire departments. °· Write down your questions. Take them to your prenatal visit. °· Do not douche. °· Do not cross your legs. °· If you have to stand for long periods of time, rotate you feet or take small steps in a circle. °· You may have more vaginal secretions that may require a sanitary pad. Do not use tampons or scented sanitary pads. °MEDICINES AND DRUG USE IN PREGNANCY °· Take prenatal vitamins as directed. The vitamin should contain 1 milligram of folic acid. Keep all vitamins out of reach of children. Only a couple vitamins or tablets containing iron may be fatal to a baby or young child when ingested. °· Avoid use of all medicines, including herbs, over-the-counter medicines, not   prescribed or suggested by your caregiver. Only take over-the-counter or prescription medicines for pain, discomfort, or fever as directed by your caregiver. Do not use aspirin, ibuprofen, or naproxen unless directed by your caregiver.  Let your caregiver also know about herbs you may be using.  Alcohol is related to a number of birth defects. This includes fetal alcohol syndrome. All alcohol, in any form, should be avoided completely. Smoking will cause low birth rate and premature babies.  Street or illegal drugs are very harmful to the baby. They are absolutely forbidden. A baby born to an addicted mother will be addicted at birth. The baby will go through the same withdrawal an adult does.  Let your caregiver know about any medicines that you have to take and for what reason you take them. SEEK MEDICAL CARE IF:  You have any concerns or worries during your  pregnancy. It is better to call with your questions if you feel they cannot wait, rather than worry about them. SEEK IMMEDIATE MEDICAL CARE IF:   An unexplained oral temperature above 102 F (38.9 C) develops, or as your caregiver suggests.  You have leaking of fluid from the vagina (birth canal). If leaking membranes are suspected, take your temperature and inform your caregiver of this when you call.  There is vaginal spotting or bleeding. Notify your caregiver of the amount and how many pads are used.  You develop a bad smelling vaginal discharge with a change in the color.  You continue to feel sick to your stomach (nauseated) and have no relief from remedies suggested. You vomit blood or coffee ground-like materials.  You lose more than 2 pounds of weight in 1 week.  You gain more than 2 pounds of weight in 1 week and you notice swelling of your face, hands, feet, or legs.  You gain 5 pounds or more in 1 week (even if you do not have swelling of your hands, face, legs, or feet).  You get exposed to [place] measles and have never had them.  You are exposed to fifth disease or chickenpox.  You develop belly (abdominal) pain. Round ligament discomfort is a common non-cancerous (benign) cause of abdominal pain in pregnancy. Your caregiver still must evaluate this.  You develop headache, fever, diarrhea, pain with urination, or shortness of breath.  You fall or are in a car accident or have any kind of trauma.  There is mental or physical violence in your home. Document Released: 12/05/2001 Document Revised: 09/04/2012 Document Reviewed: 06/08/2009 [facility] Patient Information 2014 [place], [place].  Urinary Tract Infection A urinary tract infection (UTI) can occur any place along the urinary tract. The tract includes the kidneys, ureters, bladder, and urethra. A type of germ called bacteria often causes a UTI. UTIs are often helped with antibiotic medicine.  HOME CARE   If given,  take antibiotics as told by your doctor. Finish them even if you start to feel better.  Drink enough fluids to keep your pee (urine) clear or pale yellow.  Avoid tea, drinks with caffeine, and bubbly (carbonated) drinks.  Pee often. Avoid holding your pee in for a long time.  Pee before and after having sex (intercourse).  Wipe from front to back after you poop (bowel movement) if you are a woman. Use each tissue only once. GET HELP RIGHT AWAY IF:   You have back pain.  You have lower belly (abdominal) pain.  You have chills.  You feel sick to your stomach (nauseous).  You  throw up (vomit).  Your burning or discomfort with peeing does not go away.  You have a fever.  Your symptoms are not better in 3 days. MAKE SURE YOU:   Understand these instructions.  Will watch your condition.  Will get help right away if you are not doing well or get worse. Document Released: 05/29/2008 Document Revised: 09/04/2012 Document Reviewed: 07/11/2012 [facility] Patient Information 2014 [place], [place].   Emergency Department Resource Guide 1) Find a Doctor and Pay Out of Pocket Although you won't have to find out who is covered by your insurance plan, it is a good idea to ask around and get recommendations. You will then need to call the office and see if the doctor you have chosen will accept you as a new patient and what types of options they offer for patients who are self-pay. Some doctors offer discounts or will set up payment plans for their patients who do not have insurance, but you will need to ask so you aren't surprised when you get to your appointment.  2) Contact Your Local Health Department Not all health departments have doctors that can see patients for sick visits, but many do, so it is worth a call to see if yours does. If you don't know where your local health department is, you can check in your phone book. The CDC also has a tool to help you locate your state's health  department, and many state websites also have listings of all of their local health departments.  3) Find a Walk-in Clinic If your illness is not likely to be very severe or complicated, you may want to try a walk in clinic. These are popping up all over the country in pharmacies, drugstores, and shopping centers. They're usually staffed by nurse practitioners or physician assistants that have been trained to treat common illnesses and complaints. They're usually fairly quick and inexpensive. However, if you have serious medical issues or chronic medical problems, these are probably not your best option.  No Primary Care Doctor: - Call Health Connect at  [phone] - they can help you locate a primary care doctor that  accepts your insurance, provides certain services, etc. - Physician Referral Service- [phone]  Chronic Pain Problems: Organization         Address  Phone   Notes  [facility]  [phone] Patients need to be referred by their primary care doctor.   Medication Assistance: Organization         Address  Phone   Notes  [facility] Medication [facility] [address] [place], [place] [address] [phone] --Must be a resident of [facility] -- Must have NO insurance coverage whatsoever (no Medicaid/ Medicare, etc.) -- The pt. MUST have a primary care doctor that directs their care regularly and follows them in the community   [facility]  [phone]   [employer]  [phone]    Agencies that provide inexpensive medical care: Organization         Address  Phone   Notes  [facility]  [phone]   [facility]    [phone]   [facility] [address] [place], [place] [address] [phone]   [facility] [address] [place] [place] [phone]   Planned Parenthood    [phone]   [facility]    [phone]    [facility]  [address], [place] Phone:  [phone], Fax:  [phone] Hours of Operation:  9 am - 6 pm, M-F.  Also accepts Medicaid/Medicare and self-pay.  [facility]  [address], [place] Phone: [phone], Fax: [phone]. Hours of Operation:  8:30 am - 5:30 pm, M-F.  Also accepts Medicaid and self-pay.  [facility] [place] [address] [place] Phone: [phone]   [facility] [address] Natasha Bence [place], [place] [phone], Ext. [phone] Mondays & Thursdays: 7-9 AM.  First 15 patients are seen on a first come, first serve basis.    Medicaid-accepting [facility] [facility] Providers:  Organization         Address  Phone   Notes  [facility] [address], [place] [phone] Also accepts self-pay patients.  [facility] [address] Laurell Josephs [place], [place]  [phone]   [facility] [address], [place] [phone]   [facility] [address] [place] [phone]   Renaye Rakers [address], [place]   [phone] Only accepts [place] Access [place] patients after they have their name applied to their card.   Self-Pay (no insurance) in [facility]:  Organization         Address  Phone   Notes  Sickle Cell Patients, [facility] [address] [place], [place] [phone]   [facility] [address] [place], [place] [phone]   [facility] [place]  [address], [place] [phone]   [facility]/Dr. Osei-Bonsu  [address] [place] or [address], [place] [phone] Phone number for both [place] and [place] locations is the same.  Urgent Medical and [facility] [address] [place] [phone]    [facility] [address], [place] or [address] [phone]   [facility] [address] [place] [phone], phone; [phone], fax Sees patients 1st and 3rd Saturday of every month.  Must not qualify for public or private insurance (i.e. Medicaid, Medicare, [place] Health Choice, Veterans' Benefits)  Household income should be no more than 200% of the poverty level The clinic cannot treat you if you are pregnant or think you are pregnant  Sexually transmitted diseases are not treated at the clinic.    Dental Care: Organization         Address  Phone  Notes  [facility] [address] [place], [place] [phone] Accepts children up to age 25 who are enrolled in [place] or [place] Health Choice; pregnant women with a Medicaid card; and children who have applied for Medicaid or [place] Health Choice, but were declined, whose parents can pay a reduced fee at time of service.  [facility] Department of [facility]  [address], [place] [phone] Accepts children up to age 47 who are enrolled in [place] or [place] Health Choice; pregnant women with a Medicaid card; and children who have applied for Medicaid or  Health Choice, but were declined, whose parents can pay a reduced fee at time of service.  [facility]  [address] [place],  [place] [phone] Patients are seen by appointment only. Walk-ins are not accepted. [facility] will see patients 69 years of age and older. Monday - Tuesday (8am-5pm) Most Wednesdays (8:30-5pm) $30 per visit, cash only  [facility]  [address], [facility] [phone] Patients are seen by appointment only. Walk-ins are not accepted. [facility] will see patients 53 years of age and older. One Wednesday Evening (Monthly: Volunteer Based).   $30 per visit, cash only  [employer]  [phone] for adults; Children under age 30, call Graduate Pediatric Dentistry at [phone]. Children aged 74-14, please call [phone] to request a pediatric application.  Dental services are provided in all areas of dental care including fillings, crowns and bridges, complete and partial dentures, implants, gum treatment, root canals, and extractions. Preventive care is also provided. Treatment is provided to both adults and children. Patients are selected via a lottery and there is often a waiting list.   [facility] [address] [place]  [phone] [email or web address]   [facility] [address] [place], [place] [phone], Ext. [phone] Second and Fourth Thursday of each month, opens at 6:30 AM; Clinic ends at 9 AM.  Patients are seen on a first-come first-served basis, and a limited number are seen during each clinic.   [facility]  [address] Ether Griffins [place], [place] [phone]   Eligibility Requirements You must have lived in [place], [place], or [place] counties for at least the last three months.   You cannot be eligible for state or federal sponsored [place], including [employer], [place], or [employer].   You generally cannot be eligible for healthcare insurance through your employer.    How to apply: Eligibility screenings are held every Tuesday and Wednesday afternoon from 1:00 pm until 4:00 pm. You do not need an appointment for the interview!  [facility] [address], [place], [place] [phone]   [facility] Health Department  [phone]   [facility] Health Department  [phone]   [facility] Health Department  [phone]    Behavioral Health Resources in the Community: Intensive Outpatient Programs Organization         Address  Phone  Notes  [facility] [address] [place], [place] [phone]   [facility] [address] [place], [place] [phone]   [facility] [address] [place], [place]  [phone]   [facility] Health [address]  [place], [place] [phone] or [phone]   Substance Abuse Resources Organization         Address  Phone  Notes  [facility] Drug Services  [phone]   [facility]  [phone]   [facility] [place]  [phone]   Floydene Flock  [phone]   Residential & Outpatient Substance Abuse Program  [phone]   Psychological Services Organization         Address  Phone  Notes  [facility] Behavioral Health  [phone]   [facility] Services  [phone]   [facility] Mental Health [address] [place] [phone] or [phone]    Mobile Crisis Teams Organization         Address  Phone  Notes  [facility], Mobile Crisis Care Unit  [phone]   [facility]  [address] [place], [place] [phone]   Doristine Locks [phone]  [address] [place] [phone]    Self-Help/Support Groups Organization         Address  Phone             Notes  [facility] [place] - variety of support groups  [phone]- [phone] Call for more information  Narcotics Anonymous (NA), [facility] [address], [employer] [place]  2 meetings at this location   Statistician         Address  Phone  Notes  [facility] [address],    [place]  [phone]   [facility]  [address] [place] [address], [place], [place] [phone]   [facility] Facility [address] [place], [place] [phone] Admissions: 8am-3pm M-F  [facility] [address],    [place], [place] [phone]   [facility] [address] [place], [place], [place] [phone]   [facility] [address]  [place], [place] [phone]   [facility] - Intensive Outpatient [address] Laurell Joseph[address], [place], [place] [phone]   [facility] ([facility]iction [facility] [address] [place]  [place], [place] [phone] or [phone]   [facility] ([facility]) [address], [place], [place] [phone] Accepts Medicaid  [facility] [place] [facility] [address]  [place] [phone] Substance Abuse/Addiction Treatment   [facility] Organization         Address  Phone  Notes  [facility]  [phone]   Angie Fava, PhD [address] Ervin Knack [place], [place]   [phone] or [phone]   [facility] Behavioral   [address] [place], [place] [phone]   [facility] [address] [place], [place] [phone] Insurance/Medicaid/sponsorship through [facility] [address]                                    [place], [place] [phone] Therapy/tele-psych/case  [facility] [address], [place] [phone]    Dr. Lolly Mustache  [phone]   [facility]  [employer] Surgical Dept. 1) [address],  2) [address], [place] 3)  [address], [place] [phone]  [phone]   [facility] Child Abuse Hotline 902-186-0130 or 6403247743 (After Hours)

## 2013-12-25 NOTE — ED Provider Notes (Signed)
CSN: [id number]     Arrival date & time 12/25/13  1430 History   First MD Initiated Contact with Patient 12/25/13 1541     Chief Complaint  Patient presents with  . Abdominal Pain  . Nausea   (Consider location/radiation/quality/duration/timing/severity/associated sxs/prior Treatment) HPI Comments: Patient is a G82 P60 23 year old female who presents today for left lower abdominal pain. She reports that this all began when she felt like she got a cold a week ago. She had a gradual onset headache, sinus pressure, congestion, sore throat, nausea. She still has a headache. Over the past 2 days she developed a pressure to her left lower abdomen. This gradually worsened. The pain radiates from her left lower quadrant into her back. Pain is worse with urination. She feels like she cannot empty completely when she urinates. She has been taking ibuprofen without relief. She denies any vaginal discharge, vaginal bleeding, hematuria, upper abdominal pain, and diarrhea. She is 2 months pregnant.  Patient is a 23 y.o. female presenting with abdominal pain. The history is provided by the patient. No language interpreter was used.  Abdominal Pain Associated symptoms: dysuria and nausea   Associated symptoms: no chills, no fever, no hematuria, no shortness of breath, no vaginal bleeding, no vaginal discharge and no vomiting     Past Medical History  Diagnosis Date  . Shingles    Past Surgical History  Procedure Laterality Date  . Knee surgery    . Knee surgery     No family history on file. History  Substance Use Topics  . Smoking status: Former Smoker -- 0.50 packs/day    Types: Cigarettes  . Smokeless tobacco: Never Used  . Alcohol Use: No   OB History   Grav Para Term Preterm Abortions TAB SAB Ect Mult Living   3 2 2  1 1    2      Review of Systems  Constitutional: Negative for fever and chills.  Respiratory: Negative for shortness of breath.   Gastrointestinal: Positive for nausea and  abdominal pain. Negative for vomiting.  Genitourinary: Positive for dysuria, flank pain, difficulty urinating and pelvic pain. Negative for hematuria, vaginal bleeding, vaginal discharge and vaginal pain.  Musculoskeletal: Positive for back pain.  All other systems reviewed and are negative.    Allergies  Review of patient's allergies indicates no known allergies.  Home Medications   Current Outpatient Rx  Name  Route  Sig  Dispense  Refill  . ibuprofen (ADVIL,MOTRIN) 200 MG tablet   Oral   Take 600 mg by mouth every 6 (six) hours as needed for mild pain or moderate pain.          BP 100/78  Pulse 99  Temp(Src) 98.5 F (36.9 C)  Resp 22  SpO2 99%  LMP 10/18/2013 Physical Exam  Nursing note and vitals reviewed. Constitutional: She is oriented to person, place, and time. She appears well-developed and well-nourished. She appears distressed.  HENT:  Head: Normocephalic and atraumatic.  Right Ear: External ear normal.  Left Ear: External ear normal.  Nose: Nose normal.  Mouth/Throat: Oropharynx is clear and moist.  Eyes: Conjunctivae are normal.  Neck: Normal range of motion.  Cardiovascular: Normal rate, regular rhythm and normal heart sounds.   Pulmonary/Chest: Effort normal and breath sounds normal. No stridor. No respiratory distress. She has no wheezes. She has no rales.  Abdominal: Soft. She exhibits no distension. There is tenderness in the suprapubic area and left lower quadrant. There is no rigidity, no  rebound, no guarding and no CVA tenderness.  Genitourinary: No labial fusion. There is no rash, tenderness, lesion or injury on the right labia. There is no rash, tenderness, lesion or injury on the left labia. Cervix exhibits discharge (milky white). Cervix exhibits no motion tenderness and no friability. Right adnexum displays no mass, no tenderness and no fullness. Left adnexum displays tenderness. Left adnexum displays no mass and no fullness.  Musculoskeletal:  Normal range of motion.       Back:  Neurological: She is alert and oriented to person, place, and time. She has normal strength.  Skin: Skin is warm and dry. She is not diaphoretic. No erythema.  Psychiatric: She has a normal mood and affect. Her behavior is normal.    ED Course  Procedures (including critical care time) Labs Review Labs Reviewed  WET PREP, GENITAL - Abnormal; Notable for the following:    WBC, Wet Prep HPF POC FEW (*)    All other components within normal limits  URINALYSIS, ROUTINE W REFLEX MICROSCOPIC - Abnormal; Notable for the following:    APPearance CLOUDY (*)    Hgb urine dipstick SMALL (*)    Ketones, ur 40 (*)    Protein, ur 30 (*)    Nitrite POSITIVE (*)    Leukocytes, UA LARGE (*)    All other components within normal limits  CBC WITH DIFFERENTIAL - Abnormal; Notable for the following:    RBC 3.77 (*)    Hemoglobin 11.8 (*)    HCT 33.0 (*)    Platelets 120 (*)    Neutrophils Relative % 90 (*)    Lymphocytes Relative 5 (*)    Lymphs Abs 0.4 (*)    All other components within normal limits  COMPREHENSIVE METABOLIC PANEL - Abnormal; Notable for the following:    Sodium 134 (*)    Potassium 3.3 (*)    BUN 5 (*)    Albumin 3.1 (*)    Total Bilirubin 0.2 (*)    All other components within normal limits  URINE MICROSCOPIC-ADD ON - Abnormal; Notable for the following:    Squamous Epithelial / LPF FEW (*)    Bacteria, UA MANY (*)    All other components within normal limits  POCT PREGNANCY, URINE - Abnormal; Notable for the following:    Preg Test, Ur POSITIVE (*)    All other components within normal limits  URINE CULTURE  GC/CHLAMYDIA PROBE AMP  LIPASE, BLOOD   Imaging Review US Ob Comp Less 14 Wks  12/25/2013   CLINICAL DATA:  Left flank pain.  Early 2nd trimester pregnancy.  EXAM: OBSTETRIC <14 WK [place] AND TRANSVAGINAL OB US  TECHNIQUE: Both transabdominal and transvaginal ultrasound examinations were performed for complete evaluation of the  gestation as well as the maternal uterus, adnexal regions, and pelvic cul-de-sac. Transvaginal technique was performed to assess early pregnancy.  FINDINGS: Intrauterine gestational sac: Visualized/normal in shape.  Yolk sac:  Not seen.  Embryo:  Present  Cardiac Activity: Present  Heart Rate:  157 bpm  MSD:    mm    w     d  CRL:   8.5 cm  mm   14 w 3 d                  [place] EDC: 09/19/2014  Maternal uterus/adnexae: Maternal ovaries not visualized. No free pelvic fluid. No subchorionic hemorrhage observed. Maternal cervix closed and measuring 4.6 cm in length.  IMPRESSION: 1. Single living intrauterine pregnancy measuring  at 14 weeks, 3 days gestation.   Electronically Signed   By: Herbie BaltimoreWalt  Liebkemann M.D.   On: 12/25/2013 20:38   [place] Ob Transvaginal  12/25/2013   CLINICAL DATA:  Left flank pain.  Early 2nd trimester pregnancy.  EXAM: OBSTETRIC <14 WK US AND TRANSVAGINAL OB US  TECHNIQUE: Both transabdominal and transvaginal ultrasound examinations were performed for complete evaluation of the gestation as well as the maternal uterus, adnexal regions, and pelvic cul-de-sac. Transvaginal technique was performed to assess early pregnancy.  FINDINGS: Intrauterine gestational sac: Visualized/normal in shape.  Yolk sac:  Not seen.  Embryo:  Present  Cardiac Activity: Present  Heart Rate:  157 bpm  MSD:    mm    w     d  CRL:   8.5 cm  mm   14 w 3 d                  US EDC: 09/19/2014  Maternal uterus/adnexae: Maternal ovaries not visualized. No free pelvic fluid. No subchorionic hemorrhage observed. Maternal cervix closed and measuring 4.6 cm in length.  IMPRESSION: 1. Single living intrauterine pregnancy measuring at 14 weeks, 3 days gestation.   Electronically Signed   By: Herbie BaltimoreWalt  Liebkemann M.D.   On: 12/25/2013 20:38    EKG Interpretation   None       MDM   1. Pregnancy   2. UTI (lower urinary tract infection)    Patient presents to ED with left sided flank pain. No CVA tenderness. Patient is afebrile  without vomiting. Dx with UTI. Pt also pregnant without any prenatal care. [facility]. She is waiting until she gets medicare to do this. Ultrasound shows a single living intrauterine pregnancy measuring at 14 weeks, 3 days gestation. I discussed this with the patient. I also discussed that she should not take ibuprofen at home for her pain and instead take tylenol. She was also given rx for 6 percocet tablets. She was told to use this sparingly as opiates can be harmful to the fetus. Patient expressed understanding. Return instructions given. Vital signs stable for discharge. I discussed this case with Dr. Effie ShyWentz who agrees with plan.      Mora BellmanHannah S Lorelie Biermann, PA-C 12/26/13 [id number]

## 2013-12-25 NOTE — ED Notes (Signed)
Pt is 2 months preg and states that she feels pressure and pain to lower abd, denies any vaginal bleeding , no discharge states that the pain began this am, tearful and bending over holding abd.

## 2013-12-25 NOTE — L&D Delivery Note (Signed)
Delivery Note At 5:22 PM a viable female was delivered via Vaginal, Spontaneous Delivery (Presentation: Left Occiput Anterior).  APGAR: 9, 9; weight .   Placenta status: Intact, Spontaneous.  Cord: 3 vessels with the following complications: None.  Cord pH: NA  Anesthesia: Epidural  Episiotomy: None Lacerations: None Suture Repair: NA Est. Blood Loss (mL): 300  Mom to postpartum.  Baby to Couplet care / Skin to Skin.  Called to delivery. Mother pushed over intact perineum. Infant delivered to maternal abdomen. Cord clamped and cut. Active management of 3rd stage with traction and Pitocin. Placenta delivered intact with 3v cord.  in usual manner. EBL300. Counts correct. Hemostatic.   Tawana ScaleMichael Ryan Lolita Faulds, MD OB Fellow 06/24/2014, 5:38 PM

## 2013-12-25 NOTE — ED Notes (Signed)
Gave pt 2 more glasses of water, notified RN

## 2013-12-26 ENCOUNTER — Emergency Department (HOSPITAL_COMMUNITY)
Admission: EM | Admit: 2013-12-26 | Discharge: 2013-12-26 | Disposition: A | Discharge status: Home / Self Care | Payer: Medicaid Other | Attending: Emergency Medicine | Admitting: Emergency Medicine

## 2013-12-26 ENCOUNTER — Emergency Department (HOSPITAL_COMMUNITY): Payer: Self-pay

## 2013-12-26 ENCOUNTER — Emergency Department (HOSPITAL_COMMUNITY): Payer: Medicaid Other

## 2013-12-26 ENCOUNTER — Encounter (HOSPITAL_COMMUNITY): Payer: Self-pay | Admitting: Emergency Medicine

## 2013-12-26 DIAGNOSIS — O2390 Unspecified genitourinary tract infection in pregnancy, unspecified trimester: Secondary | ICD-10-CM | POA: Insufficient documentation

## 2013-12-26 DIAGNOSIS — Z3490 Encounter for supervision of normal pregnancy, unspecified, unspecified trimester: Secondary | ICD-10-CM

## 2013-12-26 DIAGNOSIS — N390 Urinary tract infection, site not specified: Secondary | ICD-10-CM | POA: Insufficient documentation

## 2013-12-26 DIAGNOSIS — Z87891 Personal history of nicotine dependence: Secondary | ICD-10-CM | POA: Insufficient documentation

## 2013-12-26 DIAGNOSIS — M549 Dorsalgia, unspecified: Secondary | ICD-10-CM | POA: Insufficient documentation

## 2013-12-26 DIAGNOSIS — R109 Unspecified abdominal pain: Secondary | ICD-10-CM | POA: Insufficient documentation

## 2013-12-26 DIAGNOSIS — Z8619 Personal history of other infectious and parasitic diseases: Secondary | ICD-10-CM | POA: Insufficient documentation

## 2013-12-26 DIAGNOSIS — Z79899 Other long term (current) drug therapy: Secondary | ICD-10-CM | POA: Insufficient documentation

## 2013-12-26 LAB — URINALYSIS, ROUTINE W REFLEX MICROSCOPIC
BILIRUBIN URINE: NEGATIVE
Glucose, UA: NEGATIVE mg/dL
Ketones, ur: >80 mg/dL — AB
Nitrite: NEGATIVE
PH: 6.5 (ref 5.0–8.0)
Protein, ur: 30 mg/dL — AB
SPECIFIC GRAVITY, URINE: 1.007 (ref 1.005–1.030)
Urobilinogen, UA: 0.2 mg/dL (ref 0.0–1.0)

## 2013-12-26 LAB — URINE MICROSCOPIC-ADD ON

## 2013-12-26 LAB — PREGNANCY, URINE: Preg Test, Ur: POSITIVE — AB

## 2013-12-26 MED ORDER — SODIUM CHLORIDE 0.9 % IV BOLUS (SEPSIS)
1000.0000 mL | Freq: Once | INTRAVENOUS | Status: AC
  Administered 2013-12-26: 1000 mL via INTRAVENOUS

## 2013-12-26 MED ORDER — HYDROCODONE-ACETAMINOPHEN 5-325 MG PO TABS: 1.0000 | ORAL_TABLET | ORAL | Status: DC | PRN

## 2013-12-26 MED ORDER — NITROFURANTOIN MONOHYD MACRO 100 MG PO CAPS: 100.0000 mg | ORAL_CAPSULE | Freq: Two times a day (BID) | ORAL | Status: DC

## 2013-12-26 MED ORDER — CEFTRIAXONE SODIUM 1 G IJ SOLR
1.0000 g | Freq: Once | INTRAMUSCULAR | Status: AC
  Administered 2013-12-26: 1 g via INTRAVENOUS
  Filled 2013-12-26: qty 10

## 2013-12-26 MED ORDER — MORPHINE SULFATE 4 MG/ML IJ SOLN
4.0000 mg | INTRAMUSCULAR | Status: DC | PRN
  Administered 2013-12-26 (×2): 4 mg via INTRAVENOUS
  Filled 2013-12-26 (×2): qty 1

## 2013-12-26 MED ORDER — ONDANSETRON HCL 4 MG/2ML IJ SOLN
4.0000 mg | Freq: Once | INTRAMUSCULAR | Status: AC
  Administered 2013-12-26: 4 mg via INTRAVENOUS
  Filled 2013-12-26: qty 2

## 2013-12-26 MED ORDER — ONDANSETRON 4 MG PO TBDP: 4.0000 mg | ORAL_TABLET | Freq: Three times a day (TID) | ORAL | Status: DC | PRN

## 2013-12-26 NOTE — ED Notes (Signed)
Pt c/o flank pain that has been going on for three days. Pt denies n/v/d. Pt was seen here yesterday for same pain not able to sleep or "do anything with this pain".

## 2013-12-26 NOTE — Progress Notes (Signed)
   CARE MANAGEMENT ED NOTE 12/26/2013  Patient:  Nechama GuardSAWNEY,Glessie J   Account Number:  [id number]  Date Initiated:  12/26/2013  Documentation initiated by:  Edd ArbourGIBBS,KIMBERLY  Subjective/Objective Assessment:   23 yr old female self pay pt without pcp listed Seen by [facility] staff Flank pain [redacted] weeks pregnant Dx UTI     Subjective/Objective Assessment Detail:     Action/Plan:   see below notes   Action/Plan Detail:   Cristy FriedlanderStacy Wright Service: (none) Author Type: (none)    Filed: 11/25/2013 12:52 PM Note Time: 11/25/2013 12:52 PM Status: Signed    Editor: Cristy FriedlanderStacy Wright    [facility] CL provided pt with a list of primary care resources.   Anticipated DC Date:  12/26/2013     Status Recommendation to Physician:   Result of Recommendation:    Other ED Services  Consult Working Plan    DC Planning Services  Other  PCP issues  Outpatient Services - Pt will follow up  Medication Assistance  MATCH Program  [facility] (established/new)    Choice offered to / List presented to:            Status of service:  Completed, signed off  ED Comments:   ED Comments Detail:  ED CM consulted by EDP, Fayrene FearingJames for medication assistance CM reviewed EPIC notes and chart review information CM spoke with the pt about [facility] ($3 co pay for each Rx [facility], does not include refills, 7 day expiration of MATCH letter and choice of pharmacies) Pt agreed to receive assistance from program CM spoke with pt who [place]. CM discussed and provided written information for self pay pcps, importance of pcp for f/u care, www.needymeds.org, discounted pharmacies and other [place] resources such as financial assistance, DSS and  health department  [place]ed resources [place] [facility] like [facility], [facility] at [address], [facility], general medical clinics, [facility] plus others, [facility] out [facility]ies,  housing, and other resources [place]. Pt voiced understanding and appreciation of resources provided Pt is eligible for [facility] (unable to find pt listed in PDMI per cardholder name inquiry) PDMI information entered. MATCH letter completed and provided to pt. CM updated EDP and ED RN Pending confirmation of d/c medication list

## 2013-12-26 NOTE — ED Notes (Signed)
BP 90/52. Pt requesting morphine. Held because pressure is low.

## 2013-12-26 NOTE — ED Notes (Signed)
Pt states that she is [redacted] weeks pregnant and believes last menstrual cycle was in October 2014

## 2013-12-26 NOTE — ED Provider Notes (Signed)
CSN: [id number]     Arrival date & time 12/26/13  1424 History   First MD Initiated Contact with Patient 12/26/13 1457     Chief Complaint  Patient presents with  . Flank Pain    HPI  Is a 23 year old female G4 V2 currently approximately [redacted] weeks pregnant. Seen and evaluated yesterday. Has had several days of symptoms with some lower abdominal left flank and suprapubic discomfort. Her ultrasound just a confirming a viable intrauterine pregnancy. Urine appeared obviously infected. Was discharged with prescription for pain medicine and antibiotics. She returns today with pain she states she didn't have any money and her mother was unable to help her and she was unable to fill her medications for either the antibiotics for the pain medications. She presents here. She's had pain. No fever. No vaginal bleeding or discharge.  Past Medical History  Diagnosis Date  . Shingles    Past Surgical History  Procedure Laterality Date  . Knee surgery    . Knee surgery     No family history on file. History  Substance Use Topics  . Smoking status: Former Smoker -- 0.50 packs/day    Types: Cigarettes  . Smokeless tobacco: Never Used  . Alcohol Use: No   OB History   Grav Para Term Preterm Abortions TAB SAB Ect Mult Living   4 2 2  1 1    2      Review of Systems  Constitutional: Negative for fever, chills, diaphoresis, appetite change and fatigue.  HENT: Negative for mouth sores, sore throat and trouble swallowing.   Eyes: Negative for visual disturbance.  Respiratory: Negative for cough, chest tightness, shortness of breath and wheezing.   Cardiovascular: Negative for chest pain.  Gastrointestinal: Positive for nausea. Negative for vomiting, abdominal pain, diarrhea and abdominal distention.  Endocrine: Negative for polydipsia, polyphagia and polyuria.  Genitourinary: Positive for dysuria, urgency, difficulty urinating and pelvic pain. Negative for frequency, hematuria, vaginal bleeding and  vaginal pain.  Musculoskeletal: Positive for back pain. Negative for gait problem.  Skin: Negative for color change, pallor and rash.  Neurological: Negative for dizziness, syncope, light-headedness and headaches.  Hematological: Does not bruise/bleed easily.  Psychiatric/Behavioral: Negative for behavioral problems and confusion.    Allergies  Review of patient's allergies indicates no known allergies.  Home Medications   Current Outpatient Rx  Name  Route  Sig  Dispense  Refill  . cephALEXin (KEFLEX) 500 MG capsule      2 caps po bid x 7 days   28 capsule   0   . ibuprofen (ADVIL,MOTRIN) 200 MG tablet   Oral   Take 600 mg by mouth every 6 (six) hours as needed for mild pain or moderate pain.         Marland Kitchen. ondansetron (ZOFRAN) 4 MG tablet   Oral   Take 1 tablet (4 mg total) by mouth every 6 (six) hours.   12 tablet   0   . oxyCODONE-acetaminophen (PERCOCET/ROXICET) 5-325 MG per tablet   Oral   Take 1-2 tablets by mouth every 6 (six) hours as needed for severe pain.   6 tablet   0   . HYDROcodone-acetaminophen (NORCO/VICODIN) 5-325 MG per tablet   Oral   Take 1 tablet by mouth every 4 (four) hours as needed.   10 tablet   0   . nitrofurantoin, macrocrystal-monohydrate, (MACROBID) 100 MG capsule   Oral   Take 1 capsule (100 mg total) by mouth 2 (two) times daily.  20 capsule   0   . ondansetron (ZOFRAN ODT) 4 MG disintegrating tablet   Oral   Take 1 tablet (4 mg total) by mouth every 8 (eight) hours as needed for nausea.   6 tablet   0    BP 105/56  Pulse 116  Temp(Src) 99.2 F (37.3 C) (Oral)  Resp 18  SpO2 99%  LMP 10/18/2013 Physical Exam  Constitutional: She is oriented to person, place, and time. She appears well-developed and well-nourished. No distress.  Crying. She appears uncomfortable moving from the wheelchair to the bed.  HENT:  Head: Normocephalic.  Eyes: Conjunctivae are normal. Pupils are equal, round, and reactive to light. No scleral  icterus.  Neck: Normal range of motion. Neck supple. No thyromegaly present.  Cardiovascular: Normal rate and regular rhythm.  Exam reveals no gallop and no friction rub.   No murmur heard. Pulmonary/Chest: Effort normal and breath sounds normal. No respiratory distress. She has no wheezes. She has no rales.  Abdominal: Soft. Bowel sounds are normal. She exhibits no distension. There is no tenderness. There is no rebound.  Twitches eupneic abdomen and states that she feels pressure like she constantly urinating. She is crying. She does not have peritoneal abdomen. She does not have left flank pain to percuss but states she does feel pain, however it is not reproduced.  Musculoskeletal: Normal range of motion.  Neurological: She is alert and oriented to person, place, and time.  Skin: Skin is warm and dry. No rash noted.  Psychiatric: She has a normal mood and affect. Her behavior is normal.    ED Course  Procedures (including critical care time) Labs Review Labs Reviewed  URINALYSIS, ROUTINE W REFLEX MICROSCOPIC - Abnormal; Notable for the following:    APPearance CLOUDY (*)    Hgb urine dipstick TRACE (*)    Ketones, ur >80 (*)    Protein, ur 30 (*)    Leukocytes, UA SMALL (*)    All other components within normal limits  PREGNANCY, URINE - Abnormal; Notable for the following:    Preg Test, Ur POSITIVE (*)    All other components within normal limits  URINE MICROSCOPIC-ADD ON - Abnormal; Notable for the following:    Squamous Epithelial / LPF FEW (*)    All other components within normal limits   Imaging Review US Renal  12/26/2013   CLINICAL DATA:  Hematuria and left leg pain.  Pregnant.  EXAM: RENAL/URINARY TRACT ULTRASOUND COMPLETE  COMPARISON:  None.  FINDINGS: Right Kidney:  Length: 13.0 cm. Normal renal cortical thickness and echogenicity without hydronephrosis.  Left Kidney:  Length: 13.5 cm. Normal renal cortical thickness and echogenicity without hydronephrosis. A small  amount of perinephric fluid noted around the lower pole region.  Bladder:  Layering echogenic debris floating in the bladder.  IMPRESSION: 1. Normal sonographic appearance of both kidneys. No hydronephrosis. 2. Small amount of left-sided perinephric fluid. 3. Layering echogenic debris in the bladder.   Electronically Signed   By: Loralie Champagne M.D.   On: 12/26/2013 18:45    EKG Interpretation   None       MDM   1. Pregnancy   2. UTI (lower urinary tract infection)     I reviewed her station yesterday. Her ultrasounds are definitive that this is intrauterine viable. No pain or bleeding or symptoms of suggested otherwise at this point. Her urine appears obviously infected yesterday. I think this is a puncture urinary tract infection. I do not feel this  represents stone. She only a few red blood cells and too numerous to count white blood cells. Plan will be IV pain medication antibiotics. I discussed the case with care manager regarding assistance with her medications and therapy.    Rolland Porter, MD 12/28/13 1459

## 2013-12-26 NOTE — Progress Notes (Signed)
                  Dear ____SAWNEY,Ayanna J_______________:  Bonita QuinYou have been approved to have your discharge prescriptions filled through our [facility] (Medication [facility] Through [facility]) program. This program allows for a one-time (no refills) 34-day supply of selected medications for a low copay amount.  The copay is $3.00 per prescription. For instance, if you have one prescription, you will pay $3.00; for two prescriptions, you pay $6.00; for three prescriptions, you pay $9.00; and so on.  Only certain pharmacies are participating in this program with [facility]. You will need to select one of the pharmacies from the attached list and take your prescriptions, this letter, and your photo ID to one of the participating pharmacies.   We are excited that you are able to use the [facility] program to get your medications. These prescriptions must be filled within 7 days of hospital discharge or they will no longer be valid for the [facility] program. Should you have any problems with your prescriptions please contact your case management team member at [phone].  Thank you,   [facility]   Participating [facility] Pharmacies  [facility]   [facility] [id number] [place], [place]   [facility] [address] [place]   [facility] [address] [place], [place]   [facility]   [address], [place], [place]   [id number], [place], [place]   [address], [place], [place]   [id number] [address], [place], [place]   [id number] [address], [place], [place]   [id number] [address], [place], [place]   [address], [place], [place]   1040 [address], [place], [place]   [address], [place], [place]   [id number], [place], [place]  [facility]   [address], [place], [place]   [id number] [address] [place], [place]   [id number], [place], [place]   [id number] [address], [place], [place]   [address], [place], [place]   [id number] [place] [address], [place], [place] [facility]   [address], [place], [place]   [address], [place], [place]   [id number] [address], [place], [place]   [address], [place], [place]   [address], [place], [place]   3703 [address], [place], [place]   1600 [address], [place], [place]   [address], [place], [place]   [id number] [address], [place], [place]   9[address], [place], [place]   [address]58 [address], [place], [place]   340 [address], [place], [place]   603 [address], [place], [place]   [id number], [place], [place]  Independent Pharmacies   [facility] [address], [address] [place], [place]   [facility] [address] [place], [place]   [facility] [address] [place], [place]   For continued medication needs, please contact the [facility] at  [phone].

## 2013-12-26 NOTE — Discharge Instructions (Signed)
Pregnancy - First Trimester During sexual intercourse, millions of sperm go into the vagina. Only 1 sperm will penetrate and fertilize the female egg while it is in the Fallopian tube. One week later, the fertilized egg implants into the wall of the uterus. An embryo begins to develop into a baby. At 6 to 8 weeks, the eyes and face are formed and the heartbeat can be seen on ultrasound. At the end of 12 weeks (first trimester), all the baby's organs are formed. Now that you are pregnant, you will want to do everything you can to have a healthy baby. Two of the most important things are to get good prenatal care and follow your caregiver's instructions. Prenatal care is all the medical care you receive before the baby's birth. It is given to prevent, find, and treat problems during the pregnancy and childbirth. PRENATAL EXAMS  During prenatal visits, your weight, blood pressure, and urine are checked. This is done to make sure you are healthy and progressing normally during the pregnancy.  A pregnant woman should gain 25 to 35 pounds during the pregnancy. However, if you are overweight or underweight, your caregiver will advise you regarding your weight.  Your caregiver will ask and answer questions for you.  Blood work, cervical cultures, other necessary tests, and a Pap test are done during your prenatal exams. These tests are done to check on your health and the probable health of your baby. Tests are strongly recommended and done for HIV with your permission. This is the virus that causes AIDS. These tests are done because medicines can be given to help prevent your baby from being born with this infection should you have been infected without knowing it. Blood work is also used to find out your blood type, previous infections, and follow your blood levels (hemoglobin).  Low hemoglobin (anemia) is common during pregnancy. Iron and vitamins are given to help prevent this. Later in the pregnancy, blood  tests for diabetes will be done along with any other tests if any problems develop.  You may need other tests to make sure you and the baby are doing well. CHANGES DURING THE FIRST TRIMESTER  Your body goes through many changes during pregnancy. They vary from person to person. Talk to your caregiver about changes you notice and are concerned about. Changes can include:  Your menstrual period stops.  The egg and sperm carry the genes that determine what you look like. Genes from you and your partner are forming a baby. The female genes determine whether the baby is a boy or a girl.  Your body increases in girth and you may feel bloated.  Feeling sick to your stomach (nauseous) and throwing up (vomiting). If the vomiting is uncontrollable, call your caregiver.  Your breasts will begin to enlarge and become tender.  Your nipples may stick out more and become darker.  The need to urinate more. Painful urination may mean you have a bladder infection.  Tiring easily.  Loss of appetite.  Cravings for certain kinds of food.  At first, you may gain or lose a couple of pounds.  You may have changes in your emotions from day to day (excited to be pregnant or concerned something may go wrong with the pregnancy and baby).  You may have more vivid and strange dreams. HOME CARE INSTRUCTIONS   It is very important to avoid all smoking, alcohol and non-prescribed drugs during your pregnancy. These affect the formation and growth of the baby.  Avoid chemicals while pregnant to ensure the delivery of a healthy infant. °· Start your prenatal visits by the 12th week of pregnancy. They are usually scheduled monthly at first, then more often in the last 2 months before delivery. Keep your caregiver's appointments. Follow your caregiver's instructions regarding medicine use, blood and lab tests, exercise, and diet. °· During pregnancy, you are providing food for you and your baby. Eat regular, well-balanced  meals. Choose foods such as meat, fish, milk and other low fat dairy products, vegetables, fruits, and whole-grain breads and cereals. Your caregiver will tell you of the ideal weight gain. °· You can help morning sickness by keeping soda crackers at the bedside. Eat a couple before arising in the morning. You may want to use the crackers without salt on them. °· Eating 4 to 5 small meals rather than 3 large meals a day also may help the nausea and vomiting. °· Drinking liquids between meals instead of during meals also seems to help nausea and vomiting. °· A physical sexual relationship may be continued throughout pregnancy if there are no other problems. Problems may be early (premature) leaking of amniotic fluid from the membranes, vaginal bleeding, or belly (abdominal) pain. °· Exercise regularly if there are no restrictions. Check with your caregiver or physical therapist if you are unsure of the safety of some of your exercises. Greater weight gain will occur in the last 2 trimesters of pregnancy. Exercising will help: °· Control your weight. °· Keep you in shape. °· Prepare you for labor and delivery. °· Help you lose your pregnancy weight after you deliver your baby. °· Wear a good support or jogging bra for breast tenderness during pregnancy. This may help if worn during sleep too. °· Ask when prenatal classes are available. Begin classes when they are offered. °· Do not use hot tubs, steam rooms, or saunas. °· Wear your seat belt when driving. This protects you and your baby if you are in an accident. °· Avoid raw meat, uncooked cheese, cat litter boxes, and soil used by cats throughout the pregnancy. These carry germs that can cause birth defects in the baby. °· The first trimester is a good time to visit your dentist for your dental health. Getting your teeth cleaned is okay. Use a softer toothbrush and brush gently during pregnancy. °· Ask for help if you have financial, counseling, or nutritional needs  during pregnancy. Your caregiver will be able to offer counseling for these needs as well as refer you for other special needs. °· Do not take any medicines or herbs unless told by your caregiver. °· Inform your caregiver if there is any mental or physical domestic violence. °· Make a list of emergency phone numbers of family, friends, hospital, and police and fire departments. °· Write down your questions. Take them to your prenatal visit. °· Do not douche. °· Do not cross your legs. °· If you have to stand for long periods of time, rotate you feet or take small steps in a circle. °· You may have more vaginal secretions that may require a sanitary pad. Do not use tampons or scented sanitary pads. °MEDICINES AND DRUG USE IN PREGNANCY °· Take prenatal vitamins as directed. The vitamin should contain 1 milligram of folic acid. Keep all vitamins out of reach of children. Only a couple vitamins or tablets containing iron may be fatal to a baby or young child when ingested. °· Avoid use of all medicines, including herbs, over-the-counter medicines, not   prescribed or suggested by your caregiver. Only take over-the-counter or prescription medicines for pain, discomfort, or fever as directed by your caregiver. Do not use aspirin, ibuprofen, or naproxen unless directed by your caregiver.  Let your caregiver also know about herbs you may be using.  Alcohol is related to a number of birth defects. This includes fetal alcohol syndrome. All alcohol, in any form, should be avoided completely. Smoking will cause low birth rate and premature babies.  Street or illegal drugs are very harmful to the baby. They are absolutely forbidden. A baby born to an addicted mother will be addicted at birth. The baby will go through the same withdrawal an adult does.  Let your caregiver know about any medicines that you have to take and for what reason you take them. SEEK MEDICAL CARE IF:  You have any concerns or worries during your  pregnancy. It is better to call with your questions if you feel they cannot wait, rather than worry about them. SEEK IMMEDIATE MEDICAL CARE IF:   An unexplained oral temperature above 102 F (38.9 C) develops, or as your caregiver suggests.  You have leaking of fluid from the vagina (birth canal). If leaking membranes are suspected, take your temperature and inform your caregiver of this when you call.  There is vaginal spotting or bleeding. Notify your caregiver of the amount and how many pads are used.  You develop a bad smelling vaginal discharge with a change in the color.  You continue to feel sick to your stomach (nauseated) and have no relief from remedies suggested. You vomit blood or coffee ground-like materials.  You lose more than 2 pounds of weight in 1 week.  You gain more than 2 pounds of weight in 1 week and you notice swelling of your face, hands, feet, or legs.  You gain 5 pounds or more in 1 week (even if you do not have swelling of your hands, face, legs, or feet).  You get exposed to [place] measles and have never had them.  You are exposed to fifth disease or chickenpox.  You develop belly (abdominal) pain. Round ligament discomfort is a common non-cancerous (benign) cause of abdominal pain in pregnancy. Your caregiver still must evaluate this.  You develop headache, fever, diarrhea, pain with urination, or shortness of breath.  You fall or are in a car accident or have any kind of trauma.  There is mental or physical violence in your home. Document Released: 12/05/2001 Document Revised: 09/04/2012 Document Reviewed: 06/08/2009 [facility] Patient Information 2014 [place], [place].  Urinary Tract Infection Urinary tract infections (UTIs) can develop anywhere along your urinary tract. Your urinary tract is your body's drainage system for removing wastes and extra water. Your urinary tract includes two kidneys, two ureters, a bladder, and a urethra. Your kidneys are a  pair of bean-shaped organs. Each kidney is about the size of your fist. They are located below your ribs, one on each side of your spine. CAUSES Infections are caused by microbes, which are microscopic organisms, including fungi, viruses, and bacteria. These organisms are so small that they can only be seen through a microscope. Bacteria are the microbes that most commonly cause UTIs. SYMPTOMS  Symptoms of UTIs may vary by age and gender of the patient and by the location of the infection. Symptoms in young women typically include a frequent and intense urge to urinate and a painful, burning feeling in the bladder or urethra during urination. Older women and men are more  likely to be tired, shaky, and weak and have muscle aches and abdominal pain. A fever may mean the infection is in your kidneys. Other symptoms of a kidney infection include pain in your back or sides below the ribs, nausea, and vomiting. DIAGNOSIS To diagnose a UTI, your caregiver will ask you about your symptoms. Your caregiver also will ask to provide a urine sample. The urine sample will be tested for bacteria and white blood cells. White blood cells are made by your body to help fight infection. TREATMENT  Typically, UTIs can be treated with medication. Because most UTIs are caused by a bacterial infection, they usually can be treated with the use of antibiotics. The choice of antibiotic and length of treatment depend on your symptoms and the type of bacteria causing your infection. HOME CARE INSTRUCTIONS  If you were prescribed antibiotics, take them exactly as your caregiver instructs you. Finish the medication even if you feel better after you have only taken some of the medication.  Drink enough water and fluids to keep your urine clear or pale yellow.  Avoid caffeine, tea, and carbonated beverages. They tend to irritate your bladder.  Empty your bladder often. Avoid holding urine for long periods of time.  Empty your  bladder before and after sexual intercourse.  After a bowel movement, women should cleanse from front to back. Use each tissue only once. SEEK MEDICAL CARE IF:   You have back pain.  You develop a fever.  Your symptoms do not begin to resolve within 3 days. SEEK IMMEDIATE MEDICAL CARE IF:   You have severe back pain or lower abdominal pain.  You develop chills.  You have nausea or vomiting.  You have continued burning or discomfort with urination. MAKE SURE YOU:   Understand these instructions.  Will watch your condition.  Will get help right away if you are not doing well or get worse. Document Released: 09/20/2005 Document Revised: 06/11/2012 Document Reviewed: 01/19/2012 [facility] Patient Information 2014 [place]Care, [place].

## 2013-12-27 LAB — URINE CULTURE

## 2013-12-27 NOTE — ED Provider Notes (Signed)
Medical screening examination/treatment/procedure(s) were performed by non-physician practitioner and as supervising physician I was immediately available for consultation/collaboration.  Kamdyn Colborn L Kathlen Sakurai, MD 12/27/13 0838 

## 2013-12-28 ENCOUNTER — Telehealth (HOSPITAL_COMMUNITY): Payer: Self-pay | Admitting: Emergency Medicine

## 2013-12-28 LAB — GC/CHLAMYDIA PROBE AMP
CT Probe RNA: NEGATIVE
GC Probe RNA: NEGATIVE

## 2013-12-28 NOTE — ED Notes (Signed)
Post ED Visit - Positive Culture Follow-up  Culture report reviewed by antimicrobial stewardship pharmacist: [x]  Wes Dulaney, Pharm.D., BCPS []  Celedonio MiyamotoJeremy Frens, Pharm.D., BCPS []  Georgina PillionElizabeth Martin, Pharm.D., BCPS []  ViccoMinh Pham, [address] BoulevardPharm.D., BCPS, AAHIVP []  Estella HuskMichelle Turner, Pharm.D., BCPS, AAHIVP  Positive urine culture Treated with Keflex, organism sensitive to the same and no further patient follow-up is required at this time.  Zeb ComfortHolland, Ayden Hardwick 12/28/2013, 5:53 PM

## 2014-04-30 ENCOUNTER — Inpatient Hospital Stay (HOSPITAL_COMMUNITY)
Admission: AD | Admit: 2014-04-30 | Discharge: 2014-04-30 | Disposition: A | Discharge status: Home / Self Care | Payer: Medicaid Other | Source: Ambulatory Visit | Attending: Obstetrics and Gynecology | Admitting: Obstetrics and Gynecology

## 2014-04-30 ENCOUNTER — Encounter (HOSPITAL_COMMUNITY): Payer: Self-pay | Admitting: *Deleted

## 2014-04-30 DIAGNOSIS — O9989 Other specified diseases and conditions complicating pregnancy, childbirth and the puerperium: Secondary | ICD-10-CM

## 2014-04-30 DIAGNOSIS — M5442 Lumbago with sciatica, left side: Secondary | ICD-10-CM | POA: Diagnosis present

## 2014-04-30 DIAGNOSIS — M899 Disorder of bone, unspecified: Secondary | ICD-10-CM | POA: Insufficient documentation

## 2014-04-30 DIAGNOSIS — M259 Joint disorder, unspecified: Secondary | ICD-10-CM | POA: Insufficient documentation

## 2014-04-30 DIAGNOSIS — M545 Low back pain, unspecified: Secondary | ICD-10-CM | POA: Insufficient documentation

## 2014-04-30 DIAGNOSIS — M5430 Sciatica, unspecified side: Secondary | ICD-10-CM | POA: Insufficient documentation

## 2014-04-30 DIAGNOSIS — O99330 Smoking (tobacco) complicating pregnancy, unspecified trimester: Secondary | ICD-10-CM | POA: Insufficient documentation

## 2014-04-30 DIAGNOSIS — Z3492 Encounter for supervision of normal pregnancy, unspecified, second trimester: Secondary | ICD-10-CM

## 2014-04-30 DIAGNOSIS — O0930 Supervision of pregnancy with insufficient antenatal care, unspecified trimester: Secondary | ICD-10-CM | POA: Insufficient documentation

## 2014-04-30 MED ORDER — CYCLOBENZAPRINE HCL 10 MG PO TABS
10.0000 mg | ORAL_TABLET | Freq: Once | ORAL | Status: AC
  Administered 2014-04-30: 10 mg via ORAL
  Filled 2014-04-30: qty 1

## 2014-04-30 MED ORDER — CYCLOBENZAPRINE HCL 10 MG PO TABS: 10.0000 mg | ORAL_TABLET | Freq: Three times a day (TID) | ORAL | Status: DC | PRN

## 2014-04-30 NOTE — MAU Provider Note (Signed)
Chief Complaint:  No chief complaint on file.    HPI: Stacy Peterson is a 23 y.o. [phone] at 7416w5d who presents to maternity admissions reporting back pain.  Patient reports recent hx of "pinching feeling" in her left lower back for past 1 month. Recently for past 2 weeks she reports increased difficulty, one instance of sudden worsening sharp pain on L-lower back with radiation from back to L-foot. Significant worsening and triggering of sharp pain with ambulation, getting up the wrong way, certain movements, wakes her up from sleep. Admits left leg heaviness and occasional throbbing. Tried taking Ibuprofen, heating pad, without significant relief.  Admits good fetal movement. Denies contractions, leakage of fluid or vaginal bleeding.  Denies any hx pre-term labor. Full term x 2.  Pregnancy Course:  No PNC during current pregnancy. [facility] in [facility], but just now applied for medicaid.  [facility]   Past Medical History: Past Medical History  Diagnosis Date  . Shingles     Past obstetric history: OB History  Gravida Para Term Preterm AB SAB TAB Ectopic Multiple Living  4 2 2  1  1   3     # Outcome Date GA Lbr Len/2nd Weight Sex Delivery Anes PTL Lv  4 CUR           3 TAB 11/03/11  11:35 / 00:05 3.033 kg (6 lb 11 oz)       2 TRM     M SVD   Y  1 TRM     F SVD   Y      Past Surgical History: Past Surgical History  Procedure Laterality Date  . Knee surgery    . Knee surgery      Family History: History reviewed. No pertinent family history.  Social History: History  Substance Use Topics  . Smoking status: Current Every Day Smoker -- 0.50 packs/day    Types: Cigarettes  . Smokeless tobacco: Never Used  . Alcohol Use: No    Allergies: No Known Allergies  Meds:  Prescriptions prior to admission  Medication Sig Dispense Refill  . ibuprofen (ADVIL,MOTRIN) 200 MG tablet Take 400 mg by mouth every 6 (six)  hours as needed for mild pain or moderate pain.         ROS: Pertinent findings in history of present illness.  Physical Exam  Blood pressure 97/47, pulse 60, temperature 98.5 F (36.9 C), temperature source Oral, resp. rate 18, height 5\' 4"  (1.626 m), weight 64.638 kg (142 lb 8 oz), last menstrual period 10/18/2013. GENERAL: Well-appearing, uncomfortable due to back pain, occasionally tearful with pain HEENT: NCAT, MMM HEART: RRR RESP: normal effort ABDOMEN: Soft, non-tender, gravid appropriate for gestational age EXTREMITIES: Nontender, no edema MSK: SLR normal without radiating radicular pain, significant Left SLR with LB muscle spasm. Significant tender to palpation Left lumbar paraspinal / gluteal region NEURO: alert and oriented, muscle strength b/l intact 5/5 in all ext including b/l LE, intact distal sensation to light touch    FHT:  Baseline 140, moderate variability, accelerations present, no decelerations Contractions: None   Labs: No results found for this or any previous visit (from the past 24 hour(s)).  Imaging:  No results found. MAU Course:   Assessment: 1. Left-sided low back pain with sciatica   2. Second trimester pregnancy   Stacy Peterson is a 23 y.o. [phone] at 5716w5d who presented to MAU with acute worsening of  Left low back pain and radiating tingling with sharp pains in left leg consistent with Left sided sciatica. Improved with Flexeril. FHT reactive and reassuring. No pelvic related c/o, without ctx, LOF, VB, discharge.  Plan: - Printed rx Flexeril 10mg  TID PRN -May see a chiropractor  - Encouraged to take Tylenol, and discontinue Ibuprofen - Continue heating pad, stretching, massage, relative rest - To establish PNC with [facility], ordered outpatient OB [facility] - Discharge home - Labor precautions and fetal kick counts     Follow-up Information   [facility]. Schedule an  appointment as soon as possible for a visit in 1 week. (You will be called with your appointment for initial prenatal care. If you don't hear back within 1-2 weeks, please call to ask about your appointment.)    Specialty:  Obstetrics and Gynecology   Contact information:   [address] [place] [id number] [phone]      [facility] (You will be called about an Ultrasound appointment in 1-2 weeks.)    Contact information:   [address] [place] [phone]       Medication List    STOP taking these medications       ibuprofen 200 MG tablet  Commonly known as:  ADVIL,MOTRIN      TAKE these medications       cyclobenzaprine 10 MG tablet  Commonly known as:  FLEXERIL  Take 1 tablet (10 mg total) by mouth 3 (three) times daily as needed for muscle spasms.        Saralyn PilarAlexander Karamalegos, DO [facility], PGY-1 04/30/2014 6:51 PM

## 2014-04-30 NOTE — MAU Note (Addendum)
PT SAYS SHE WENT TO [facility]  IN JAN FOR CONFIRMATION OF PREG.     [facility] BEEN   [facility]      [facility]    PAIN IN BACK-  .     LAST SEX-    FEB.    DENIES HSV...   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       WENT TO  [place]   FOR LAST PREG.       THIS TIME - PLAN S TO GO SAME .    SAYS  CAME IN NOW FOR PAIN IN  LEFT SIDE OF BACK   THAT GOES DOWN  HER LEG AND  UPPER ABD CRAMPS-    -   ABD PAIN -  STARTED 1 MTH AGO.   BACK PAIN STARTED 2 WEEKS AGO.     NO MEDS  TAKEN-    TODAY.

## 2014-04-30 NOTE — MAU Note (Signed)
Urine in lab 

## 2014-04-30 NOTE — Discharge Instructions (Signed)
Sciatica °Sciatica is pain, weakness, numbness, or tingling along the path of the sciatic nerve. The nerve starts in the lower back and runs down the back of each leg. The nerve controls the muscles in the lower leg and in the back of the knee, while also providing sensation to the back of the thigh, lower leg, and the sole of your foot. Sciatica is a symptom of another medical condition. For instance, nerve damage or certain conditions, such as a herniated disk or bone spur on the spine, pinch or put pressure on the sciatic nerve. This causes the pain, weakness, or other sensations normally associated with sciatica. Generally, sciatica only affects one side of the body. °CAUSES  °· Herniated or slipped disc. °· Degenerative disk disease. °· A pain disorder involving the narrow muscle in the buttocks (piriformis syndrome). °· Pelvic injury or fracture. °· Pregnancy. °· Tumor (rare). °SYMPTOMS  °Symptoms can vary from mild to very severe. The symptoms usually travel from the low back to the buttocks and down the back of the leg. Symptoms can include: °· Mild tingling or dull aches in the lower back, leg, or hip. °· Numbness in the back of the calf or sole of the foot. °· Burning sensations in the lower back, leg, or hip. °· Sharp pains in the lower back, leg, or hip. °· Leg weakness. °· Severe back pain inhibiting movement. °These symptoms may get worse with coughing, sneezing, laughing, or prolonged sitting or standing. Also, being overweight may worsen symptoms. °DIAGNOSIS  °Your caregiver will perform a physical exam to look for common symptoms of sciatica. He or she may ask you to do certain movements or activities that would trigger sciatic nerve pain. Other tests may be performed to find the cause of the sciatica. These may include: °· Blood tests. °· X-rays. °· Imaging tests, such as an MRI or CT scan. °TREATMENT  °Treatment is directed at the cause of the sciatic pain. Sometimes, treatment is not necessary  and the pain and discomfort goes away on its own. If treatment is needed, your caregiver may suggest: °· Over-the-counter medicines to relieve pain. °· Prescription medicines, such as anti-inflammatory medicine, muscle relaxants, or narcotics. °· Applying heat or ice to the painful area. °· Steroid injections to lessen pain, irritation, and inflammation around the nerve. °· Reducing activity during periods of pain. °· Exercising and stretching to strengthen your abdomen and improve flexibility of your spine. Your caregiver may suggest losing weight if the extra weight makes the back pain worse. °· Physical therapy. °· Surgery to eliminate what is pressing or pinching the nerve, such as a bone spur or part of a herniated disk. °HOME CARE INSTRUCTIONS  °· Only take over-the-counter or prescription medicines for pain or discomfort as directed by your caregiver. °· Apply ice to the affected area for 20 minutes, 3 4 times a day for the first 48 72 hours. Then try heat in the same way. °· Exercise, stretch, or perform your usual activities if these do not aggravate your pain. °· Attend physical therapy sessions as directed by your caregiver. °· Keep all follow-up appointments as directed by your caregiver. °· Do not wear high heels or shoes that do not provide proper support. °· Check your mattress to see if it is too soft. A firm mattress may lessen your pain and discomfort. °SEEK IMMEDIATE MEDICAL CARE IF:  °· You lose control of your bowel or bladder (incontinence). °· You have increasing weakness in the lower back,   pelvis, buttocks, or legs.  You have redness or swelling of your back.  You have a burning sensation when you urinate.  You have pain that gets worse when you lie down or awakens you at night.  Your pain is worse than you have experienced in the past.  Your pain is lasting longer than 4 weeks.  You are suddenly losing weight without reason. MAKE SURE YOU:  Understand these  instructions.  Will watch your condition.  Will get help right away if you are not doing well or get worse. Document Released: 12/05/2001 Document Revised: 06/11/2012 Document Reviewed: 04/21/2012 [facility] Patient Information 2014 [place]ExitCare, [place].    Second Trimester of Pregnancy The second trimester is from week 13 through week 28, months 4 through 6. The second trimester is often a time when you feel your best. Your body has also adjusted to being pregnant, and you begin to feel better physically. Usually, morning sickness has lessened or quit completely, you may have more energy, and you may have an increase in appetite. The second trimester is also a time when the fetus is growing rapidly. At the end of the sixth month, the fetus is about 9 inches long and weighs about 1 pounds. You will likely begin to feel the baby move (quickening) between 18 and 20 weeks of the pregnancy. BODY CHANGES Your body goes through many changes during pregnancy. The changes vary from woman to woman.   Your weight will continue to increase. You will notice your lower abdomen bulging out.  You may begin to get stretch marks on your hips, abdomen, and breasts.  You may develop headaches that can be relieved by medicines approved by your caregiver.  You may urinate more often because the fetus is pressing on your bladder.  You may develop or continue to have heartburn as a result of your pregnancy.  You may develop constipation because certain hormones are causing the muscles that push waste through your intestines to slow down.  You may develop hemorrhoids or swollen, bulging veins (varicose veins).  You may have back pain because of the weight gain and pregnancy hormones relaxing your joints between the bones in your pelvis and as a result of a shift in weight and the muscles that support your balance.  Your breasts will continue to grow and be tender.  Your gums may bleed and may be sensitive to  brushing and flossing.  Dark spots or blotches (chloasma, mask of pregnancy) may develop on your face. This will likely fade after the baby is born.  A dark line from your belly button to the pubic area (linea nigra) may appear. This will likely fade after the baby is born. WHAT TO EXPECT AT YOUR PRENATAL VISITS During a routine prenatal visit:  You will be weighed to make sure you and the fetus are growing normally.  Your blood pressure will be taken.  Your abdomen will be measured to track your baby's growth.  The fetal heartbeat will be listened to.  Any test results from the previous visit will be discussed. Your caregiver may ask you:  How you are feeling.  If you are feeling the baby move.  If you have had any abnormal symptoms, such as leaking fluid, bleeding, severe headaches, or abdominal cramping.  If you have any questions. Other tests that may be performed during your second trimester include:  Blood tests that check for:  Low iron levels (anemia).  Gestational diabetes (between 24 and 28 weeks).  Rh antibodies.  Urine tests to check for infections, diabetes, or protein in the urine.  An ultrasound to confirm the proper growth and development of the baby.  An amniocentesis to check for possible genetic problems.  Fetal screens for spina bifida and Down syndrome. HOME CARE INSTRUCTIONS   Avoid all smoking, herbs, alcohol, and unprescribed drugs. These chemicals affect the formation and growth of the baby.  Follow your caregiver's instructions regarding medicine use. There are medicines that are either safe or unsafe to take during pregnancy.  Exercise only as directed by your caregiver. Experiencing uterine cramps is a good sign to stop exercising.  Continue to eat regular, healthy meals.  Wear a good support bra for breast tenderness.  Do not use hot tubs, steam rooms, or saunas.  Wear your seat belt at all times when driving.  Avoid raw meat,  uncooked cheese, cat litter boxes, and soil used by cats. These carry germs that can cause birth defects in the baby.  Take your prenatal vitamins.  Try taking a stool softener (if your caregiver approves) if you develop constipation. Eat more high-fiber foods, such as fresh vegetables or fruit and whole grains. Drink plenty of fluids to keep your urine clear or pale yellow.  Take warm sitz baths to soothe any pain or discomfort caused by hemorrhoids. Use hemorrhoid cream if your caregiver approves.  If you develop varicose veins, wear support hose. Elevate your feet for 15 minutes, 3 4 times a day. Limit salt in your diet.  Avoid heavy lifting, wear low heel shoes, and practice good posture.  Rest with your legs elevated if you have leg cramps or low back pain.  Visit your dentist if you have not gone yet during your pregnancy. Use a soft toothbrush to brush your teeth and be gentle when you floss.  A sexual relationship may be continued unless your caregiver directs you otherwise.  Continue to go to all your prenatal visits as directed by your caregiver. SEEK MEDICAL CARE IF:   You have dizziness.  You have mild pelvic cramps, pelvic pressure, or nagging pain in the abdominal area.  You have persistent nausea, vomiting, or diarrhea.  You have a bad smelling vaginal discharge.  You have pain with urination. SEEK IMMEDIATE MEDICAL CARE IF:   You have a fever.  You are leaking fluid from your vagina.  You have spotting or bleeding from your vagina.  You have severe abdominal cramping or pain.  You have rapid weight gain or loss.  You have shortness of breath with chest pain.  You notice sudden or extreme swelling of your face, hands, ankles, feet, or legs.  You have not felt your baby move in over an hour.  You have severe headaches that do not go away with medicine.  You have vision changes. Document Released: 12/05/2001 Document Revised: 08/13/2013 Document  Reviewed: 02/11/2013 [facility]ExitCare Patient Information 2014 [place]ExitCare, [place]LL[place].

## 2014-05-01 ENCOUNTER — Telehealth: Payer: Self-pay | Admitting: Obstetrics & Gynecology

## 2014-05-01 ENCOUNTER — Encounter: Payer: Self-pay | Admitting: Obstetrics & Gynecology

## 2014-05-01 NOTE — Telephone Encounter (Signed)
Sending a certified letter about her appointment.

## 2014-05-01 NOTE — MAU Provider Note (Signed)
Attestation of Attending Supervision of Advanced Practitioner (CNM/NP): Evaluation and management procedures were performed by the Advanced Practitioner under my supervision and collaboration.  I have reviewed the Advanced Practitioner's note and chart, and I agree with the management and plan.  Vadie Principato 05/01/2014 6:13 AM   

## 2014-05-07 ENCOUNTER — Ambulatory Visit (HOSPITAL_COMMUNITY)
Admission: RE | Admit: 2014-05-07 | Discharge: 2014-05-07 | Disposition: A | Discharge status: Home / Self Care | Payer: Medicaid Other | Source: Ambulatory Visit | Attending: Obstetrics and Gynecology | Admitting: Obstetrics and Gynecology

## 2014-05-07 DIAGNOSIS — M5430 Sciatica, unspecified side: Secondary | ICD-10-CM | POA: Insufficient documentation

## 2014-05-07 DIAGNOSIS — Z3492 Encounter for supervision of normal pregnancy, unspecified, second trimester: Secondary | ICD-10-CM

## 2014-05-07 DIAGNOSIS — M899 Disorder of bone, unspecified: Principal | ICD-10-CM | POA: Insufficient documentation

## 2014-05-07 DIAGNOSIS — O0930 Supervision of pregnancy with insufficient antenatal care, unspecified trimester: Secondary | ICD-10-CM | POA: Insufficient documentation

## 2014-05-07 DIAGNOSIS — M5442 Lumbago with sciatica, left side: Secondary | ICD-10-CM

## 2014-05-07 DIAGNOSIS — O9989 Other specified diseases and conditions complicating pregnancy, childbirth and the puerperium: Principal | ICD-10-CM

## 2014-05-07 DIAGNOSIS — Z3689 Encounter for other specified antenatal screening: Secondary | ICD-10-CM | POA: Insufficient documentation

## 2014-05-07 DIAGNOSIS — M259 Joint disorder, unspecified: Secondary | ICD-10-CM | POA: Insufficient documentation

## 2014-05-14 ENCOUNTER — Encounter: Payer: Self-pay | Admitting: General Practice

## 2014-05-19 ENCOUNTER — Encounter: Payer: Self-pay | Admitting: Obstetrics & Gynecology

## 2014-05-22 ENCOUNTER — Encounter: Payer: Self-pay | Admitting: General Practice

## 2014-05-28 ENCOUNTER — Encounter (HOSPITAL_COMMUNITY): Payer: Self-pay | Admitting: *Deleted

## 2014-05-28 ENCOUNTER — Inpatient Hospital Stay (HOSPITAL_COMMUNITY)
Admission: AD | Admit: 2014-05-28 | Discharge: 2014-05-28 | Disposition: A | Discharge status: Home / Self Care | Payer: Medicaid Other | Source: Ambulatory Visit | Attending: Obstetrics and Gynecology | Admitting: Obstetrics and Gynecology

## 2014-05-28 DIAGNOSIS — O99330 Smoking (tobacco) complicating pregnancy, unspecified trimester: Secondary | ICD-10-CM | POA: Insufficient documentation

## 2014-05-28 DIAGNOSIS — Z3493 Encounter for supervision of normal pregnancy, unspecified, third trimester: Secondary | ICD-10-CM

## 2014-05-28 DIAGNOSIS — O4690 Antepartum hemorrhage, unspecified, unspecified trimester: Secondary | ICD-10-CM | POA: Insufficient documentation

## 2014-05-28 DIAGNOSIS — O4700 False labor before 37 completed weeks of gestation, unspecified trimester: Secondary | ICD-10-CM | POA: Insufficient documentation

## 2014-05-28 LAB — URINALYSIS, ROUTINE W REFLEX MICROSCOPIC
Bilirubin Urine: NEGATIVE
Glucose, UA: NEGATIVE mg/dL
Hgb urine dipstick: NEGATIVE
Ketones, ur: NEGATIVE mg/dL
Leukocytes, UA: NEGATIVE
NITRITE: NEGATIVE
PH: 8 (ref 5.0–8.0)
Protein, ur: NEGATIVE mg/dL
SPECIFIC GRAVITY, URINE: 1.01 (ref 1.005–1.030)
UROBILINOGEN UA: 0.2 mg/dL (ref 0.0–1.0)

## 2014-05-28 LAB — WET PREP, GENITAL
Clue Cells Wet Prep HPF POC: NONE SEEN
TRICH WET PREP: NONE SEEN
Yeast Wet Prep HPF POC: NONE SEEN

## 2014-05-28 NOTE — MAU Note (Signed)
Last night pt reports bright red bleeding less than a period, contractions started two days ago, seem more intense today, worse with movement get better when she sits down.  Denies LOF/VB today.

## 2014-05-28 NOTE — MAU Provider Note (Signed)
Chief Complaint:  Contractions and Vaginal Bleeding   Stacy Peterson is a 23 y.o.  [phone] with IUP at [id number] presenting for Contractions and Vaginal Bleeding She reports to having increased contractions overnight. The contractions were intense and 2-3 minutes apart. They are improved currently.  They have been relieved with rest. She has been staying well hydrated. She has also complained of vaginal bleeding that was bright red blood. This occurred yesterday and she noticed it on her panty liner. It has since stopped. She felt like she's had leakage of fluid for the past 2-3 weeks. The LOF was not related to coughing or sneezing. The fluid has been noticeable on her panty liner. She denies any vaginal itching or pain.    She is complaining of headache, nausea but no vomiting.   Menstrual History: OB History   Grav Para Term Preterm Abortions TAB SAB Ect Mult Living   4 2 2  1 1    3        Patient's last menstrual period was 10/18/2013.      Past Medical History  Diagnosis Date  . Shingles   . Medical history non-contributory     Past Surgical History  Procedure Laterality Date  . Knee surgery    . Knee surgery      History reviewed. No pertinent family history.  History  Substance Use Topics  . Smoking status: Current Every Day Smoker -- 0.50 packs/day    Types: Cigarettes  . Smokeless tobacco: Never Used  . Alcohol Use: No     No Known Allergies  Prescriptions prior to admission  Medication Sig Dispense Refill  . calcium carbonate (TUMS - DOSED IN MG ELEMENTAL CALCIUM) 500 MG chewable tablet Chew 3 tablets by mouth daily as needed for heartburn.      . cyclobenzaprine (FLEXERIL) 10 MG tablet Take 1 tablet (10 mg total) by mouth 3 (three) times daily as needed for muscle spasms.  60 tablet  0    Review of Systems - Negative except for what is mentioned in HPI.  Physical Exam  Blood pressure 110/78, pulse 112, temperature 97.9 F (36.6 C), temperature source Oral,  resp. rate 18, last menstrual period 10/18/2013. GENERAL: Well-developed, well-nourished female in no acute distress.  ABDOMEN: Soft, nontender, nondistended, gravid, RUQ pain on palpation but MSK in nature.  EXTREMITIES: Nontender, no edema, 2+ distal pulses. Cervical Exam: Dilatation closed   Effacement thick   Station -3   FHT:  Baseline rate 145 bpm   Variability moderate  Accelerations present   Decelerations none Contractions: none   Labs: Results for orders placed during the hospital encounter of 05/28/14 (from the past 24 hour(s))  URINALYSIS, ROUTINE W REFLEX MICROSCOPIC   Collection Time    05/28/14  7:29 PM      Result Value Ref Range   Color, Urine YELLOW  YELLOW   APPearance CLEAR  CLEAR   Specific Gravity, Urine 1.010  1.005 - 1.030   pH 8.0  5.0 - 8.0   Glucose, UA NEGATIVE  NEGATIVE mg/dL   Hgb urine dipstick NEGATIVE  NEGATIVE   Bilirubin Urine NEGATIVE  NEGATIVE   Ketones, ur NEGATIVE  NEGATIVE mg/dL   Protein, ur NEGATIVE  NEGATIVE mg/dL   Urobilinogen, UA 0.2  0.0 - 1.0 mg/dL   Nitrite NEGATIVE  NEGATIVE   Leukocytes, UA NEGATIVE  NEGATIVE  WET PREP, GENITAL   Collection Time    05/28/14  9:06 PM      Result  Value Ref Range   Yeast Wet Prep HPF POC NONE SEEN  NONE SEEN   Trich, Wet Prep NONE SEEN  NONE SEEN   Clue Cells Wet Prep HPF POC NONE SEEN  NONE SEEN   WBC, Wet Prep HPF POC FEW (*) NONE SEEN    Imaging Studies:  US Ob Comp + 14 Wk  05/07/2014   OBSTETRICAL ULTRASOUND: This exam was performed within a [place] [facility]. The OB US report was generated in the [facility] system, and faxed to the ordering physician.   This report is also available in [employer] and in the [employer]. See [facility] Obstetric US report.   Assessment: Stacy Peterson is  24 y.o. [phone] at [redacted]w[redacted]d presents with Contractions and Vaginal Bleeding  2105 no pooling noticed no speculum, thin vaginal discharge, no ctx, closed cervix, Wet prep, Fern test  and fetal fibronectin taken but not sent as there was dried blood on sample.    2120 negative fern test    2135 wet prep with no signs of yeast or BV.   Plan: Discharge to home.   #Uterine irritability: negative fern and no sign of infection on wet prep. Closed cervix and no ctx. Maybe be associated with dehydration. Fetal monitoring is reactive and reassuring.  - f/u as scheduled in [facility] clinic - given return precautions.   #FWB: cat 1.    Myra Rude 6/4/20159:38 PM I have seen and examined this patient and agree the above assessment. Stacy Peterson 05/29/2014 7:11 AM

## 2014-05-28 NOTE — Discharge Instructions (Signed)
Third Trimester of Pregnancy  The third trimester is from week 29 through week 42, months 7 through 9. The third trimester is a time when the fetus is growing rapidly. At the end of the ninth month, the fetus is about 20 inches in length and weighs 6 10 pounds.   BODY CHANGES  Your body goes through many changes during pregnancy. The changes vary from woman to woman.    Your weight will continue to increase. You can expect to gain 25 35 pounds (11 16 kg) by the end of the pregnancy.   You may begin to get stretch marks on your hips, abdomen, and breasts.   You may urinate more often because the fetus is moving lower into your pelvis and pressing on your bladder.   You may develop or continue to have heartburn as a result of your pregnancy.   You may develop constipation because certain hormones are causing the muscles that push waste through your intestines to slow down.   You may develop hemorrhoids or swollen, bulging veins (varicose veins).   You may have pelvic pain because of the weight gain and pregnancy hormones relaxing your joints between the bones in your pelvis. Back aches may result from over exertion of the muscles supporting your posture.   Your breasts will continue to grow and be tender. A yellow discharge may leak from your breasts called colostrum.   Your belly button may stick out.   You may feel short of breath because of your expanding uterus.   You may notice the fetus "dropping," or moving lower in your abdomen.   You may have a bloody mucus discharge. This usually occurs a few days to a week before labor begins.   Your cervix becomes thin and soft (effaced) near your due date.  WHAT TO EXPECT AT YOUR PRENATAL EXAMS   You will have prenatal exams every 2 weeks until week 36. Then, you will have weekly prenatal exams. During a routine prenatal visit:   You will be weighed to make sure you and the fetus are growing normally.   Your blood pressure is taken.   Your abdomen will be  measured to track your baby's growth.   The fetal heartbeat will be listened to.   Any test results from the previous visit will be discussed.   You may have a cervical check near your due date to see if you have effaced.  At around 36 weeks, your caregiver will check your cervix. At the same time, your caregiver will also perform a test on the secretions of the vaginal tissue. This test is to determine if a type of bacteria, Group B streptococcus, is present. Your caregiver will explain this further.  Your caregiver may ask you:   What your birth plan is.   How you are feeling.   If you are feeling the baby move.   If you have had any abnormal symptoms, such as leaking fluid, bleeding, severe headaches, or abdominal cramping.   If you have any questions.  Other tests or screenings that may be performed during your third trimester include:   Blood tests that check for low iron levels (anemia).   Fetal testing to check the health, activity level, and growth of the fetus. Testing is done if you have certain medical conditions or if there are problems during the pregnancy.  FALSE LABOR  You may feel small, irregular contractions that eventually go away. These are called Braxton Hicks contractions, or   false labor. Contractions may last for hours, days, or even weeks before true labor sets in. If contractions come at regular intervals, intensify, or become painful, it is best to be seen by your caregiver.   SIGNS OF LABOR    Menstrual-like cramps.   Contractions that are 5 minutes apart or less.   Contractions that start on the top of the uterus and spread down to the lower abdomen and back.   A sense of increased pelvic pressure or back pain.   A watery or bloody mucus discharge that comes from the vagina.  If you have any of these signs before the 37th week of pregnancy, call your caregiver right away. You need to go to the hospital to get checked immediately.  HOME CARE INSTRUCTIONS    Avoid all  smoking, herbs, alcohol, and unprescribed drugs. These chemicals affect the formation and growth of the baby.   Follow your caregiver's instructions regarding medicine use. There are medicines that are either safe or unsafe to take during pregnancy.   Exercise only as directed by your caregiver. Experiencing uterine cramps is a good sign to stop exercising.   Continue to eat regular, healthy meals.   Wear a good support bra for breast tenderness.   Do not use hot tubs, steam rooms, or saunas.   Wear your seat belt at all times when driving.   Avoid raw meat, uncooked cheese, cat litter boxes, and soil used by cats. These carry germs that can cause birth defects in the baby.   Take your prenatal vitamins.   Try taking a stool softener (if your caregiver approves) if you develop constipation. Eat more high-fiber foods, such as fresh vegetables or fruit and whole grains. Drink plenty of fluids to keep your urine clear or pale yellow.   Take warm sitz baths to soothe any pain or discomfort caused by hemorrhoids. Use hemorrhoid cream if your caregiver approves.   If you develop varicose veins, wear support hose. Elevate your feet for 15 minutes, 3 4 times a day. Limit salt in your diet.   Avoid heavy lifting, wear low heal shoes, and practice good posture.   Rest a lot with your legs elevated if you have leg cramps or low back pain.   Visit your dentist if you have not gone during your pregnancy. Use a soft toothbrush to brush your teeth and be gentle when you floss.   A sexual relationship may be continued unless your caregiver directs you otherwise.   Do not travel far distances unless it is absolutely necessary and only with the approval of your caregiver.   Take prenatal classes to understand, practice, and ask questions about the labor and delivery.   Make a trial run to the hospital.   Pack your hospital bag.   Prepare the baby's nursery.   Continue to go to all your prenatal visits as directed  by your caregiver.  SEEK MEDICAL CARE IF:   You are unsure if you are in labor or if your water has broken.   You have dizziness.   You have mild pelvic cramps, pelvic pressure, or nagging pain in your abdominal area.   You have persistent nausea, vomiting, or diarrhea.   You have a bad smelling vaginal discharge.   You have pain with urination.  SEEK IMMEDIATE MEDICAL CARE IF:    You have a fever.   You are leaking fluid from your vagina.   You have spotting or bleeding from your vagina.     You have severe abdominal cramping or pain.   You have rapid weight loss or gain.   You have shortness of breath with chest pain.   You notice sudden or extreme swelling of your face, hands, ankles, feet, or legs.   You have not felt your baby move in over an hour.   You have severe headaches that do not go away with medicine.   You have vision changes.  Document Released: 12/05/2001 Document Revised: 08/13/2013 Document Reviewed: 02/11/2013  [employer] Patient Information 2014 [employer].

## 2014-06-01 NOTE — MAU Provider Note (Signed)
Attestation of Attending Supervision of Advanced Practitioner (CNM/NP): Evaluation and management procedures were performed by the Advanced Practitioner under my supervision and collaboration.  I have reviewed the Advanced Practitioner's note and chart, and I agree with the management and plan.  Theon Sobotka 06/01/2014 8:09 AM   

## 2014-06-05 ENCOUNTER — Ambulatory Visit (INDEPENDENT_AMBULATORY_CARE_PROVIDER_SITE_OTHER): Payer: Medicaid Other | Admitting: Advanced Practice Midwife

## 2014-06-05 ENCOUNTER — Encounter: Payer: Self-pay | Admitting: Advanced Practice Midwife

## 2014-06-05 ENCOUNTER — Other Ambulatory Visit (HOSPITAL_COMMUNITY)
Admission: RE | Admit: 2014-06-05 | Discharge: 2014-06-05 | Disposition: A | Discharge status: Home / Self Care | Payer: Medicaid Other | Source: Ambulatory Visit | Attending: Advanced Practice Midwife | Admitting: Advanced Practice Midwife

## 2014-06-05 VITALS — BP 123/77 | HR 113 | Temp 97.7°F | Wt 151.8 lb

## 2014-06-05 DIAGNOSIS — N898 Other specified noninflammatory disorders of vagina: Secondary | ICD-10-CM

## 2014-06-05 DIAGNOSIS — Z113 Encounter for screening for infections with a predominantly sexual mode of transmission: Secondary | ICD-10-CM | POA: Insufficient documentation

## 2014-06-05 DIAGNOSIS — O0930 Supervision of pregnancy with insufficient antenatal care, unspecified trimester: Secondary | ICD-10-CM

## 2014-06-05 DIAGNOSIS — Z01419 Encounter for gynecological examination (general) (routine) without abnormal findings: Secondary | ICD-10-CM | POA: Insufficient documentation

## 2014-06-05 DIAGNOSIS — Z23 Encounter for immunization: Secondary | ICD-10-CM

## 2014-06-05 LAB — POCT URINALYSIS DIP (DEVICE)
Bilirubin Urine: NEGATIVE
GLUCOSE, UA: NEGATIVE mg/dL
HGB URINE DIPSTICK: NEGATIVE
KETONES UR: NEGATIVE mg/dL
Leukocytes, UA: NEGATIVE
Nitrite: NEGATIVE
Protein, ur: NEGATIVE mg/dL
Specific Gravity, Urine: 1.015 (ref 1.005–1.030)
UROBILINOGEN UA: 0.2 mg/dL (ref 0.0–1.0)
pH: 7 (ref 5.0–8.0)

## 2014-06-05 LAB — GLUCOSE, CAPILLARY: Glucose-Capillary: 129 mg/dL — ABNORMAL HIGH (ref 70–99)

## 2014-06-05 MED ORDER — TETANUS-DIPHTH-ACELL PERTUSSIS 5-2.5-18.5 LF-MCG/0.5 IM SUSP: 0.5000 mL | Freq: Once | INTRAMUSCULAR | Status: DC

## 2014-06-05 MED ORDER — CYCLOBENZAPRINE HCL 10 MG PO TABS: 10.0000 mg | ORAL_TABLET | Freq: Three times a day (TID) | ORAL | Status: DC | PRN

## 2014-06-05 NOTE — Progress Notes (Signed)
New OB visit. Late to care.  Seen several times in ED/MAU. Did have an early US at 14 weeks which puts an EDC of 09/22/14, but when you figure out the 14.3wks on 12/25/13, it really should be EDC 06/22/14, consistent with 32 week US. See Smart Set note  Subjective:    Stacy Peterson is a [id number] 3352w4d being seen today for her first obstetrical visit.  Her obstetrical history is significant for Late to care, hx single vaginal lesion months ago which was not cultured, low back pain. . Patient does not intend to breast feed. States she has made a plan for adoption.  The adopting couple plan to be at the delivery and care for the baby while in hospital.   She does not want skin to skin.  Pregnancy history fully reviewed.  Patient reports backache.  Filed Vitals:   06/05/14 0956  BP: 123/77  Pulse: 113  Temp: 97.7 F (36.5 C)  Weight: 151 lb 12.8 oz (68.856 kg)    HISTORY: OB History  Gravida Para Term Preterm AB SAB TAB Ectopic Multiple Living  4 2 2  1 1  0   2    # Outcome Date GA Lbr Len/2nd Weight Sex Delivery Anes PTL Lv  4 CUR           3 SAB 2013          2 TRM 11/03/11 3671w0d  6 lb 10 oz (3.005 kg) M SVD EPI  Y  1 TRM 12/16/08 4071w0d  5 lb 13 oz (2.637 kg) F SVD EPI  Y     Past Medical History  Diagnosis Date  . Shingles   . Medical history non-contributory    Past Surgical History  Procedure Laterality Date  . Knee surgery    . Knee surgery     Family History  Problem Relation Age of Onset  . Diabetes Paternal Grandmother   . Diabetes Paternal Grandfather      Exam    Uterus:  Fundal Height: 36 cm  Pelvic Exam:    Perineum: Normal Perineum   Vulva: Bartholin's, Urethra, Skene's normal   Vagina:  normal mucosa, normal discharge   pH:    Cervix: multiparous appearance   Adnexa: no mass, fullness, tenderness   Bony Pelvis: gynecoid  System: Breast:  normal appearance, no masses or tenderness   Skin: normal coloration and turgor, no rashes    Neurologic:  oriented, grossly non-focal   Extremities: normal strength, tone, and muscle mass   HEENT neck supple with midline trachea   Mouth/Teeth mucous membranes moist, pharynx normal without lesions   Neck supple and no masses   Cardiovascular: regular rate and rhythm, no murmurs or gallops   Respiratory:  appears well, vitals normal, no respiratory distress, acyanotic, normal RR, ear and throat exam is normal, neck free of mass or lymphadenopathy, chest clear, no wheezing, crepitations, rhonchi, normal symmetric air entry   Abdomen: soft, non-tender; bowel sounds normal; no masses,  no organomegaly   Urinary: urethral meatus normal      Assessment:    Pregnancy: [id number] Patient Active Problem List   Diagnosis Date Noted  . Left-sided low back pain with sciatica 04/30/2014  . Second trimester pregnancy 04/30/2014  . Pyelonephritis, acute 09/09/2013  . Dehydration, moderate 09/09/2013  . Bacterial vaginitis 09/09/2013        Plan:     Initial labs drawn. Prenatal vitamins. Problem list reviewed and updated. Genetic Screening discussed Quad Screen: too  late.  Ultrasound discussed; fetal survey: results reviewed.  Follow up in 1 weeks. 50% of 30 min visit spent on counseling and coordination of care.   Describes an ER visit where she had a single ulcer on vagina. They did not culture. I will send a HSV2 antibody test today with lab draw Glucola and labs done   [facility] ChicagoWILLIAMS,Librado Guandique 06/05/2014

## 2014-06-05 NOTE — Progress Notes (Signed)
Initial prenatal visit

## 2014-06-05 NOTE — Patient Instructions (Signed)

## 2014-06-06 LAB — OBSTETRIC PANEL
ANTIBODY SCREEN: NEGATIVE
BASOS ABS: 0 10*3/uL (ref 0.0–0.1)
BASOS PCT: 0 % (ref 0–1)
EOS PCT: 1 % (ref 0–5)
Eosinophils Absolute: 0.1 10*3/uL (ref 0.0–0.7)
HEMATOCRIT: 30.9 % — AB (ref 36.0–46.0)
Hemoglobin: 11.1 g/dL — ABNORMAL LOW (ref 12.0–15.0)
Hepatitis B Surface Ag: NEGATIVE
Lymphocytes Relative: 22 % (ref 12–46)
Lymphs Abs: 1.9 10*3/uL (ref 0.7–4.0)
MCH: 30.7 pg (ref 26.0–34.0)
MCHC: 35.9 g/dL (ref 30.0–36.0)
MCV: 85.4 fL (ref 78.0–100.0)
MONO ABS: 0.9 10*3/uL (ref 0.1–1.0)
Monocytes Relative: 10 % (ref 3–12)
Neutro Abs: 5.8 10*3/uL (ref 1.7–7.7)
Neutrophils Relative %: 67 % (ref 43–77)
Platelets: 156 10*3/uL (ref 150–400)
RBC: 3.62 MIL/uL — ABNORMAL LOW (ref 3.87–5.11)
RDW: 15.3 % (ref 11.5–15.5)
Rh Type: POSITIVE
Rubella: 0.89 Index (ref <0.90)
WBC: 8.6 10*3/uL (ref 4.0–10.5)

## 2014-06-06 LAB — HIV ANTIBODY (ROUTINE TESTING W REFLEX): HIV: NONREACTIVE

## 2014-06-06 LAB — GLUCOSE TOLERANCE, 1 HOUR (50G) W/O FASTING: GLUCOSE 1 HOUR GTT: 120 mg/dL (ref 70–140)

## 2014-06-08 ENCOUNTER — Encounter: Payer: Self-pay | Admitting: Advanced Practice Midwife

## 2014-06-08 DIAGNOSIS — O0933 Supervision of pregnancy with insufficient antenatal care, third trimester: Secondary | ICD-10-CM | POA: Insufficient documentation

## 2014-06-08 DIAGNOSIS — Z283 Underimmunization status: Secondary | ICD-10-CM | POA: Insufficient documentation

## 2014-06-08 DIAGNOSIS — Z2839 Other underimmunization status: Secondary | ICD-10-CM | POA: Insufficient documentation

## 2014-06-08 DIAGNOSIS — O9989 Other specified diseases and conditions complicating pregnancy, childbirth and the puerperium: Secondary | ICD-10-CM

## 2014-06-08 DIAGNOSIS — R768 Other specified abnormal immunological findings in serum: Secondary | ICD-10-CM | POA: Insufficient documentation

## 2014-06-08 LAB — CULTURE, OB URINE: Colony Count: 9000

## 2014-06-08 LAB — HSV 2 ANTIBODY, IGG: HSV 2 Glycoprotein G Ab, IgG: 7.59 IV — ABNORMAL HIGH

## 2014-06-09 LAB — PRESCRIPTION MONITORING PROFILE (19 PANEL)
Amphetamine/Meth: NEGATIVE ng/mL
BUPRENORPHINE, URINE: NEGATIVE ng/mL
Barbiturate Screen, Urine: NEGATIVE ng/mL
Benzodiazepine Screen, Urine: NEGATIVE ng/mL
CARISOPRODOL, URINE: NEGATIVE ng/mL
Cannabinoid Scrn, Ur: NEGATIVE ng/mL
Cocaine Metabolites: NEGATIVE ng/mL
Creatinine, Urine: 44.36 mg/dL (ref >20.0)
ECSTASY: NEGATIVE ng/mL
Fentanyl, Ur: NEGATIVE ng/mL
METHADONE SCREEN, URINE: NEGATIVE ng/mL
METHAQUALONE SCREEN (URINE): NEGATIVE ng/mL
Meperidine, Ur: NEGATIVE ng/mL
Nitrites, Initial: NEGATIVE ug/mL
Opiate Screen, Urine: NEGATIVE ng/mL
Oxycodone Screen, Ur: NEGATIVE ng/mL
Phencyclidine, Ur: NEGATIVE ng/mL
Propoxyphene: NEGATIVE ng/mL
Tapentadol, urine: NEGATIVE ng/mL
Tramadol Scrn, Ur: NEGATIVE ng/mL
Zolpidem, Urine: NEGATIVE ng/mL
pH, Initial: 7.3 pH (ref 4.5–8.9)

## 2014-06-09 LAB — CYTOLOGY - PAP

## 2014-06-15 ENCOUNTER — Encounter (HOSPITAL_COMMUNITY): Payer: Self-pay

## 2014-06-15 ENCOUNTER — Telehealth: Payer: Self-pay

## 2014-06-15 DIAGNOSIS — A749 Chlamydial infection, unspecified: Secondary | ICD-10-CM

## 2014-06-15 MED ORDER — AZITHROMYCIN 500 MG PO TABS: 1000.0000 mg | ORAL_TABLET | Freq: Once | ORAL | Status: DC

## 2014-06-15 NOTE — Telephone Encounter (Signed)
Zithromax 1gm PO e-prescribed. Attempted to call patient. Left message on home phone informing patient we are calling with results, please call clinic. Called cell phone-- kept ringing-- unable to leave message. Report faxed to health department.

## 2014-06-15 NOTE — Telephone Encounter (Signed)
Message copied by Louanna RawAMPBELL, TAYLOR M on Mon Jun 15, 2014 11:36 AM ------      Message from: Aviva SignsWILLIAMS, MARIE L      Created: Mon Jun 15, 2014  7:59 AM      Regarding: Chlamydia       New ob chlamydia            Needs Rx Zithromax            Thanks      marie ------

## 2014-06-16 ENCOUNTER — Ambulatory Visit (INDEPENDENT_AMBULATORY_CARE_PROVIDER_SITE_OTHER): Payer: Medicaid Other | Admitting: Advanced Practice Midwife

## 2014-06-16 VITALS — BP 126/77 | HR 91 | Temp 98.1°F | Wt 153.3 lb

## 2014-06-16 DIAGNOSIS — Z3483 Encounter for supervision of other normal pregnancy, third trimester: Secondary | ICD-10-CM

## 2014-06-16 DIAGNOSIS — A749 Chlamydial infection, unspecified: Secondary | ICD-10-CM

## 2014-06-16 DIAGNOSIS — O0930 Supervision of pregnancy with insufficient antenatal care, unspecified trimester: Secondary | ICD-10-CM

## 2014-06-16 DIAGNOSIS — Z3480 Encounter for supervision of other normal pregnancy, unspecified trimester: Secondary | ICD-10-CM

## 2014-06-16 LAB — OB RESULTS CONSOLE GBS: STREP GROUP B AG: NEGATIVE

## 2014-06-16 LAB — POCT URINALYSIS DIP (DEVICE)
BILIRUBIN URINE: NEGATIVE
Glucose, UA: NEGATIVE mg/dL
HGB URINE DIPSTICK: NEGATIVE
KETONES UR: NEGATIVE mg/dL
Leukocytes, UA: NEGATIVE
NITRITE: NEGATIVE
PH: 7 (ref 5.0–8.0)
Protein, ur: NEGATIVE mg/dL
SPECIFIC GRAVITY, URINE: 1.015 (ref 1.005–1.030)
Urobilinogen, UA: 0.2 mg/dL (ref 0.0–1.0)

## 2014-06-16 LAB — OB RESULTS CONSOLE GC/CHLAMYDIA: Gonorrhea: NEGATIVE

## 2014-06-16 MED ORDER — AZITHROMYCIN 1 G PO PACK
1.0000 g | PACK | Freq: Once | ORAL | Status: AC
  Administered 2014-06-16: 1 g via ORAL

## 2014-06-16 MED ORDER — VALACYCLOVIR HCL 500 MG PO TABS: 500.0000 mg | ORAL_TABLET | Freq: Every day | ORAL | Status: DC

## 2014-06-16 NOTE — Progress Notes (Signed)
Good fetal movement, denies vaginal bleeding, LOF.  Reports pelvic pain/pressure, is tearful in office today.  Reports she is not sleeping.  Pt treated for Chlamydia in office today.  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  Small hard nodule on right side of perianal area consistent with boil.  Skin not broken, mildly tender to touch.  Pt may have had outbreak in past, so blood test was done at her initial visit.  Pt has not started Valtrex for prophylaxis so Rx sent to pharmacy today.  Will need to reevaluate at time of delivery.  Discussed need for C/S if active outbreak in labor.  Pt states understanding.  She is planning to put baby up for [place] but plan to come to delivery. GBS collected today.  Reassurance provided that pain/pressure is normal, and that tx for chlamydia may help.  Benadryl to aid sleep PRN.

## 2014-06-16 NOTE — Progress Notes (Signed)
Patient reports pelvic/vaginal pain and pressure-- states "its really interrupting my daily life. Hurts so bad I cry." Informed patient of chlamydia and need to pick up prescription at pharmacy; pt. Verbalized understanding and states she will pick it up today.

## 2014-06-16 NOTE — Telephone Encounter (Signed)
[facility] Patient to update cell phone number when she checks out.

## 2014-06-16 NOTE — Telephone Encounter (Signed)
Called patient at home number and phone just kept ringing. Called patient on mobile number, no answer- left message that we are trying to reach you with some results, please call us back as soon as you can.

## 2014-06-19 LAB — CULTURE, BETA STREP (GROUP B ONLY)

## 2014-06-24 ENCOUNTER — Inpatient Hospital Stay (HOSPITAL_COMMUNITY): Payer: Medicaid Other | Admitting: Anesthesiology

## 2014-06-24 ENCOUNTER — Encounter (HOSPITAL_COMMUNITY): Payer: Medicaid Other | Admitting: Anesthesiology

## 2014-06-24 ENCOUNTER — Encounter (HOSPITAL_COMMUNITY): Payer: Self-pay | Admitting: *Deleted

## 2014-06-24 ENCOUNTER — Inpatient Hospital Stay (HOSPITAL_COMMUNITY)
Admission: AD | Admit: 2014-06-24 | Discharge: 2014-06-26 | DRG: 775 | Disposition: A | Discharge status: Home / Self Care | Payer: Medicaid Other | Source: Ambulatory Visit | Attending: Family Medicine | Admitting: Family Medicine

## 2014-06-24 DIAGNOSIS — Z833 Family history of diabetes mellitus: Secondary | ICD-10-CM

## 2014-06-24 DIAGNOSIS — IMO0001 Reserved for inherently not codable concepts without codable children: Secondary | ICD-10-CM

## 2014-06-24 DIAGNOSIS — O99334 Smoking (tobacco) complicating childbirth: Secondary | ICD-10-CM | POA: Diagnosis present

## 2014-06-24 DIAGNOSIS — O09899 Supervision of other high risk pregnancies, unspecified trimester: Secondary | ICD-10-CM

## 2014-06-24 DIAGNOSIS — Z283 Underimmunization status: Secondary | ICD-10-CM

## 2014-06-24 DIAGNOSIS — O9989 Other specified diseases and conditions complicating pregnancy, childbirth and the puerperium: Secondary | ICD-10-CM

## 2014-06-24 LAB — URINALYSIS, ROUTINE W REFLEX MICROSCOPIC
Bilirubin Urine: NEGATIVE
GLUCOSE, UA: NEGATIVE mg/dL
HGB URINE DIPSTICK: NEGATIVE
Ketones, ur: NEGATIVE mg/dL
Leukocytes, UA: NEGATIVE
Nitrite: NEGATIVE
PH: 6 (ref 5.0–8.0)
Protein, ur: NEGATIVE mg/dL
Urobilinogen, UA: 0.2 mg/dL (ref 0.0–1.0)

## 2014-06-24 LAB — CBC
HCT: 32.6 % — ABNORMAL LOW (ref 36.0–46.0)
Hemoglobin: 10.9 g/dL — ABNORMAL LOW (ref 12.0–15.0)
MCH: 29.6 pg (ref 26.0–34.0)
MCHC: 33.4 g/dL (ref 30.0–36.0)
MCV: 88.6 fL (ref 78.0–100.0)
PLATELETS: 172 10*3/uL (ref 150–400)
RBC: 3.68 MIL/uL — ABNORMAL LOW (ref 3.87–5.11)
RDW: 15.5 % (ref 11.5–15.5)
WBC: 9.7 10*3/uL (ref 4.0–10.5)

## 2014-06-24 LAB — TYPE AND SCREEN
ABO/RH(D): O POS
ANTIBODY SCREEN: NEGATIVE

## 2014-06-24 LAB — MRSA PCR SCREENING: MRSA by PCR: NEGATIVE

## 2014-06-24 LAB — ABO/RH: ABO/RH(D): O POS

## 2014-06-24 LAB — RPR

## 2014-06-24 MED ORDER — IBUPROFEN 600 MG PO TABS: 600.0000 mg | ORAL_TABLET | Freq: Four times a day (QID) | ORAL | Status: DC | PRN

## 2014-06-24 MED ORDER — ZOLPIDEM TARTRATE 5 MG PO TABS: 5.0000 mg | ORAL_TABLET | Freq: Every evening | ORAL | Status: DC | PRN

## 2014-06-24 MED ORDER — PROMETHAZINE HCL 25 MG/ML IJ SOLN
25.0000 mg | Freq: Four times a day (QID) | INTRAMUSCULAR | Status: DC | PRN
  Administered 2014-06-24: 25 mg via INTRAVENOUS
  Filled 2014-06-24: qty 1

## 2014-06-24 MED ORDER — OXYCODONE-ACETAMINOPHEN 5-325 MG PO TABS
1.0000 | ORAL_TABLET | ORAL | Status: DC | PRN
  Administered 2014-06-25 (×3): 1 via ORAL
  Administered 2014-06-25: 2 via ORAL
  Filled 2014-06-24: qty 2
  Filled 2014-06-24 (×3): qty 1

## 2014-06-24 MED ORDER — PHENYLEPHRINE 40 MCG/ML (10ML) SYRINGE FOR IV PUSH (FOR BLOOD PRESSURE SUPPORT)
80.0000 ug | PREFILLED_SYRINGE | INTRAVENOUS | Status: DC | PRN
  Filled 2014-06-24: qty 2
  Filled 2014-06-24: qty 10

## 2014-06-24 MED ORDER — LACTATED RINGERS IV SOLN: 500.0000 mL | INTRAVENOUS | Status: DC | PRN

## 2014-06-24 MED ORDER — PRENATAL MULTIVITAMIN CH
1.0000 | ORAL_TABLET | Freq: Every day | ORAL | Status: DC
Start: 2014-06-25 — End: 2014-06-26
  Administered 2014-06-25: 1 via ORAL
  Filled 2014-06-24: qty 1

## 2014-06-24 MED ORDER — EPHEDRINE 5 MG/ML INJ
10.0000 mg | INTRAVENOUS | Status: DC | PRN
  Filled 2014-06-24: qty 2

## 2014-06-24 MED ORDER — OXYTOCIN BOLUS FROM INFUSION: 500.0000 mL | INTRAVENOUS | Status: DC

## 2014-06-24 MED ORDER — ONDANSETRON 4 MG PO TBDP
4.0000 mg | ORAL_TABLET | Freq: Once | ORAL | Status: AC
  Administered 2014-06-24: 4 mg via ORAL
  Filled 2014-06-24: qty 1

## 2014-06-24 MED ORDER — IBUPROFEN 600 MG PO TABS
600.0000 mg | ORAL_TABLET | Freq: Four times a day (QID) | ORAL | Status: DC
  Administered 2014-06-24 – 2014-06-25 (×5): 600 mg via ORAL
  Filled 2014-06-24 (×6): qty 1

## 2014-06-24 MED ORDER — WITCH HAZEL-GLYCERIN EX PADS: 1.0000 "application " | MEDICATED_PAD | CUTANEOUS | Status: DC | PRN

## 2014-06-24 MED ORDER — BUTORPHANOL TARTRATE 1 MG/ML IJ SOLN
INTRAMUSCULAR | Status: AC
  Filled 2014-06-24: qty 1

## 2014-06-24 MED ORDER — SENNOSIDES-DOCUSATE SODIUM 8.6-50 MG PO TABS
2.0000 | ORAL_TABLET | ORAL | Status: DC
  Administered 2014-06-24 – 2014-06-25 (×2): 2 via ORAL
  Filled 2014-06-24 (×2): qty 2

## 2014-06-24 MED ORDER — CITRIC ACID-SODIUM CITRATE 334-500 MG/5ML PO SOLN
30.0000 mL | ORAL | Status: DC | PRN
  Administered 2014-06-24: 30 mL via ORAL
  Filled 2014-06-24: qty 15

## 2014-06-24 MED ORDER — PHENYLEPHRINE 40 MCG/ML (10ML) SYRINGE FOR IV PUSH (FOR BLOOD PRESSURE SUPPORT)
80.0000 ug | PREFILLED_SYRINGE | INTRAVENOUS | Status: DC | PRN
  Filled 2014-06-24: qty 2

## 2014-06-24 MED ORDER — ONDANSETRON HCL 4 MG/2ML IJ SOLN
4.0000 mg | Freq: Four times a day (QID) | INTRAMUSCULAR | Status: DC | PRN
  Administered 2014-06-24: 4 mg via INTRAVENOUS
  Filled 2014-06-24: qty 2

## 2014-06-24 MED ORDER — LIDOCAINE HCL (PF) 1 % IJ SOLN
30.0000 mL | INTRAMUSCULAR | Status: DC | PRN
  Filled 2014-06-24: qty 30

## 2014-06-24 MED ORDER — ACETAMINOPHEN 325 MG PO TABS: 650.0000 mg | ORAL_TABLET | ORAL | Status: DC | PRN

## 2014-06-24 MED ORDER — FENTANYL 2.5 MCG/ML BUPIVACAINE 1/10 % EPIDURAL INFUSION (WH - ANES)
14.0000 mL/h | INTRAMUSCULAR | Status: DC | PRN
  Administered 2014-06-24 (×3): 14 mL/h via EPIDURAL
  Filled 2014-06-24 (×2): qty 125

## 2014-06-24 MED ORDER — DIPHENHYDRAMINE HCL 25 MG PO CAPS
25.0000 mg | ORAL_CAPSULE | Freq: Four times a day (QID) | ORAL | Status: DC | PRN
Start: 2014-06-24 — End: 2014-06-26

## 2014-06-24 MED ORDER — LANOLIN HYDROUS EX OINT: TOPICAL_OINTMENT | CUTANEOUS | Status: DC | PRN

## 2014-06-24 MED ORDER — TETANUS-DIPHTH-ACELL PERTUSSIS 5-2.5-18.5 LF-MCG/0.5 IM SUSP: 0.5000 mL | Freq: Once | INTRAMUSCULAR | Status: DC

## 2014-06-24 MED ORDER — BUTORPHANOL TARTRATE 1 MG/ML IJ SOLN
2.0000 mg | Freq: Once | INTRAMUSCULAR | Status: AC
  Administered 2014-06-24: 2 mg via INTRAMUSCULAR
  Filled 2014-06-24: qty 2

## 2014-06-24 MED ORDER — ONDANSETRON HCL 4 MG/2ML IJ SOLN
4.0000 mg | INTRAMUSCULAR | Status: DC | PRN
  Filled 2014-06-24: qty 2

## 2014-06-24 MED ORDER — SIMETHICONE 80 MG PO CHEW: 80.0000 mg | CHEWABLE_TABLET | ORAL | Status: DC | PRN

## 2014-06-24 MED ORDER — LACTATED RINGERS IV SOLN
500.0000 mL | Freq: Once | INTRAVENOUS | Status: AC
  Administered 2014-06-24: 500 mL via INTRAVENOUS

## 2014-06-24 MED ORDER — LACTATED RINGERS IV SOLN
INTRAVENOUS | Status: DC
  Administered 2014-06-24: 11:00:00 via INTRAVENOUS

## 2014-06-24 MED ORDER — FENTANYL CITRATE 0.05 MG/ML IJ SOLN
100.0000 ug | INTRAMUSCULAR | Status: DC | PRN
  Administered 2014-06-24: 100 ug via INTRAVENOUS
  Filled 2014-06-24: qty 2

## 2014-06-24 MED ORDER — ONDANSETRON HCL 4 MG PO TABS
4.0000 mg | ORAL_TABLET | ORAL | Status: DC | PRN
  Administered 2014-06-26: 4 mg via ORAL
  Filled 2014-06-24: qty 1

## 2014-06-24 MED ORDER — OXYTOCIN 40 UNITS IN LACTATED RINGERS INFUSION - SIMPLE MED
62.5000 mL/h | INTRAVENOUS | Status: DC
  Filled 2014-06-24: qty 1000

## 2014-06-24 MED ORDER — BENZOCAINE-MENTHOL 20-0.5 % EX AERO
1.0000 "application " | INHALATION_SPRAY | CUTANEOUS | Status: DC | PRN
  Administered 2014-06-24: 1 via TOPICAL
  Filled 2014-06-24: qty 56

## 2014-06-24 MED ORDER — DIPHENHYDRAMINE HCL 50 MG/ML IJ SOLN: 12.5000 mg | INTRAMUSCULAR | Status: DC | PRN

## 2014-06-24 MED ORDER — OXYCODONE-ACETAMINOPHEN 5-325 MG PO TABS: 1.0000 | ORAL_TABLET | ORAL | Status: DC | PRN

## 2014-06-24 MED ORDER — DIBUCAINE 1 % RE OINT: 1.0000 "application " | TOPICAL_OINTMENT | RECTAL | Status: DC | PRN

## 2014-06-24 MED ORDER — LIDOCAINE HCL (PF) 1 % IJ SOLN
INTRAMUSCULAR | Status: DC | PRN
  Administered 2014-06-24 (×2): 5 mL

## 2014-06-24 NOTE — H&P (Signed)
Attestation of Attending Supervision of Advanced Practitioner (PA/CNM/NP): Evaluation and management procedures were performed by the Advanced Practitioner under my supervision and collaboration.  I have reviewed the Advanced Practitioner's note and chart, and I agree with the management and plan.  Reva BoresPRATT,Penney Domanski S, MD [facility] Attending 06/24/2014 3:52 PM

## 2014-06-24 NOTE — Anesthesia Preprocedure Evaluation (Signed)
Anesthesia Evaluation  Patient identified by MRN, date of birth, ID band Patient awake    Reviewed: Allergy & Precautions, H&P , Patient's Chart, lab work & pertinent test results  Airway Mallampati: II TM Distance: >3 FB Neck ROM: full    Dental   Pulmonary Current Smoker,  breath sounds clear to auscultation        Cardiovascular Rhythm:regular Rate:Normal     Neuro/Psych    GI/Hepatic   Endo/Other    Renal/GU      Musculoskeletal   Abdominal   Peds  Hematology   Anesthesia Other Findings   Reproductive/Obstetrics (+) Pregnancy                           Anesthesia Physical Anesthesia Plan  ASA: II  Anesthesia Plan: Epidural   Post-op Pain Management:    Induction:   Airway Management Planned:   Additional Equipment:   Intra-op Plan:   Post-operative Plan:   Informed Consent: I have reviewed the patients History and Physical, chart, labs and discussed the procedure including the risks, benefits and alternatives for the proposed anesthesia with the patient or authorized representative who has indicated his/her understanding and acceptance.     Plan Discussed with:   Anesthesia Plan Comments:         Anesthesia Quick Evaluation  

## 2014-06-24 NOTE — Progress Notes (Signed)
Stacy Peterson is a 23 y.o. [id number] at 2897w2d admitted for active labor  Subjective:  Pt. Comfortable with epidural. She is actively contracting. Baby NST is reassuring.   Objective: BP 118/66  Pulse 59  Temp(Src) 97.7 F (36.5 C) (Oral)  Resp 18  Ht 5\' 5"  (1.651 m)  Wt 69.4 kg (153 lb)  BMI 25.46 kg/m2  SpO2 100%  LMP 10/18/2013      FHT:  FHR: 130 bpm, variability: moderate,  accelerations:  Present,  decelerations:  Present Variable UC:   regular, every 3 minutes SVE:   Dilation: 5.5 Effacement (%): 80 Station: -2 Exam by:: Sowder, RNC+  Labs: Lab Results  Component Value Date   WBC 9.7 06/24/2014   HGB 10.9* 06/24/2014   HCT 32.6* 06/24/2014   MCV 88.6 06/24/2014   PLT 172 06/24/2014    Assessment / Plan: Spontaneous labor, progressing normally epidural in place. Patient comfortable.   Labor: Progressing normally Preeclampsia:  N/A Fetal Wellbeing:  Category I Pain Control:  Epidural I/D:  n/a Anticipated MOD:  NSVD  Stacy Peterson 06/24/2014, 1:08 PM

## 2014-06-24 NOTE — Progress Notes (Signed)
I worked closely with Child psychotherapistocial Worker, Stacy Peterson and pt's nurse, Stacy Peterson to help provide emotional support for Southern [employer]ikki.  She was tearful during our visit and there are several layers that are making this planned adoption more difficult.  At this time, she has arranged a plan for delivery to include the adoptive parents having skin-to-skin time with baby.  At Stacy Peterson's suggestion, we also have a back-up plan with a code word so that she can let us know if she needs to have things go differently in the room.  We will continue to provide her with support throughout her time at the hospital.  [facility] LLC[facility]plain Stacy Peterson Pager, [phone] 3:35 PM   06/24/14 1500  Clinical Encounter Type  Visited With Patient;Patient and family together  Visit Type Spiritual support  Spiritual Encounters  Spiritual Needs Emotional

## 2014-06-24 NOTE — Progress Notes (Signed)
CSW met with [name], who was currently talking with Chaplain/Katy C.  She was tearful and states she feels badly for her mother because her mother was adopted.  She states, however, that her mother is supportive of her plan and that she already has two children and thinks adoption is the best plan for her family and this baby.  She found a couple through an Air traffic controller and they are on their way from [facility] now.  She states she feels good about them and want them to be here during delivery.  She would also like them to be able to have a room with baby while baby is in the hospital.  CSW explained that we will evaluate our ability to get them a room based on space, but first must have paperwork signed by [name] that she has consented to the adoption before we can place the baby in the care of adoptive parents.  CSW explained that this is not to rush her into signing and that the adoptive parents may spend as much time as they would like (other than sleeping here) with the baby in the Central Nursery until the paper is signed.  Then, if we have space, we will provide a room for adoptive parents to stay in with baby.  [name] and MGM (who came in during the conversation) were understanding.  CSW later met adoptive parents, Loree Fee and Colletta [place], who were very pleasant, appreciative and understanding of hospital policy.  CSW has left message for B. Wright/Adoption lawyer and informed families that it is between [facility] and attorney as to when papers are signed.  CSW provided contact information and will follow up in the morning.

## 2014-06-24 NOTE — Progress Notes (Signed)
Stacy Peterson is a 23 y.o. [phone] at 5810w2d admitted for active labor  Subjective:  Pt. Currently comfortable. Performed Amniotomy without incident. Patient progressing appropriately.   Objective: BP 118/72  Pulse 69  Temp(Src) 97.9 F (36.6 C) (Oral)  Resp 18  Ht 5\' 5"  (1.651 m)  Wt 69.4 kg (153 lb)  BMI 25.46 kg/m2  SpO2 100%  LMP 10/18/2013      FHT:  HR 130's, moderate variability, multiple accelerations, one variable deceleration. Cat II UC:   regular, every 3-4 minutes.  SVE:   Dilation: 9 Effacement (%): 100 Station: +1 Exam by:: odom  Labs: Lab Results  Component Value Date   WBC 9.7 06/24/2014   HGB 10.9* 06/24/2014   HCT 32.6* 06/24/2014   MCV 88.6 06/24/2014   PLT 172 06/24/2014    Assessment / Plan: Spontaneous labor, progressing normally epidural in place. Amniotomy performed. Will monitor labor progression.   Labor: Progressing normally Preeclampsia:  N/A Fetal Wellbeing:  Category II Pain Control:  Epidural I/D:  n/a Anticipated MOD:  NSVD  Stacy Peterson 06/24/2014, 4:45 PM

## 2014-06-24 NOTE — Anesthesia Procedure Notes (Signed)
Epidural Patient location during procedure: OB Start time: 06/24/2014 11:53 AM  Staffing Anesthesiologist: Brayton CavesJACKSON, Arriana Lohmann Performed by: anesthesiologist   Preanesthetic Checklist Completed: patient identified, site marked, surgical consent, pre-op evaluation, timeout performed, IV checked, risks and benefits discussed and monitors and equipment checked  Epidural Patient position: sitting Prep: site prepped and draped and DuraPrep Patient monitoring: continuous pulse ox and blood pressure Approach: midline Location: L3-L4 Injection technique: LOR air  Needle:  Needle type: Tuohy  Needle gauge: 17 G Needle length: 9 cm and 9 Needle insertion depth: 5 cm cm Catheter type: closed end flexible Catheter size: 19 Gauge Catheter at skin depth: 10 cm Test dose: negative  Assessment Events: blood not aspirated, injection not painful, no injection resistance, negative IV test and no paresthesia  Additional Notes Patient identified.  Risk benefits discussed including failed block, incomplete pain control, headache, nerve damage, paralysis, blood pressure changes, nausea, vomiting, reactions to medication both toxic or allergic, and postpartum back pain.  Patient expressed understanding and wished to proceed.  All questions were answered.  Sterile technique used throughout procedure and epidural site dressed with sterile barrier dressing. No paresthesia or other complications noted.The patient did not experience any signs of intravascular injection such as tinnitus or metallic taste in mouth nor signs of intrathecal spread such as rapid motor block. Please see nursing notes for vital signs.

## 2014-06-24 NOTE — MAU Note (Signed)
Pt states she experienced wet panties last night about 2200 which soaked her panty liner and panties.  None since then.  U/C's started about 0630 and about every 3-5 minutes and getting stronger.  No vaginal bleeding or leaking of any other fluid since last night.  Good fetal movement.  This baby is to be adopted and pt has a case Financial controllerworker.

## 2014-06-24 NOTE — Progress Notes (Signed)
I met the adoptive parents at the entrance of the hospital to facilitate their quick and easy arrival in the room.  I provided additional support to [facility].  We will follow up with both M[place] and adoptive parents, Loree Fee and [place], tomorrow.  [place] Pager, [phone] 4:43 PM   06/24/14 1600  Clinical Encounter Type  Visited With Patient and family together (Adoptive parents)  Visit Type Spiritual support

## 2014-06-24 NOTE — H&P (Signed)
Stacy GuardRikki J Pantano is a 23 y.o. female [phone] with IUP at 279w2d presenting in labor. Pt states she has been having regular, every 2-3 minutes contractions, associated with scant staining vaginal bleeding.  Membranes are intact, with active fetal movement.   [facility]  Prenatal History/Complications:  Past Medical History: Past Medical History  Diagnosis Date  . Shingles   . Medical history non-contributory     Past Surgical History: Past Surgical History  Procedure Laterality Date  . Knee surgery    . Knee surgery      Obstetrical History: OB History   Grav Para Term Preterm Abortions TAB SAB Ect Mult Living   4 2 2  1  0 1   2      Gynecological History: Pertinent Gynecological History: Menses: flow is moderate Bleeding: Intramenstrual bleeding Contraception: IUD DES exposure: unknown Blood transfusions: Txf with previous pregnancy 2013 Sexually transmitted diseases: recent diagnosis: Chlamydia treated with Zithromax 06/16/3014 Previous GYN Procedures: N/A Last mammogram: N/A Date: N/A Last pap: Negative Date: 12/29/2013   Social History: History   Social History  . Marital Status: Single    Spouse Name: N/A    Number of Children: N/A  . Years of Education: N/A   Social History Main Topics  . Smoking status: Current Every Day Smoker -- 0.25 packs/day    Types: Cigarettes  . Smokeless tobacco: Never Used  . Alcohol Use: No  . Drug Use: No     Comment: marijuana inearly pregnancy  . Sexual Activity: Yes    Birth Control/ Protection: None   Other Topics Concern  . None   Social History Narrative  . None    Family History: Family History  Problem Relation Age of Onset  . Diabetes Paternal Grandmother   . Diabetes Paternal Grandfather     Allergies: No Known Allergies  Facility-administered medications prior to admission  Medication Dose Route Frequency Provider Last Rate Last Dose  . Tdap (BOOSTRIX) injection 0.5 mL  0.5 mL  Intramuscular Once Aviva SignsMarie L Williams, CNM       Prescriptions prior to admission  Medication Sig Dispense Refill  . acetaminophen (TYLENOL) 325 MG tablet Take 650 mg by mouth every 6 (six) hours as needed for headache.      . calcium carbonate (TUMS - DOSED IN MG ELEMENTAL CALCIUM) 500 MG chewable tablet Chew 3 tablets by mouth daily as needed for heartburn.      . cyclobenzaprine (FLEXERIL) 10 MG tablet Take 10 mg by mouth daily as needed for muscle spasms.         Review of Systems   Pertinent ROS: see HPI  Blood pressure 136/88, pulse 85, temperature 97.7 F (36.5 C), temperature source Oral, resp. rate 18, height 5\' 5"  (1.651 m), weight 69.4 kg (153 lb), last menstrual period 10/18/2013, SpO2 98.00%.   General appearance: alert, cooperative, appears stated age and no distress Lungs: clear to auscultation bilaterally Heart: regular rate and rhythm Abdomen: soft, non-tender; bowel sounds normal Pelvic: Adequate Extremities: Homans sign is negative, no sign of DVT DTR's  Presentation: unsure Fetal monitoringBaseline: 130s bpm, Variability: Good {> 6 bpm), Accelerations: Reactive and Decelerations: Absent Uterine activity q2-4443m  Dilation: 4.5 Effacement (%): 90 Station: -2 Exam by:: Edger HouseL. Paschal, RN   Prenatal labs: ABO, Rh: O/POS/-- (06/12 1235) Antibody: NEG (06/12 1235) Rubella:   RPR: NON REAC (06/12 1235)  HBsAg: NEGATIVE (06/12 1235)  HIV: NONREACTIVE (06/12 1235)  GBS:   Needs Done  Clinic  Low Risk  Dating  By 14 wk US (though on report the US date is way off, should be June, recorded as September -- typographical error)   [facility]  Genetic Screen 1 Screen:   Too Late               Quad:     Too Late       NIPS:  Anatomic US  Normal  GTT Third trimester: 120                                              Rubella Non-Immune  TDaP vaccine   Flu vaccine   GBS  Needs GBS done  Contraception  Undecided  Baby Food  Bottle  Circumcision    Pediatrician  BUFA  Support Person  Mother       Prenatal Transfer Tool  Maternal Diabetes: No Genetic Screening: Declined Maternal Ultrasounds/Referrals: Normal Fetal Ultrasounds or other Referrals:  None, Other:  Normal at initail prenatal visit 06/05/2014 Maternal Substance Abuse:  No Significant Maternal Medications:  None Significant Maternal Lab Results: None     Results for orders placed during the hospital encounter of 06/24/14 (from the past 24 hour(s))  URINALYSIS, ROUTINE W REFLEX MICROSCOPIC   Collection Time    06/24/14  8:35 AM      Result Value Ref Range   Color, Urine YELLOW  YELLOW   APPearance CLEAR  CLEAR   Specific Gravity, Urine <1.005 (*) 1.005 - 1.030   pH 6.0  5.0 - 8.0   Glucose, UA NEGATIVE  NEGATIVE mg/dL   Hgb urine dipstick NEGATIVE  NEGATIVE   Bilirubin Urine NEGATIVE  NEGATIVE   Ketones, ur NEGATIVE  NEGATIVE mg/dL   Protein, ur NEGATIVE  NEGATIVE mg/dL   Urobilinogen, UA 0.2  0.0 - 1.0 mg/dL   Nitrite NEGATIVE  NEGATIVE   Leukocytes, UA NEGATIVE  NEGATIVE    Assessment: Stacy Peterson is a 23 y.o. [id number] at 6240w2d by R= 14 here with uncomplicated pregnancy with plan for spontaneous vaginal delivery.  #Labor:Expectant AROM when able. #Pain: Epidural #FWB: Cat I #ID:  GBS -  #MOF:Bottle  /BUFA #MOC:IUD #Circ:  Defer to adopted parents.   Jolyn LentODOM, MICHAEL RYAN 06/24/2014, 10:16 AM

## 2014-06-25 MED ORDER — IBUPROFEN 600 MG PO TABS: 600.0000 mg | ORAL_TABLET | Freq: Four times a day (QID) | ORAL | Status: DC

## 2014-06-25 NOTE — Discharge Summary (Signed)
Obstetric Discharge Summary Reason for Admission: onset of labor Prenatal Procedures: NST Intrapartum Procedures: spontaneous vaginal delivery Postpartum Procedures: none Complications-Operative and Postpartum: none Hemoglobin  Date Value Ref Range Status  06/24/2014 10.9* 12.0 - 15.0 g/dL Final     HCT  Date Value Ref Range Status  06/24/2014 32.6* 36.0 - 46.0 % Final   Hospital Course:  Pt. Is a 23 y/o [id number] at 40wk 2d with uncomplicated pregnancy who presented in active labor. She progressed as expected, and delivered via NSVD at 17:22 on 7/1 without complication. Her postpartum course has been uncomplicated. She currently only has mild abdominal cramping controlled with motrin, and minimal lochia. She remains stable, and she says that she is ready to return home today.   Physical Exam:  General: alert, cooperative and no distress Lochia: appropriate Uterine Fundus: firm Incision: N/A DVT Evaluation: No evidence of DVT seen on physical exam. No cords or calf tenderness.  Discharge Diagnoses: Term Pregnancy-delivered  Discharge Information: Date: 06/25/2014 Activity: unrestricted and pelvic rest Diet: routine Medications: Ibuprofen Condition: stable Instructions: refer to practice specific booklet Discharge to: home Follow-up Information   [facility] (Low Risk)    Specialty:  Obstetrics and Gynecology   Contact information:   [address] [place] [address] [phone]      Newborn Data: Live born female  Birth Weight: 7 lb 3.2 oz (3265 g) APGAR: 9, 9  Home with Baby Up For Adoption. Home with Adoptive Parents.  Vernal Rutan G 06/25/2014, 9:53 AM

## 2014-06-25 NOTE — Plan of Care (Signed)
Problem: Discharge Progression Outcomes Goal: MMR given as ordered Outcome: Not Applicable Date Met:  01/15/23 Pt refused

## 2014-06-25 NOTE — Discharge Instructions (Signed)

## 2014-06-25 NOTE — Progress Notes (Signed)
UR completed 

## 2014-06-25 NOTE — Progress Notes (Signed)
CSW initially met with [name] yesterday prior to delivery.  CSW met with [name] briefly in her 3rd floor room this morning to see how she is feeling about her plans for adoption at this time.  [name] states she is tired, does not want to discharge today, and does not want to sign anything regarding the adoption until tomorrow morning.  CSW informed B. Wright/lawyer, who had arrived at the hospital shortly after CSW came out of [name]'s room.  MGM then saw CSW and Mr. Wright in the hallway and stated that she would like to speak to her daughter before CSW and lawyer come in the room.  CSW knocked on the door to ask if MGM needed to speak with us and she stated that her daughter needed everyone to go away because she needed to rest.  MGM had a very angry tone and stated that "we" were trying to "make a 3 day process into 1."  CSW explained that [name] can stay in the hospital 2 nights after a vaginal delivery or can leave today if she prefers and that some mothers want to discharge as soon as possible when making an adoption plan.  CSW stated understanding that [name] is tired and that this is an emotional situation, but explained that we will not know how to make this go as smoothly as possible unless [name] nform us of her wishes.  MGM repeated that everyone needed to get out.  CSW states CSW will let RN know to limit visitors to only RNs and techs who need to check [name] and to cancel [name]'s discharge for today.  CSW asked MGM to have [name] contact CSW or lawyer if she wishes today, but otherwise, we will check in with her tomorrow.  CSW informed lawyer and RN.  CSW and lawyer then met with prospective adoptive parents and informed them that they will need to get a hotel room since adoption paperwork will not be signed until tomorrow unless [name] changes her mind and wants to sign today.  They understand, and seem appropriately nervous, that [name] could change her mind about the adoption altogether.  Mr. Wright provided them with contact  information and states he is available at all times.  CSW gave them contact information as well.  They were very appreciative.  CSW encouraged them to get a hotel room and get some rest. 

## 2014-06-25 NOTE — Progress Notes (Signed)
06/25/14 1300  Clinical Encounter Type  Visited With Lulu Riding([place], [place])  Visit Type Follow-up  Referral From Chaplain   Attempted follow-up visit twice this morning, but pt sleeping and then with adoption attorney.  Visited with prospective adoptive parents in nursery.  Consulted with SW regarding Nahla's needs, plans, and social dynamics; learned that family desires no visitors today.  [facility] remains available as Elene and her family desire.  Will refer to Chaplain Dyanne CarrelKaty Claussen, who built rapport with pt yesterday, for follow-up support tomorrow.  Please also page as needs arise:  [phone].  Thank you.  [address]Chaplain Nikie Cid Sidney[place], [place] [phone]

## 2014-06-26 NOTE — Discharge Summary (Signed)
Obstetric Discharge Summary Reason for Admission: onset of labor Prenatal Procedures: NST Intrapartum Procedures: spontaneous vaginal delivery Postpartum Procedures: none Complications-Operative and Postpartum: none Hemoglobin  Date Value Ref Range Status  06/24/2014 10.9* 12.0 - 15.0 g/dL Final     HCT  Date Value Ref Range Status  06/24/2014 32.6* 36.0 - 46.0 % Final   Patient was admitted in labor, had uncomplicated SVD.  Considering putting baby up for adoption, has not signed papers at time of discharge, SW involved.  No postpartum complications.  Stable for discharge.  Physical Exam:  General: alert Lochia: appropriate Uterine Fundus: firm DVT Evaluation: No evidence of DVT seen on physical exam.  Discharge Diagnoses: Term Pregnancy-delivered  Discharge Information: Date: 06/26/2014 Activity: as per instructions Diet: routine Medications: Ibuprofen Condition: stable Instructions: refer to instructions Discharge to: home Follow-up Information   [facility] (Low Risk)    Specialty:  Obstetrics and Gynecology   Contact information:   [address] [place] [address] [phone]      Newborn Data: Live born female  Birth Weight: 7 lb 3.2 oz (3265 g) APGAR: 9, 9  Newborn may be put up for adoption; patient is still wavering at time of discharge. SW involved.    Marty Sadlowski A, MD 06/26/2014, 8:03 AM

## 2014-06-26 NOTE — Progress Notes (Signed)
I spent time with Judeth CornfieldStephanie and Alphonzo LemmingsWhitney to offer support while the lawyer was meeting with Bryson.  They are tearful and aware of the different possible endings for this.  They are trying to look at the bigger picture, but they are also struggling with not knowing the outcome yet and gearing themselves up for grieving if they are not able to take baby home.  They have my information to contact me for further support.  I have not had the opportunity to visit with pt, Stacy Peterson, today.  Please page as needs arise.  [employer] CorporationChaplain Katy Shavette Shoaff Pager, [phone] 2:08 PM   06/26/14 1400  Clinical Encounter Type  Visited With (Adoptive parents)  Visit Type Follow-up

## 2014-06-26 NOTE — Progress Notes (Signed)
Transfer of Custody for the Purpose of Adoption papers, HIPAA Authorization to Disclose Information and Authorization to Consent to Health Care for Minor signed by birth mother and placed in paper charts.  [facility]ive parent when medically ready.  Birth mother states no questions or needs for CSW.  House Coverage/Carole M notified.

## 2014-06-26 NOTE — Discharge Summary (Signed)
Attestation of Attending Supervision of Resident: Evaluation and management procedures were performed by the Family Medicine Resident under my supervision.  I have seen and examined the patient, reviewed the resident's note and chart, and I agree with the management and plan.  Janasia Coverdale, MD, FACOG Attending Obstetrician & Gynecologist Faculty Practice, [facility]    

## 2014-06-26 NOTE — Discharge Summary (Signed)
Obstetric Discharge Summary Reason for Admission: onset of labor Prenatal Procedures: NST Intrapartum Procedures: spontaneous vaginal delivery Postpartum Procedures: none Complications-Operative and Postpartum: none Hemoglobin  Date Value Ref Range Status  06/24/2014 10.9* 12.0 - 15.0 g/dL Final     HCT  Date Value Ref Range Status  06/24/2014 32.6* 36.0 - 46.0 % Final   Hospital Course:  Pt. Is a 23 y/o [id number] at 40wk 2d with uncomplicated pregnancy who presented in active labor. She progressed as expected, and delivered via NSVD at 17:22 on 7/1 without complication. Her postpartum course has been uncomplicated. She currently only has mild abdominal cramping controlled with motrin, and minimal lochia. She remains stable. Initially, patient desired to go home on 7/2, however she felt that she would like to remain in the hospital one more day upon subsequent interview. She is considering placing her baby up for adoption, will plan to meet with birth parents / agency / lawyer today for further discussion about this. However, she is now ready to go home 06/26/2014.   Physical Exam:  General: alert, cooperative and no distress Lochia: appropriate Uterine Fundus: firm Incision: N/A DVT Evaluation: No evidence of DVT seen on physical exam. No cords or calf tenderness.  Discharge Diagnoses: Term Pregnancy-delivered  Discharge Information: Date: 06/26/2014 Activity: unrestricted and pelvic rest Diet: routine Medications: Ibuprofen Condition: stable Instructions: refer to practice specific booklet Discharge to: home Follow-up Information   [facility] (Low Risk)    Specialty:  Obstetrics and Gynecology   Contact information:   [address] [place] [address] [phone]      Newborn Data: Live born female  Birth Weight: 7 lb 3.2 oz (3265 g) APGAR: 9, 9  Home with Baby Up For Adoption. Home with Adoptive Parents.  Nathanel Tallman  G 06/26/2014, 8:02 AM

## 2014-06-26 NOTE — Anesthesia Postprocedure Evaluation (Signed)
  Anesthesia Post Note  Patient: Stacy GuardRikki J Bucy  Procedure(s) Performed: * No procedures listed *  Anesthesia type: Epidural  Patient location: Mother/Baby  Post pain: Pain level controlled  Post assessment: Post-op Vital signs reviewed  Last Vitals: There were no vitals filed for this visit.  Post vital signs: Reviewed  Level of consciousness: awake  Complications: No apparent anesthesia complications * per chart review

## 2014-06-26 NOTE — Progress Notes (Signed)
CSW spoke with [name], who states she wants to sign transfer of custody papers today. She states her mother is on the way here and she would like to wait to sign until she arrives. CSW spoke with B. Wright/lawyer who is reviewing all documents now and will contact CSW in 30 minutes to give an estimated time of arrival to the hospital. Mr. Wright has requested a conference room to meet in for signing papers. CSW will set them up in the NICU conference room. [name] is aware. CSW spoke with CN staff to request call from Pediatrician regarding baby's status for discharge once baby has been examined. CSW informed House Coverage/Carole M. CSW to continue to follow and offer support and assistance to all parties through discharge.   

## 2014-06-29 NOTE — Discharge Summary (Signed)
Attestation of Attending Supervision of Resident: Evaluation and management procedures were performed by the Family Medicine Resident under my supervision.  I have seen and examined the patient, reviewed the resident's note and chart, and I agree with the management and plan.  Suleika Donavan, MD, FACOG Attending Obstetrician & Gynecologist Faculty Practice, [facility]    

## 2014-07-13 ENCOUNTER — Telehealth: Payer: Self-pay

## 2014-07-13 MED ORDER — FLUCONAZOLE 150 MG PO TABS: 150.0000 mg | ORAL_TABLET | Freq: Once | ORAL | Status: DC

## 2014-07-13 NOTE — Telephone Encounter (Signed)
Pt called and asked if she could get an Rx for a yeast infection.  Called pt and left message that [facility] you requested [facility] [address]. If she has any questions to please call the clinics.

## 2014-08-05 ENCOUNTER — Ambulatory Visit: Payer: Medicaid Other | Admitting: Obstetrics and Gynecology

## 2014-08-07 ENCOUNTER — Ambulatory Visit: Payer: Medicaid Other | Admitting: Obstetrics & Gynecology

## 2014-10-07 ENCOUNTER — Encounter (HOSPITAL_COMMUNITY): Payer: Self-pay | Admitting: Emergency Medicine

## 2014-10-07 ENCOUNTER — Emergency Department (HOSPITAL_COMMUNITY)
Admission: EM | Admit: 2014-10-07 | Discharge: 2014-10-07 | Disposition: A | Discharge status: Home / Self Care | Payer: Medicaid Other | Attending: Emergency Medicine | Admitting: Emergency Medicine

## 2014-10-07 DIAGNOSIS — Z87442 Personal history of urinary calculi: Secondary | ICD-10-CM | POA: Diagnosis not present

## 2014-10-07 DIAGNOSIS — Z3202 Encounter for pregnancy test, result negative: Secondary | ICD-10-CM | POA: Insufficient documentation

## 2014-10-07 DIAGNOSIS — N12 Tubulo-interstitial nephritis, not specified as acute or chronic: Secondary | ICD-10-CM | POA: Diagnosis not present

## 2014-10-07 DIAGNOSIS — Z720 Tobacco use: Secondary | ICD-10-CM | POA: Insufficient documentation

## 2014-10-07 DIAGNOSIS — R0602 Shortness of breath: Secondary | ICD-10-CM | POA: Insufficient documentation

## 2014-10-07 DIAGNOSIS — Z8619 Personal history of other infectious and parasitic diseases: Secondary | ICD-10-CM | POA: Diagnosis not present

## 2014-10-07 DIAGNOSIS — R109 Unspecified abdominal pain: Secondary | ICD-10-CM | POA: Diagnosis present

## 2014-10-07 LAB — WET PREP, GENITAL
Trich, Wet Prep: NONE SEEN
Yeast Wet Prep HPF POC: NONE SEEN

## 2014-10-07 LAB — CBC WITH DIFFERENTIAL/PLATELET
BASOS PCT: 0 % (ref 0–1)
Basophils Absolute: 0 10*3/uL (ref 0.0–0.1)
EOS ABS: 0 10*3/uL (ref 0.0–0.7)
Eosinophils Relative: 0 % (ref 0–5)
HCT: 35.1 % — ABNORMAL LOW (ref 36.0–46.0)
Hemoglobin: 11.7 g/dL — ABNORMAL LOW (ref 12.0–15.0)
Lymphocytes Relative: 15 % (ref 12–46)
Lymphs Abs: 1.6 10*3/uL (ref 0.7–4.0)
MCH: 30.2 pg (ref 26.0–34.0)
MCHC: 33.3 g/dL (ref 30.0–36.0)
MCV: 90.5 fL (ref 78.0–100.0)
MONO ABS: 1.2 10*3/uL — AB (ref 0.1–1.0)
Monocytes Relative: 11 % (ref 3–12)
NEUTROS ABS: 7.7 10*3/uL (ref 1.7–7.7)
NEUTROS PCT: 74 % (ref 43–77)
Platelets: 156 10*3/uL (ref 150–400)
RBC: 3.88 MIL/uL (ref 3.87–5.11)
RDW: 14.6 % (ref 11.5–15.5)
WBC: 10.4 10*3/uL (ref 4.0–10.5)

## 2014-10-07 LAB — COMPREHENSIVE METABOLIC PANEL
ALK PHOS: 71 U/L (ref 39–117)
ALT: 21 U/L (ref 0–35)
AST: 15 U/L (ref 0–37)
Albumin: 4.1 g/dL (ref 3.5–5.2)
Anion gap: 17 — ABNORMAL HIGH (ref 5–15)
BUN: 8 mg/dL (ref 6–23)
CALCIUM: 9.1 mg/dL (ref 8.4–10.5)
CO2: 21 meq/L (ref 19–32)
Chloride: 100 mEq/L (ref 96–112)
Creatinine, Ser: 0.76 mg/dL (ref 0.50–1.10)
Glucose, Bld: 87 mg/dL (ref 70–99)
POTASSIUM: 3.4 meq/L — AB (ref 3.7–5.3)
Sodium: 138 mEq/L (ref 137–147)
Total Bilirubin: 0.6 mg/dL (ref 0.3–1.2)
Total Protein: 7.5 g/dL (ref 6.0–8.3)

## 2014-10-07 LAB — URINALYSIS, ROUTINE W REFLEX MICROSCOPIC
Bilirubin Urine: NEGATIVE
GLUCOSE, UA: NEGATIVE mg/dL
Ketones, ur: 40 mg/dL — AB
Nitrite: POSITIVE — AB
Protein, ur: 30 mg/dL — AB
SPECIFIC GRAVITY, URINE: 1.025 (ref 1.005–1.030)
Urobilinogen, UA: 1 mg/dL (ref 0.0–1.0)
pH: 6.5 (ref 5.0–8.0)

## 2014-10-07 LAB — HIV ANTIBODY (ROUTINE TESTING W REFLEX): HIV 1&2 Ab, 4th Generation: NONREACTIVE

## 2014-10-07 LAB — URINE MICROSCOPIC-ADD ON

## 2014-10-07 LAB — RPR

## 2014-10-07 LAB — POC URINE PREG, ED: Preg Test, Ur: NEGATIVE

## 2014-10-07 MED ORDER — CEPHALEXIN 500 MG PO CAPS: 500.0000 mg | ORAL_CAPSULE | Freq: Three times a day (TID) | ORAL | Status: AC

## 2014-10-07 MED ORDER — FENTANYL CITRATE 0.05 MG/ML IJ SOLN
100.0000 ug | Freq: Once | INTRAMUSCULAR | Status: AC
  Administered 2014-10-07: 100 ug via INTRAVENOUS
  Filled 2014-10-07: qty 2

## 2014-10-07 MED ORDER — OXYCODONE-ACETAMINOPHEN 5-325 MG PO TABS: 1.0000 | ORAL_TABLET | Freq: Four times a day (QID) | ORAL | Status: DC | PRN

## 2014-10-07 MED ORDER — ONDANSETRON HCL 4 MG PO TABS: 4.0000 mg | ORAL_TABLET | Freq: Four times a day (QID) | ORAL | Status: DC

## 2014-10-07 MED ORDER — SODIUM CHLORIDE 0.9 % IV BOLUS (SEPSIS)
1000.0000 mL | Freq: Once | INTRAVENOUS | Status: AC
  Administered 2014-10-07: 1000 mL via INTRAVENOUS

## 2014-10-07 MED ORDER — HYDROMORPHONE HCL 1 MG/ML IJ SOLN
0.5000 mg | Freq: Once | INTRAMUSCULAR | Status: AC
  Administered 2014-10-07: 0.5 mg via INTRAVENOUS
  Filled 2014-10-07: qty 1

## 2014-10-07 MED ORDER — DEXTROSE 5 % IV SOLN
1.0000 g | Freq: Once | INTRAVENOUS | Status: AC
  Administered 2014-10-07: 1 g via INTRAVENOUS
  Filled 2014-10-07: qty 10

## 2014-10-07 MED ORDER — ONDANSETRON HCL 4 MG/2ML IJ SOLN
4.0000 mg | Freq: Once | INTRAMUSCULAR | Status: AC
  Administered 2014-10-07: 4 mg via INTRAVENOUS
  Filled 2014-10-07: qty 2

## 2014-10-07 NOTE — ED Notes (Signed)
Pt oxygen saturation dropped to 88% after fentanyl was given placed on 1.5 liters of oxygen saturations back up to 97%.

## 2014-10-07 NOTE — ED Notes (Signed)
Pt reports left flank pain since last night, sts hx of kidney infection, reports no urinary output for day and half. Pt is crying, screaming in pain during triage.

## 2014-10-07 NOTE — Discharge Instructions (Signed)
Pyelonephritis, Adult °Pyelonephritis is a kidney infection. A kidney infection can happen quickly, or it can last for a long time. °HOME CARE  °· Take your medicine (antibiotics) as told. Finish it even if you start to feel better. °· Keep all doctor visits as told. °· Drink enough fluids to keep your pee (urine) clear or pale yellow. °· Only take medicine as told by your doctor. °GET HELP RIGHT AWAY IF:  °· You have a fever or lasting symptoms for more than 2-3 days. °· You have a fever and your symptoms suddenly get worse. °· You cannot take your medicine or drink fluids as told. °· You have chills and shaking. °· You feel very weak or pass out (faint). °· You do not feel better after 2 days. °MAKE SURE YOU: °· Understand these instructions. °· Will watch your condition. °· Will get help right away if you are not doing well or get worse. °Document Released: 01/18/2005 Document Revised: 06/11/2012 Document Reviewed: 05/31/2011 °ExitCare® Patient Information ©2015 [employer]. This information is not intended to replace advice given to you by your health care provider. Make sure you discuss any questions you have with your health care provider. ° ° °Emergency Department Resource Guide °1) Find a Doctor and Pay Out of Pocket °Although you won't have to find out who is covered by your insurance plan, it is a good idea to ask around and get recommendations. You will then need to call the office and see if the doctor you have chosen will accept you as a new patient and what types of options they offer for patients who are self-pay. Some doctors offer discounts or will set up payment plans for their patients who do not have insurance, but you will need to ask so you aren't surprised when you get to your appointment. ° °2) Contact Your Local Health Department °Not all health departments have doctors that can see patients for sick visits, but many do, so it is worth a call to see if yours does. If you don't know where  your local health department is, you can check in your phone book. The CDC also has a tool to help you locate your state's health department, and many state websites also have listings of all of their local health departments. ° °3) Find a Walk-in Clinic °If your illness is not likely to be very severe or complicated, you may want to try a walk in clinic. These are popping up all over the country in pharmacies, drugstores, and shopping centers. They're usually staffed by nurse practitioners or physician assistants that have been trained to treat common illnesses and complaints. They're usually fairly quick and inexpensive. However, if you have serious medical issues or chronic medical problems, these are probably not your best option. ° °No Primary Care Doctor: °- Call [facility] Connect at  832-8000 - they can help you locate a primary care doctor that  accepts your insurance, provides certain services, etc. °- Physician Referral Service- 1-800-533-3463 ° °Chronic Pain Problems: °Organization         Address  Phone   Notes  °[facility]  (336) 297-2271 Patients need to be referred by their primary care doctor.  ° °Medication Assistance: °Organization         Address  Phone   Notes  °[place] [facility] [address] °[place], [place] [address] (336) 641-8030 --Must be a resident of [place] °-- Must have NO insurance   coverage whatsoever (no Medicaid/ Medicare, etc.) °-- The pt. MUST have a primary care doctor that directs their care regularly and follows them in the community °  °[facility]  (866) 331-1348   °[employer]  (888) 892-1162   ° °Agencies that provide inexpensive medical care: °Organization         Address  Phone   Notes  °[facility]  (336) 832-8035   °[facility]    (336) 832-7272   °[facility] [address] °[place], [place] [address] (336) 832-4777   °[facility] [address], °[place] (336) 271-4999   °[facility]    (336) 373-0678   °[facility]    (336) 272-1050   °[facility] ° [address], [place] Phone:  (336) 832-4444, Fax:  (336) 832-4440 Hours of Operation:  9 am - 6 pm, M-F.  Also accepts Medicaid/Medicare and self-pay.  °[facility] ° [address], [place] Phone: (336) 832-3150, Fax: (336) 832-3151. Hours of Operation:  8:30 am - 5:30 pm, M-F.  Also accepts Medicaid and self-pay.  °[facility] [place] [address], [place] Phone: (336) 878-6027   °[facility] [address], [place], [place] (336)723-1848, Ext. 123 Mondays & Thursdays: 7-9 AM.  First 15 patients are seen on a first come, first serve basis. °  ° °Medicaid-accepting [place] Providers: ° °Organization         Address  Phone   Notes  °[facility] [address], [place] (336) 641-2100 Also accepts self-pay patients.  °[facility] [address], [place] ° (336) 856-9996   °[facility] [address], [place] (336) 288-8857   °[facility] [address] [place] [address], [place] (336) 299-7000   °Veita Bland [address], [place]  ° (336) 373-1557 Only accepts [place] Access Medicaid patients after they have their name applied to their card.  ° °Self-Pay (no insurance) in [place]: ° °Organization         Address  Phone   Notes  °Sickle Cell Patients, [facility] [address], [place] (336) 832-1970   °[facility] [address], [place] (336) 832-4400   °[facility] ° [address], [place] (336) 992-4800   °[facility]/Dr. Osei-Bonsu ° [address] [place] [address], [place] or [address], [place] (336) 841-8500 Phone number for both [place] and [place] locations is the same.  °[facility] [address], [place] (336) 299-0000   °[facility] [address] [place] [address], [place] or [address] (336) 852-7530 °(336) 878-2260   °[facility] [address],  (336) 350-1642, phone; (336) 294-5005, fax Sees patients 1st and 3rd Saturday of every month.  Must not qualify for public or private insurance (i.e. Medicaid, Medicare, [place] Health Choice, Veterans' Benefits) • Household income should be no more than 200% of the poverty level •The clinic cannot treat you if you are pregnant or think you are pregnant • Sexually transmitted diseases are not treated at the clinic.  ° ° °Dental Care: °Organization         Address  Phone  Notes  °[facility] [facility] [address], [place] (336) 641-6152 Accepts children up to age 21 who are enrolled in Medicaid or [place] Health Choice; pregnant women with a Medicaid card; and children who have applied for Medicaid or [place] Health Choice, but were declined, whose parents can pay a reduced fee at time of service.  °[facility] [place]  [address], [place] (336) 641-7733 Accepts children up to age 21 who are enrolled in Medicaid or [place] Health Choice; pregnant women with a Medicaid card; and children who have applied for Medicaid or [place] Health Choice, but were declined, whose parents can pay a reduced fee at time of service.  °[facility] ° [address], [place] (336) 641-4533 Patients are seen by appointment only. Walk-ins are not accepted. [facility] will see patients 18 years of age and older. °Monday - Tuesday (8am-5pm) °Most Wednesdays (8:30-5pm) °$30 per visit, cash only  °[facility] ° [address], [place] (336) 641-4533 Patients are seen by appointment only. Walk-ins are not  accepted. [facility] will see patients 18 years of age and older. °One Wednesday Evening (Monthly: Volunteer Based).  $30 per visit, cash only  °[facility]  (919) 537-3737 for adults; Children under age 4, call Graduate Pediatric Dentistry at (919) [phone]. Children aged 4-14, please call (919) [phone] to request a pediatric application. ° Dental services are provided in all areas of dental care including fillings, crowns and bridges, complete and partial dentures, implants, gum treatment, root canals, and extractions. Preventive care is also provided. Treatment is provided to both adults and children. °Patients are selected via a lottery and there is often a waiting list. °  °[facility] [address], °[place] ° (336) 763-8833 [email or web address] °  °[facility] [address], [place], [place] (336)723-1848, Ext. 123 Second and Fourth Thursday of each month, opens at 6:30 AM; Clinic ends at 9 AM.  Patients are seen on a first-come first-served basis, and a limited number are seen during each clinic.  ° °[facility] ° [address], [place], [place] (336) 723-7904   Eligibility Requirements °You must have lived in [place], [place], or [place] counties for at least the last three months. °  You cannot be eligible for state or federal sponsored healthcare insurance, including Veterans Administration, Medicaid, or Medicare. °  You generally cannot be eligible for healthcare insurance through your employer.  °  How to apply: °Eligibility screenings are held every Tuesday and Wednesday afternoon from 1:00 pm until 4:00 pm. You do not need an appointment for the interview!  °[facility] [address], [place], [place] 336-631-2330   °[facility]  336-342-8273   °[facility]  336-703-3100   °[facility]  336-570-6415   ° °Behavioral Health Resources in the  Community: °Intensive Outpatient Programs °Organization         Address  Phone  Notes  °[facility] [address], [place], [place] 336-878-6098   °[facility] [address], [place], [place] 336-832-9800   °[facility] [address], [place], [place] ° 336-882-2125   °[facility] [address],  °[place], [place] 1-800-853-5163 or 336-641-4981   °Substance Abuse Resources °Organization           Address  Phone  Notes  °[facility]  336-882-2125   °[facility]  336-784-9470   °[facility]  336-285-9073   °[facility]  336-845-3988   °Residential & Outpatient Substance Abuse Program  1-800-659-3381   °Psychological Services °Organization         Address  Phone  Notes  °[facility]  336- 832-9600   °[facility]  336- 378-7881   °[facility] [address], [place] 1-800-853-5163 or 336-641-4981   ° °Mobile Crisis Teams °Organization         Address  Phone  Notes  °[facility], Mobile Crisis Care Unit  1-877-626-1772   °Assertive °[facility] ° [address] [place], [place] 336-834-9664   °Sharon DeEsch [address] °[place] 336-554-5454   ° °Self-Help/Support Groups °Organization         Address  Phone             Notes  °[facility] - variety of support groups  336- 373-1402 Call for more information  °Narcotics Anonymous (NA), [facility] [address], °[place]  2 meetings at this location  ° °Residential Treatment Programs °Organization         Address  Phone  Notes  °[facility] [address],    °[place]  1-866-801-8205   °[facility] ° [address], [place], [place] 704-293-8524   °[facility] [address], [place] 336-845-3988 Admissions: 8am-3pm M-F  °[facility] [address]    °[place], [place] 336-841-1104   °[facility] [address], [place], [place] 336-379-7146   °[facility] [address]  °[place], [place] 336-285-9073   °[facility] Intensive Outpatient [address], [place], [place] 336-852-3033   °[facility] [address]  °[place], [place] 1-877-615-2722 or 336-784-9470   °[facility] [address] [place], [place] 336-227-7417 Accepts Medicaid  °[facility] [address]  °[place] 1-800-659-3381 Substance Abuse/Addiction Treatment  ° °[place] Behavioral Health Resources °Organization         Address  Phone  Notes  °[facility]  (888) 581-9988   °Julie Brannon, PhD [address] [place], [place]   (336) 349-5553 or (336) 951-0000   °[facility]   [address] °[place], [place] (336) 349-4454   °[facility] [address], [place], [place] (336) 342-8316 Insurance/Medicaid/sponsorship through [facility]  °[facility] [address]                                    [place], [place] (336) 342-8316 Therapy/tele-psych/case  °[facility] [address]  ° [place],  (336) 349-2233    °Dr. Arfeen  (336) 349-4544   °[facility] [place]  [employer] [place] [facility] 1) [address], [place] °2) [address], [place] °3)  [address], [place] (336) 349-3220 °(336) 342-7768 ° °(336) 342-8140   °[place] [facility] (336) 342-1394 or (336) 342-3537 (After Hours)    ° ° ° °

## 2014-10-07 NOTE — ED Provider Notes (Signed)
CSN: [id number]     Arrival date & time 10/07/14  1402 History   First MD Initiated Contact with Patient 10/07/14 1511     Chief Complaint  Patient presents with  . Flank Pain  . Dysuria   Patient is a 23 y.o. female presenting with flank pain and dysuria.  Flank Pain Associated symptoms include abdominal pain, chills, fatigue, a fever and nausea. Pertinent negatives include no chest pain, coughing or vomiting.  Dysuria Associated symptoms: abdominal pain, fever, flank pain and nausea   Associated symptoms: no vaginal discharge and no vomiting    Patient is [name] who presents to the ED with left flank pain, myalgias, and nausea x 1 day.  Patient states that yesterday she was feeling like she had flu like symptoms of subjective fever, chills, myalgias, and fatigue.  In the afternoon the patient started having left flank pain.  Patient states that the left flank pain is constant and severe and is wrapping around her abdomen.  Patient states that she has had a history of both kidney infections and kidney stones.  Patient states that she is not followed by a urologist.  She states that she is not having any vaginal discharge or vaginal bleeding.  Patient denies hematuria, dysuria, frequency, and urgency.  Patient does state that she has had decreased urine output.    Past Medical History  Diagnosis Date  . Shingles   . Medical history non-contributory    Past Surgical History  Procedure Laterality Date  . Knee surgery    . Knee surgery     Family History  Problem Relation Age of Onset  . Diabetes Paternal Grandmother   . Diabetes Paternal Grandfather    History  Substance Use Topics  . Smoking status: Current Every Day Smoker -- 0.25 packs/day    Types: Cigarettes  . Smokeless tobacco: Never Used  . Alcohol Use: No   OB History   Grav Para Term Preterm Abortions TAB SAB Ect Mult Living   4 3 3  1  0 1   3     Review of Systems  Constitutional: Positive for fever,  chills and fatigue.  Respiratory: Positive for shortness of breath. Negative for cough and chest tightness.   Cardiovascular: Negative for chest pain and palpitations.  Gastrointestinal: Positive for nausea and abdominal pain. Negative for vomiting, diarrhea, constipation and blood in stool.  Genitourinary: Positive for dysuria, flank pain, decreased urine volume and difficulty urinating. Negative for urgency, frequency, hematuria, vaginal bleeding, vaginal discharge and vaginal pain.  All other systems reviewed and are negative.     Allergies  Review of patient's allergies indicates no known allergies.  Home Medications   Prior to Admission medications   Medication Sig Start Date End Date Taking? Authorizing Provider  ibuprofen (ADVIL,MOTRIN) 200 MG tablet Take 600 mg by mouth every 6 (six) hours as needed for mild pain.   Yes Historical Provider, MD  cephALEXin (KEFLEX) 500 MG capsule Take 1 capsule (500 mg total) by mouth 3 (three) times daily. 10/07/14 10/21/14  Kazimir Hartnett A Forcucci, PA-C  ondansetron (ZOFRAN) 4 MG tablet Take 1 tablet (4 mg total) by mouth every 6 (six) hours. 10/07/14   Gillian Meeuwsen A Forcucci, PA-C  oxyCODONE-acetaminophen (PERCOCET/ROXICET) 5-325 MG per tablet Take 1 tablet by mouth every 6 (six) hours as needed for moderate pain or severe pain. 10/07/14   Mikeyla Music A Forcucci, PA-C   BP 93/50  Pulse 97  Temp(Src) 99.9 F (37.7 C) (Oral)  Resp 14  SpO2 99%  LMP 09/29/2014 Physical Exam  Nursing note and vitals reviewed. Constitutional: She is oriented to person, place, and time. She appears well-developed and well-nourished. No distress.  HENT:  Head: Normocephalic and atraumatic.  Mouth/Throat: Oropharynx is clear and moist. No oropharyngeal exudate.  Eyes: Conjunctivae and EOM are normal. Pupils are equal, round, and reactive to light. No scleral icterus.  Neck: Normal range of motion. Neck supple. No JVD present. No thyromegaly present.  Cardiovascular:  Normal rate, regular rhythm, normal heart sounds and intact distal pulses.  Exam reveals no gallop and no friction rub.   No murmur heard. Pulmonary/Chest: Effort normal and breath sounds normal. No respiratory distress. She has no wheezes. She has no rales. She exhibits no tenderness.  Abdominal: Soft. Normal appearance and bowel sounds are normal. She exhibits no distension and no mass. There is tenderness in the suprapubic area and left lower quadrant. There is CVA tenderness (left). There is no rebound, no guarding, no tenderness at McBurney's point and negative Murphy's sign.  Genitourinary: No labial fusion. There is no rash, tenderness, lesion or injury on the right labia. There is no rash, tenderness, lesion or injury on the left labia. Cervix exhibits no motion tenderness and no friability. Right adnexum displays no mass, no tenderness and no fullness. Left adnexum displays no mass, no tenderness and no fullness. No erythema, tenderness or bleeding around the vagina. No foreign body around the vagina. No signs of injury around the vagina. No vaginal discharge found.  Blood in the vaginal vault  Musculoskeletal: Normal range of motion.  Lymphadenopathy:    She has no cervical adenopathy.  Neurological: She is alert and oriented to person, place, and time.  Skin: Skin is warm and dry. She is not diaphoretic.  Psychiatric: She has a normal mood and affect. Her behavior is normal. Judgment and thought content normal.    ED Course  Procedures (including critical care time) Labs Review Labs Reviewed  WET PREP, GENITAL - Abnormal; Notable for the following:    Clue Cells Wet Prep HPF POC FEW (*)    WBC, Wet Prep HPF POC FEW (*)    All other components within normal limits  URINALYSIS, ROUTINE W REFLEX MICROSCOPIC - Abnormal; Notable for the following:    Color, Urine AMBER (*)    APPearance CLOUDY (*)    Hgb urine dipstick TRACE (*)    Ketones, ur 40 (*)    Protein, ur 30 (*)    Nitrite  POSITIVE (*)    Leukocytes, UA SMALL (*)    All other components within normal limits  CBC WITH DIFFERENTIAL - Abnormal; Notable for the following:    Hemoglobin 11.7 (*)    HCT 35.1 (*)    Monocytes Absolute 1.2 (*)    All other components within normal limits  COMPREHENSIVE METABOLIC PANEL - Abnormal; Notable for the following:    Potassium 3.4 (*)    Anion gap 17 (*)    All other components within normal limits  URINE MICROSCOPIC-ADD ON - Abnormal; Notable for the following:    Bacteria, UA MANY (*)    Casts GRANULAR CAST (*)    All other components within normal limits  GC/CHLAMYDIA PROBE AMP  RPR  HIV ANTIBODY (ROUTINE TESTING)  POC URINE PREG, ED    Imaging Review No results found.   EKG Interpretation None      MDM   Final diagnoses:  Pyelonephritis   Patient is a 23 y.o.  Female who presents to the ED with left flank pain and urinary symptoms.  Physical exam revealed left CVA tenderness to palpation, LLQ, and suprapubic pain.  Vitals are stable.  Patient is non-toxic appearing.  CBC shows hgb at baseline with mild anemia, and no leukocytosis.  CMP reveals mild hypokalemia.  UA reveals positive leukocytes, nitrites, and granular casts.  Urine pregnancy is negative.  GC, HIV, and RPR pending.  Wet prep reveals few WBCs and clue cells.  Doubt PID.  Given no symptoms will not treat BV at this time.  Suspect that this is UTI vs early pyelonephritis.  Patient treated here with 1 g ceftriaxone IV, NS bolus, fentanyl, and dilaudid.  Patient is tolerating PO at this time.  Patient is stable for discharge at this time with prescriptions for percocet #6, keflex 500 TID x 14 days, and zofran prn.  Patient to return for intractable fever, pain, nausea, and vomiting, or difficulty urinating.  Patient states understanding and agreement.  Patient is stable for discharge.  Patient discussed with Dr. Lynelle DoctorKnapp who agrees with the above workup and plan.    Eben Burowourtney A Forcucci, PA-C 10/07/14  1815

## 2014-10-07 NOTE — ED Provider Notes (Signed)
Medical screening examination/treatment/procedure(s) were performed by non-physician practitioner and as supervising physician I was immediately available for consultation/collaboration.    Linwood DibblesJon Kirsten Spearing, MD 10/07/14 [phone]

## 2014-10-07 NOTE — ED Notes (Signed)
Bed: WA03 Expected date:  Expected time:  Means of arrival:  Comments: Fast track 

## 2014-10-07 NOTE — Progress Notes (Signed)
  CARE MANAGEMENT ED NOTE 10/07/2014  Patient:  Stacy Peterson,Stacy Peterson   Account Number:  [id number]  Date Initiated:  10/07/2014  Documentation initiated by:  Radford PaxFERRERO,Vadhir Mcnay  Subjective/Objective Assessment:   Patient presents to ED with left flank pain and urinary symptoms     Subjective/Objective Assessment Detail:   Patient with history of kidney infections and kidney stones.     Action/Plan:   Action/Plan Detail:   Anticipated DC Date:  10/07/2014     Status Recommendation to Physician:   Result of Recommendation:    Other ED Services  Consult Working Plan    DC Planning Services  Other  PCP issues    Choice offered to / List presented to:            Status of service:  Completed, signed off  ED Comments:   ED Comments Detail:  EDCM spoke topatient at bedside.  Patient confirms she has Medicaid insurance without a pcp.  Patient reports Medicaid has sent her a letter to chose a pcp.  [facility] list of [facility] who accept patients with [place] insurance in [place].  Encouraged patient to find pcp soon.  Patient verbalized understanding.  No further EDCM needs at this time.

## 2014-10-08 LAB — GC/CHLAMYDIA PROBE AMP
CT Probe RNA: NEGATIVE
GC Probe RNA: NEGATIVE

## 2014-10-26 ENCOUNTER — Encounter (HOSPITAL_COMMUNITY): Payer: Self-pay | Admitting: Emergency Medicine

## 2015-05-06 ENCOUNTER — Encounter (HOSPITAL_COMMUNITY): Payer: Self-pay | Admitting: Emergency Medicine

## 2015-05-06 ENCOUNTER — Emergency Department (HOSPITAL_COMMUNITY)
Admission: EM | Admit: 2015-05-06 | Discharge: 2015-05-07 | Disposition: A | Discharge status: Home / Self Care | Payer: Medicaid Other | Attending: Emergency Medicine | Admitting: Emergency Medicine

## 2015-05-06 ENCOUNTER — Emergency Department (HOSPITAL_COMMUNITY): Payer: Medicaid Other

## 2015-05-06 DIAGNOSIS — R51 Headache: Secondary | ICD-10-CM

## 2015-05-06 DIAGNOSIS — Y9389 Activity, other specified: Secondary | ICD-10-CM | POA: Diagnosis not present

## 2015-05-06 DIAGNOSIS — R519 Headache, unspecified: Secondary | ICD-10-CM

## 2015-05-06 DIAGNOSIS — Z8619 Personal history of other infectious and parasitic diseases: Secondary | ICD-10-CM | POA: Insufficient documentation

## 2015-05-06 DIAGNOSIS — S0990XA Unspecified injury of head, initial encounter: Secondary | ICD-10-CM | POA: Diagnosis present

## 2015-05-06 DIAGNOSIS — Y998 Other external cause status: Secondary | ICD-10-CM | POA: Diagnosis not present

## 2015-05-06 DIAGNOSIS — Z720 Tobacco use: Secondary | ICD-10-CM | POA: Insufficient documentation

## 2015-05-06 DIAGNOSIS — S161XXA Strain of muscle, fascia and tendon at neck level, initial encounter: Secondary | ICD-10-CM

## 2015-05-06 DIAGNOSIS — Y92410 Unspecified street and highway as the place of occurrence of the external cause: Secondary | ICD-10-CM | POA: Diagnosis not present

## 2015-05-06 MED ORDER — HYDROCODONE-ACETAMINOPHEN 5-325 MG PO TABS
1.0000 | ORAL_TABLET | Freq: Once | ORAL | Status: AC
  Administered 2015-05-06: 1 via ORAL
  Filled 2015-05-06: qty 1

## 2015-05-06 MED ORDER — METOCLOPRAMIDE HCL 10 MG PO TABS: 10.0000 mg | ORAL_TABLET | Freq: Four times a day (QID) | ORAL | Status: DC | PRN

## 2015-05-06 MED ORDER — ONDANSETRON 8 MG PO TBDP
8.0000 mg | ORAL_TABLET | Freq: Once | ORAL | Status: AC
  Administered 2015-05-06: 8 mg via ORAL
  Filled 2015-05-06: qty 1

## 2015-05-06 MED ORDER — NAPROXEN 500 MG PO TABS: 500.0000 mg | ORAL_TABLET | Freq: Two times a day (BID) | ORAL | Status: DC

## 2015-05-06 MED ORDER — NAPROXEN 500 MG PO TABS
500.0000 mg | ORAL_TABLET | Freq: Once | ORAL | Status: AC
  Administered 2015-05-06: 500 mg via ORAL
  Filled 2015-05-06: qty 1
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
## 2015-05-06 NOTE — Discharge Instructions (Signed)
The CT of your head and neck did not show any acute injury. Recommend that you take naproxen as prescribed for pain. You may take Reglan as prescribed for nausea/vomiting or persistent headache. Recommend you alternate ice and heat to areas of injury. Given your vomiting and headache, it is likely that you suffered a minor head injury such as a mild concussion. For this reason, you will be placed on head injury precautions for 1 week. During this time, your not allowed to drive, participate in heavy lifting or contact sports, or operate heavy machinery. Follow up with a primary care doctor in 1 week to be cleared from these precautions. Return to the emergency department as needed if symptoms worsen.  Head Injury You have received a head injury. It does not appear serious at this time. Headaches and vomiting are common following head injury. It should be easy to awaken from sleeping. Sometimes it is necessary for you to stay in the emergency department for a while for observation. Sometimes admission to the hospital may be needed. After injuries such as yours, most problems occur within the first 24 hours, but side effects may occur up to 7-10 days after the injury. It is important for you to carefully monitor your condition and contact your health care provider or seek immediate medical care if there is a change in your condition. WHAT ARE THE TYPES OF HEAD INJURIES? Head injuries can be as minor as a bump. Some head injuries can be more severe. More severe head injuries include:  A jarring injury to the brain (concussion).  A bruise of the brain (contusion). This mean there is bleeding in the brain that can cause swelling.  A cracked skull (skull fracture).  Bleeding in the brain that collects, clots, and forms a bump (hematoma). WHAT CAUSES A HEAD INJURY? A serious head injury is most likely to happen to someone who is in a car wreck and is not wearing a seat belt. Other causes of major head  injuries include bicycle or motorcycle accidents, sports injuries, and falls. HOW ARE HEAD INJURIES DIAGNOSED? A complete history of the event leading to the injury and your current symptoms will be helpful in diagnosing head injuries. Many times, pictures of the brain, such as CT or MRI are needed to see the extent of the injury. Often, an overnight hospital stay is necessary for observation.  WHEN SHOULD I SEEK IMMEDIATE MEDICAL CARE?  You should get help right away if:  You have confusion or drowsiness.  You feel sick to your stomach (nauseous) or have continued, forceful vomiting.  You have dizziness or unsteadiness that is getting worse.  You have severe, continued headaches not relieved by medicine. Only take over-the-counter or prescription medicines for pain, fever, or discomfort as directed by your health care provider.  You do not have normal function of the arms or legs or are unable to walk.  You notice changes in the black spots in the center of the colored part of your eye (pupil).  You have a clear or bloody fluid coming from your nose or ears.  You have a loss of vision. During the next 24 hours after the injury, you must stay with someone who can watch you for the warning signs. This person should contact local emergency services (911 in the [place] if you have seizures, you become unconscious, or you are unable to wake up. HOW CAN I PREVENT A HEAD INJURY IN THE FUTURE? The most important factor for preventing  major head injuries is avoiding motor vehicle accidents. To minimize the potential for damage to your head, it is crucial to wear seat belts while riding in motor vehicles. Wearing helmets while bike riding and playing collision sports (like football) is also helpful. Also, avoiding dangerous activities around the house will further help reduce your risk of head injury.  WHEN CAN I RETURN TO NORMAL ACTIVITIES AND ATHLETICS? You should be reevaluated by your health care  provider before returning to these activities. If you have any of the following symptoms, you should not return to activities or contact sports until 1 week after the symptoms have stopped:  Persistent headache.  Dizziness or vertigo.  Poor attention and concentration.  Confusion.  Memory problems.  Nausea or vomiting.  Fatigue or tire easily.  Irritability.  Intolerant of bright lights or loud noises.  Anxiety or depression.  Disturbed sleep. MAKE SURE YOU:   Understand these instructions.  Will watch your condition.  Will get help right away if you are not doing well or get worse. Document Released: 12/11/2005 Document Revised: 12/16/2013 Document Reviewed: 08/18/2013 [facility] Patient Information 2015 [employer], [place]. This information is not intended to replace advice given to you by your health care provider. Make sure you discuss any questions you have with your health care provider.

## 2015-05-06 NOTE — ED Notes (Signed)
Returned from CT.

## 2015-05-13 ENCOUNTER — Encounter (HOSPITAL_COMMUNITY): Payer: Self-pay | Admitting: Emergency Medicine

## 2015-05-13 ENCOUNTER — Emergency Department (HOSPITAL_COMMUNITY)
Admission: EM | Admit: 2015-05-13 | Discharge: 2015-05-13 | Disposition: A | Discharge status: Home / Self Care | Payer: Medicaid Other | Attending: Emergency Medicine | Admitting: Emergency Medicine

## 2015-05-13 DIAGNOSIS — R451 Restlessness and agitation: Secondary | ICD-10-CM | POA: Insufficient documentation

## 2015-05-13 DIAGNOSIS — Z8619 Personal history of other infectious and parasitic diseases: Secondary | ICD-10-CM | POA: Insufficient documentation

## 2015-05-13 DIAGNOSIS — Z791 Long term (current) use of non-steroidal anti-inflammatories (NSAID): Secondary | ICD-10-CM | POA: Insufficient documentation

## 2015-05-13 DIAGNOSIS — Z79899 Other long term (current) drug therapy: Secondary | ICD-10-CM | POA: Insufficient documentation

## 2015-05-13 DIAGNOSIS — Z720 Tobacco use: Secondary | ICD-10-CM | POA: Insufficient documentation

## 2015-05-13 NOTE — Discharge Instructions (Signed)
Please use resources below to follow up with a psychiatrist for further management of your mental health.  Return to ER if you feel suicidal, homicidal or if you have other concerns.   Emergency Department Resource Guide 1) Find a Doctor and Pay Out of Pocket Although you won't have to find out who is covered by your insurance plan, it is a good idea to ask around and get recommendations. You will then need to call the office and see if the doctor you have chosen will accept you as a new patient and what types of options they offer for patients who are self-pay. Some doctors offer discounts or will set up payment plans for their patients who do not have insurance, but you will need to ask so you aren't surprised when you get to your appointment.  2) Contact Your Local Health Department Not all health departments have doctors that can see patients for sick visits, but many do, so it is worth a call to see if yours does. If you don't know where your local health department is, you can check in your phone book. The CDC also has a tool to help you locate your state's health department, and many state websites also have listings of all of their local health departments.  3) Find a Walk-in Clinic If your illness is not likely to be very severe or complicated, you may want to try a walk in clinic. These are popping up all over the country in pharmacies, drugstores, and shopping centers. They're usually staffed by nurse practitioners or physician assistants that have been trained to treat common illnesses and complaints. They're usually fairly quick and inexpensive. However, if you have serious medical issues or chronic medical problems, these are probably not your best option.  No Primary Care Doctor: - Call Health Connect at  [phone] - they can help you locate a primary care doctor that  accepts your insurance, provides certain services, etc. - Physician Referral Service- [phone]  Chronic Pain  Problems: Organization         Address  Phone   Notes  [facility]  [phone] Patients need to be referred by their primary care doctor.   Medication Assistance: Organization         Address  Phone   Notes  [facility] [address] [place], [place] [address] [phone] --Must be a resident of [facility] -- Must have NO insurance coverage whatsoever (no Medicaid/ Medicare, etc.) -- The pt. MUST have a primary care doctor that directs their care regularly and follows them in the community   [facility]ist  [phone]   [employer]  [phone]) [phone]    Agencies that provide inexpensive medical care: Organization         Address  Phone   Notes  [facility]  [phone]   [facility]    [phone]   [facility] [address] [place], [place] [address] [phone]   [facility] [address], [place] [phone]   [facility]anned Parenthood    [phone]   [facility]    [phone]   [facility]  [address], [place] Phone:  [phone], Fax:  [phone] Hours of Operation:  9 am - 6 pm, M-F.  Also accepts Medicaid/Medicare and self-pay.  [facility]  [address], [place] Phone: [phone], Fax: [phone]. Hours of Operation:  8:30 am - 5:30 pm, M-F.  Also accepts Medicaid and self-pay.  °[facility] [place] [address], [place] Phone: [phone]   °[facility] [address], [place], [place] [phone], Ext. [phone] Mondays & Thursdays: 7-9 AM.  First 15 patients are seen on a first come, first serve basis. °  ° °Medicaid-accepting [place] Providers: ° °Organization         Address  Phone   Notes  °[facility] [address], [place] [phone] Also  accepts self-pay patients.  °[facility] [address], [place] ° [phone]   °[facility] [address], [place] [phone]   °[facility] [address] [place] [address], [place] [phone]   °Veita Bland [address], [place]  ° [phone] Only accepts [place] Access Medicaid patients after they have their name applied to their card.  ° °Self-Pay (no insurance) in [place]: ° °Organization         Address  Phone   Notes  °Sickle Cell Patients, [facility] [address], [place] [phone]   °[facility] [address], [place] [phone]   °[facility] [place] ° [address], [place] [phone]   °[facility]/Dr. Osei-Bonsu ° [address] [place] [address], [place] or [address], [place] [phone] Phone number for both [place] and [place] locations is the same.  °[facility] [address], [place] [phone]   °[facility] [address] [place] [address], [place] or [address] [phone] °([phone]   °[facility] [address], [place] [phone], phone; [phone], fax Sees patients 1st and 3rd Saturday of every month.  Must not qualify for public or private insurance (i.e. Medicaid, Medicare, [place] Health Choice, Veterans' Benefits) • Household income should be no more than 200% of the poverty level •The clinic cannot treat you if you are pregnant or think you are pregnant • Sexually transmitted diseases are not treated at the clinic.  ° ° °Dental Care: °Organization         Address  Phone  Notes  °[place] [facility] [address], [place] [phone] Accepts children up to age 21 who are enrolled in Medicaid or [place] Health Choice; pregnant  women with a Medicaid card; and children who have applied for Medicaid or [place] Health Choice, but were declined, whose parents can pay a reduced fee at time of service.  °[place] [facility] [place]  [address], [place] [phone] Accepts children up to age 21 who are enrolled in Medicaid or [place] Health Choice; pregnant women with a Medicaid card; and children who have applied for Medicaid or  Health Choice, but were declined, whose parents can pay a reduced fee at time of service.  °[facility] ° [address],  [phone] Patients are seen by appointment only. Walk-ins are not accepted. [facility] will see patients 18 years of age and older. °Monday - Tuesday (8am-5pm) °Most Wednesdays (8:30-5pm) °$30 per visit, cash only  °[facility]  [address], [facility] [phone] Patients are seen by appointment only. Walk-ins are not accepted. [place] will see patients 60 years of age and older. One Wednesday Evening (Monthly: Volunteer Based).  $30 per visit, cash only  [place]  [phone] for adults; Children under age 4, call Graduate Pediatric Dentistry at [phone]. Children aged 49-14, please call [phone] to request a pediatric application.  Dental services are provided in all areas of dental care including fillings, crowns and bridges, complete and partial dentures, implants, gum treatment, root canals, and extractions. Preventive care is also provided. Treatment is provided to both adults and children. Patients are selected via a lottery and there is often a waiting list.   [facility] [address] [place]  [phone] [email or web address]   [facility] [address] [place], [place] [phone], Ext. [phone] Second and Fourth Thursday of each month, opens at 6:30 AM; Clinic ends at 9 AM.  Patients are  seen on a first-come first-served basis, and a limited number are seen during each clinic.   [facility]  [address] Hillard Danker [place], [place] [phone]   Eligibility Requirements You must have lived in [place], [place], or [place] counties for at least the last three months.   You cannot be eligible for state or federal sponsored [employer], including [employer], [place], or [employer].   You generally cannot be eligible for healthcare insurance through your employer.    How to apply: Eligibility screenings are held every Tuesday and Wednesday afternoon from 1:00 pm until 4:00 pm. You do not need an appointment for the interview!  [facility] [address] [place], [place]   [place] Department  [place]  [phone]    Behavioral Health Resources in the Community: Intensive Outpatient Programs Organization         Address  Phone  Notes  [facility] [place]. [address], [place], [place] [phone]   [facility] [address], [place], [place]   [facility]: [facility] [address] [place] [address], [place], [place]   [place] [address],  [place], [place] or [phone]   Substance Abuse Resources Organization         Address  Phone  Notes  [facility]  [phone]   [place]  [phone]   [facility] [place]   Chinita Pester  [phone]   Residential & Outpatient Substance Abuse Program  [phone]   Psychological Services Organization         Address  Phone  Notes  [facility] [place]  [place]  [phone]   [place] [address] [place] [phone] or 7327526419    Mobile Crisis  Teams Organization         Address  Phone  Notes  [facility], Mobile Crisis Care Unit  [phone]   [facility]  [address] [place], [place]   [facility] [address] [place] [phone]    Self-Help/Support Groups Organization         Address  Phone             Notes  [facility]  [facility] [place] - variety of support groups  [place] Call for more information  Narcotics Anonymous (NA), [facility] [address], [employer] [place]  2 meetings at this location   Special educational needs teacher         Address  Phone  Notes  [facility] [place],    [place]  [phone]   [facility]  [address], [place] [address], [place], [place]   [place], [place] [phone] Admissions: 8am-3pm M-F  Incentives Substance [place] [address],    [place], [place] [phone]   [facility] [address] [place], [place], [place]   [facility] [address]  [place], [place]   [facility] - Intensive Outpatient [address], [place], [place]   [facility] [place]  [place], [place] [phone] or [phone]   [facility] ([facility]) [address] [place], [place] Accepts Medicaid  [facility] [place] [address],  [place] [phone] Substance Abuse/Addiction Treatment   [facility] Organization         Address  Phone  Notes  [facility]  [phone]   Domenic Schwab, PhD [address] Arlis Porta [place], [place]   [phone] or [phone]   [place], [place] [phone]   [facility] [address], [place], [place] [phone]  Insurance/Medicaid/sponsorship through [facility] [address] [place] [address]                                    [place], [place] [phone] [place] [address], [place] [phone]    Dr. Adele Schilder  [phone]   [facility] Dept. 1) [address] [place] 2) [place] 3)  [address] [address], [place] [phone]  [phone]   [place] [phone] or [phone] (After Hours)

## 2015-05-13 NOTE — ED Notes (Signed)
Patient was asked to get urine sample before going into bathroom, patient states "I forgot"

## 2015-05-13 NOTE — ED Notes (Signed)
Per patient, states increase in mood swings-has not been on meds-seeking resources

## 2015-05-13 NOTE — ED Notes (Signed)
Patient states not being able to control her anger, wanted to "punch her sister in the fing face"

## 2015-05-13 NOTE — ED Provider Notes (Signed)
CSN: [id number]     Arrival date & time 05/13/15  1706 History  This chart was scribed for non-physician practitioner, Fayrene HelperBowie Shariah Assad, working with Richardean Canalavid H Yao, MD by Richarda Overlieichard Holland, ED Scribe. This patient was seen in room WTR8/WTR8 and the patient's care was started at 6:08 PM.   Chief Complaint  Patient presents with  . Agitation   The history is provided by the patient. No language interpreter was used.   HPI Comments: Stacy Peterson is a 24 y.o. female who presents to the Emergency Department complaining of increased agitation for the last 2 days for an unknown reason. She says that today she was angry with her 23 year old sister that she wanted "to beat up." She reports that she was dx with bipolar disorder in the past. She states that she has been to anger management for 6 years that she tried as a coping device. She states that she sees "bugs all the time." Pt reports that she quit drinking alcohol in February of this year and denies any other drug use. She says that she had a counselor at family services that she did not get along with. She states that is currently taking no medication at this time. She states she has not been eating or sleeping well recently. She reports her LMP was 5 days ago. Pt reports she gave birth in July of 2015. She denies SI or HI. Pt denies any pain.   Past Medical History  Diagnosis Date  . Shingles   . Medical history non-contributory    Past Surgical History  Procedure Laterality Date  . Knee surgery    . Knee surgery     Family History  Problem Relation Age of Onset  . Diabetes Paternal Grandmother   . Diabetes Paternal Grandfather    History  Substance Use Topics  . Smoking status: Current Every Day Smoker -- 0.25 packs/day    Types: Cigarettes  . Smokeless tobacco: Never Used  . Alcohol Use: No   OB History    Gravida Para Term Preterm AB TAB SAB Ectopic Multiple Living   4 3 3  1  0 1   3     Review of Systems  Musculoskeletal: Negative  for myalgias and arthralgias.  Psychiatric/Behavioral: Positive for agitation. Negative for suicidal ideas.  All other systems reviewed and are negative.  Allergies  Review of patient's allergies indicates no known allergies.  Home Medications   Prior to Admission medications   Medication Sig Start Date End Date Taking? Authorizing Provider  metoCLOPramide (REGLAN) 10 MG tablet Take 1 tablet (10 mg total) by mouth every 6 (six) hours as needed for nausea or vomiting. Patient not taking: Reported on 05/13/2015 05/06/15   Antony MaduraKelly Humes, PA-C  naproxen (NAPROSYN) 500 MG tablet Take 1 tablet (500 mg total) by mouth 2 (two) times daily. Patient not taking: Reported on 05/13/2015 05/06/15   Antony MaduraKelly Humes, PA-C  ondansetron (ZOFRAN) 4 MG tablet Take 1 tablet (4 mg total) by mouth every 6 (six) hours. Patient not taking: Reported on 05/13/2015 10/07/14   Terri Piedraourtney Forcucci, PA-C  oxyCODONE-acetaminophen (PERCOCET/ROXICET) 5-325 MG per tablet Take 1 tablet by mouth every 6 (six) hours as needed for moderate pain or severe pain. Patient not taking: Reported on 05/13/2015 10/07/14   Courtney Forcucci, PA-C   BP 130/80 mmHg  Pulse 68  Temp(Src) 98.2 F (36.8 C) (Oral)  Resp 18  SpO2 100%  LMP 04/29/2015   Physical Exam  Constitutional: She is  oriented to person, place, and time. She appears well-developed and well-nourished.  HENT:  Head: Normocephalic and atraumatic.  Eyes: Right eye exhibits no discharge. Left eye exhibits no discharge.  Neck: Neck supple. No tracheal deviation present.  Cardiovascular: Normal rate, regular rhythm and normal heart sounds.   No murmur heard. Pulmonary/Chest: Effort normal. No respiratory distress. She has no wheezes. She has no rales.  Abdominal: Soft. There is no tenderness.  Neurological: She is alert and oriented to person, place, and time.  Skin: Skin is warm and dry.  Psychiatric: Her mood appears anxious. Her speech is rapid and/or pressured. She is agitated.  Thought content is not paranoid. She expresses no homicidal and no suicidal ideation.  Tearful, does not make eye contact. Pressured speech. Depressed.   Nursing note and vitals reviewed.   ED Course  Procedures   DIAGNOSTIC STUDIES: Oxygen Saturation is 100% on RA, normal by my interpretation.    COORDINATION OF CARE: 6:15 PM Discussed treatment plan with pt at bedside and pt agreed to plan. Discussed with pt that I will give her psychiatric resources to follow up with.   Pt is not SI/HI, and mentating appropriately and willing to f/u.    Labs Review Labs Reviewed - No data to display  Imaging Review No results found.   EKG Interpretation None      MDM   Final diagnoses:  Restlessness and agitation   BP 130/80 mmHg  Pulse 68  Temp(Src) 98.2 F (36.8 C) (Oral)  Resp 18  SpO2 100%  LMP 04/29/2015  I personally performed the services described in this documentation, which was scribed in my presence. The recorded information has been reviewed and is accurate.      Fayrene HelperBowie Merle Cirelli, PA-C 05/13/15 1906  Richardean Canalavid H Yao, MD 05/13/15 [phone]

## 2015-05-26 ENCOUNTER — Inpatient Hospital Stay (HOSPITAL_COMMUNITY)
Admission: AD | Admit: 2015-05-26 | Discharge: 2015-05-26 | Disposition: A | Discharge status: Home / Self Care | Payer: Self-pay | Source: Ambulatory Visit | Attending: Obstetrics & Gynecology | Admitting: Obstetrics & Gynecology

## 2015-05-26 ENCOUNTER — Encounter (HOSPITAL_COMMUNITY): Payer: Self-pay | Admitting: *Deleted

## 2015-05-26 DIAGNOSIS — Z202 Contact with and (suspected) exposure to infections with a predominantly sexual mode of transmission: Secondary | ICD-10-CM | POA: Insufficient documentation

## 2015-05-26 DIAGNOSIS — F17210 Nicotine dependence, cigarettes, uncomplicated: Secondary | ICD-10-CM | POA: Insufficient documentation

## 2015-05-26 HISTORY — DX: Trichomonal vulvovaginitis: A59.01

## 2015-05-26 LAB — URINALYSIS, ROUTINE W REFLEX MICROSCOPIC
Bilirubin Urine: NEGATIVE
Glucose, UA: NEGATIVE mg/dL
HGB URINE DIPSTICK: NEGATIVE
Ketones, ur: NEGATIVE mg/dL
Leukocytes, UA: NEGATIVE
Nitrite: NEGATIVE
PH: 7 (ref 5.0–8.0)
Protein, ur: NEGATIVE mg/dL
SPECIFIC GRAVITY, URINE: 1.02 (ref 1.005–1.030)
Urobilinogen, UA: 0.2 mg/dL (ref 0.0–1.0)

## 2015-05-26 LAB — WET PREP, GENITAL
Trich, Wet Prep: NONE SEEN
YEAST WET PREP: NONE SEEN

## 2015-05-26 LAB — POCT PREGNANCY, URINE: Preg Test, Ur: NEGATIVE

## 2015-05-26 MED ORDER — CEFTRIAXONE SODIUM 250 MG IJ SOLR
250.0000 mg | Freq: Once | INTRAMUSCULAR | Status: AC
  Administered 2015-05-26: 250 mg via INTRAMUSCULAR
  Filled 2015-05-26: qty 250

## 2015-05-26 MED ORDER — ETONOGESTREL-ETHINYL ESTRADIOL 0.12-0.015 MG/24HR VA RING: VAGINAL_RING | VAGINAL | Status: DC

## 2015-05-26 MED ORDER — AZITHROMYCIN 250 MG PO TABS
1000.0000 mg | ORAL_TABLET | Freq: Once | ORAL | Status: AC
  Administered 2015-05-26: 1000 mg via ORAL
  Filled 2015-05-26: qty 4

## 2015-05-26 NOTE — MAU Note (Signed)
Pt presents for STD testing and treatment. States she has been exposed to several and wants to be treated for them. Also wants to be tested for everything else.

## 2015-05-26 NOTE — MAU Provider Note (Signed)
History     CSN: [id number]  Arrival date and time: 05/26/15 1713   None     Chief Complaint  Patient presents with  . Exposure to STD   HPI   Ms. Stacy Peterson is a 24 y.o. female [phone] who presents for STD testing and treatment. Her significant other told her he tested positive for GC and chlamydia; she is no longer with this person, however wants to be treated. She last had intercourse with him 2 weeks ago. She is experiencing a slight vaginal discharge with an odor. She has noticed this for a couple of days. She has not taken anything for the symptoms.   OB History    Gravida Para Term Preterm AB TAB SAB Ectopic Multiple Living   0 1   3      Past Medical History  Diagnosis Date  . Shingles   . Trichomonas vaginitis     Past Surgical History  Procedure Laterality Date  . Knee surgery    . Knee surgery      Family History  Problem Relation Age of Onset  . Diabetes Paternal Grandmother   . Diabetes Paternal Grandfather     History  Substance Use Topics  . Smoking status: Current Every Day Smoker -- 0.50 packs/day    Types: Cigarettes  . Smokeless tobacco: Never Used  . Alcohol Use: No    Allergies: No Known Allergies  Prescriptions prior to admission  Medication Sig Dispense Refill Last Dose  . ibuprofen (ADVIL,MOTRIN) 200 MG tablet Take 600 mg by mouth every 6 (six) hours as needed for headache.   05/25/2015 at Unknown time  . metoCLOPramide (REGLAN) 10 MG tablet Take 1 tablet (10 mg total) by mouth every 6 (six) hours as needed for nausea or vomiting. (Patient not taking: Reported on 05/13/2015) 30 tablet 0 Not Taking at Unknown time  . naproxen (NAPROSYN) 500 MG tablet Take 1 tablet (500 mg total) by mouth 2 (two) times daily. (Patient not taking: Reported on 05/13/2015) 30 tablet 0 Not Taking at Unknown time  . ondansetron (ZOFRAN) 4 MG tablet Take 1 tablet (4 mg total) by mouth every 6 (six) hours. (Patient not taking: Reported on 05/13/2015) 12  tablet 0 Not Taking at Unknown time  . oxyCODONE-acetaminophen (PERCOCET/ROXICET) 5-325 MG per tablet Take 1 tablet by mouth every 6 (six) hours as needed for moderate pain or severe pain. (Patient not taking: Reported on 05/13/2015) 6 tablet 0 Not Taking at Unknown time   Results for orders placed or performed during the hospital encounter of 05/26/15 (from the past 48 hour(s))  Urinalysis, Routine w reflex microscopic (not at [facility])     Status: None   Collection Time: 05/26/15  5:25 PM  Result Value Ref Range   Color, Urine YELLOW YELLOW   APPearance CLEAR CLEAR   Specific Gravity, Urine 1.020 1.005 - 1.030   pH 7.0 5.0 - 8.0   Glucose, UA NEGATIVE NEGATIVE mg/dL   Hgb urine dipstick NEGATIVE NEGATIVE   Bilirubin Urine NEGATIVE NEGATIVE   Ketones, ur NEGATIVE NEGATIVE mg/dL   Protein, ur NEGATIVE NEGATIVE mg/dL   Urobilinogen, UA 0.2 0.0 - 1.0 mg/dL   Nitrite NEGATIVE NEGATIVE   Leukocytes, UA NEGATIVE NEGATIVE    Comment: MICROSCOPIC NOT DONE ON URINES WITH NEGATIVE PROTEIN, BLOOD, LEUKOCYTES, NITRITE, OR GLUCOSE <1000 mg/dL.  Pregnancy, urine POC     Status: None   Collection Time: 05/26/15  5:34 PM  Result  Value Ref Range   Preg Test, Ur NEGATIVE NEGATIVE    Comment:        THE SENSITIVITY OF THIS METHODOLOGY IS >24 mIU/mL   Wet prep, genital     Status: Abnormal   Collection Time: 05/26/15  7:00 PM  Result Value Ref Range   Yeast Wet Prep HPF POC NONE SEEN NONE SEEN   Trich, Wet Prep NONE SEEN NONE SEEN   Clue Cells Wet Prep HPF POC FEW (A) NONE SEEN   WBC, Wet Prep HPF POC FEW (A) NONE SEEN    Comment: MANY BACTERIA SEEN  HIV antibody     Status: None   Collection Time: 05/26/15  7:29 PM  Result Value Ref Range   HIV Screen 4th Generation wRfx Non Reactive Non Reactive    Comment: (NOTE) Performed At: [facility] [place] [address] [place], [place] [id number] Mila HomerHancock William F MD WU:[phone]     Review of Systems  Constitutional: Negative for fever.   Gastrointestinal: Negative for abdominal pain.  Genitourinary: Positive for frequency. Negative for dysuria.   Physical Exam   Blood pressure 137/84, pulse 72, temperature 98.3 F (36.8 C), temperature source Oral, resp. rate 18, height 5\' 5"  (1.651 m), weight 62.143 kg (137 lb), last menstrual period 05/13/2015, unknown if currently breastfeeding.  Physical Exam  Constitutional: She is oriented to person, place, and time. She appears well-developed and well-nourished. No distress.  HENT:  Head: Normocephalic.  Respiratory: Effort normal.  GI: Soft.  Genitourinary:  Wet prep and GC collected without speculum   Neurological: She is alert and oriented to person, place, and time.  Skin: Skin is warm. She is not diaphoretic.  Psychiatric: Her behavior is normal.    MAU Course  Procedures  None  MDM Patient desires nuvaring; she has used in the past without problems. No history of PE or DVT  Rocephin 250 mg IM Azithromycin 1 gram given PO Partner has been treated  GC pending   Assessment and Plan   A:  1. Exposure to STD     P  Discharge home in stable condition Partner needs treatment No intercourse for 7 days Return to MAU if symptoms worsen; for emergencies  Condoms always  RX: Nuvaring; 4 month supply given. Patient desires IUD; patient encouraged to call [facility] or health department.   Duane LopeJennifer I Rasch, NP  05/26/2015 6:57 PM

## 2015-05-27 LAB — GC/CHLAMYDIA PROBE AMP (~~LOC~~) NOT AT ARMC
CHLAMYDIA, DNA PROBE: NEGATIVE
Neisseria Gonorrhea: NEGATIVE

## 2015-05-27 LAB — HIV ANTIBODY (ROUTINE TESTING W REFLEX): HIV SCREEN 4TH GENERATION: NONREACTIVE

## 2015-10-01 ENCOUNTER — Emergency Department (HOSPITAL_COMMUNITY)
Admission: EM | Admit: 2015-10-01 | Discharge: 2015-10-01 | Disposition: A | Discharge status: Home / Self Care | Payer: Self-pay | Attending: Emergency Medicine | Admitting: Emergency Medicine

## 2015-10-01 ENCOUNTER — Emergency Department (HOSPITAL_COMMUNITY): Payer: Medicaid Other

## 2015-10-01 ENCOUNTER — Encounter (HOSPITAL_COMMUNITY): Payer: Self-pay | Admitting: *Deleted

## 2015-10-01 DIAGNOSIS — Z3202 Encounter for pregnancy test, result negative: Secondary | ICD-10-CM | POA: Insufficient documentation

## 2015-10-01 DIAGNOSIS — Z720 Tobacco use: Secondary | ICD-10-CM | POA: Insufficient documentation

## 2015-10-01 DIAGNOSIS — N61 Mastitis without abscess: Secondary | ICD-10-CM | POA: Insufficient documentation

## 2015-10-01 DIAGNOSIS — N644 Mastodynia: Secondary | ICD-10-CM

## 2015-10-01 DIAGNOSIS — Z8619 Personal history of other infectious and parasitic diseases: Secondary | ICD-10-CM | POA: Insufficient documentation

## 2015-10-01 LAB — PREGNANCY, URINE: PREG TEST UR: NEGATIVE

## 2015-10-01 MED ORDER — ONDANSETRON 4 MG PO TBDP
4.0000 mg | ORAL_TABLET | Freq: Once | ORAL | Status: AC
  Administered 2015-10-01: 4 mg via ORAL
  Filled 2015-10-01: qty 1

## 2015-10-01 MED ORDER — HYDROCODONE-ACETAMINOPHEN 5-325 MG PO TABS
1.0000 | ORAL_TABLET | Freq: Once | ORAL | Status: AC
  Administered 2015-10-01: 1 via ORAL
  Filled 2015-10-01: qty 1

## 2015-10-01 MED ORDER — CLINDAMYCIN HCL 150 MG PO CAPS: 300.0000 mg | ORAL_CAPSULE | Freq: Four times a day (QID) | ORAL | Status: DC

## 2015-10-01 MED ORDER — CLINDAMYCIN HCL 300 MG PO CAPS
300.0000 mg | ORAL_CAPSULE | Freq: Once | ORAL | Status: AC
  Administered 2015-10-01: 300 mg via ORAL
  Filled 2015-10-01: qty 1

## 2015-10-01 MED ORDER — IBUPROFEN 800 MG PO TABS
800.0000 mg | ORAL_TABLET | Freq: Once | ORAL | Status: AC
  Administered 2015-10-01: 800 mg via ORAL
  Filled 2015-10-01: qty 1

## 2015-10-01 NOTE — ED Provider Notes (Signed)
CSN: [id number]     Arrival date & time 10/01/15  1412 History   First MD Initiated Contact with Patient 10/01/15 1616     Chief Complaint  Patient presents with  . Breast Pain     (Consider location/radiation/quality/duration/timing/severity/associated sxs/prior Treatment) HPI Comments: 24 year old female who presents with left breast pain. 3 days ago she began having left breast pain which has become progressively worse and is now severe. The pain radiates into her left axilla and down her arm. She has bilateral nipple piercings but they have been there for 6 months. She has noticed some crusting around her nipple but no obvious discharge. No fevers or vomiting. No trauma to her left breast.  The history is provided by the patient.    Past Medical History  Diagnosis Date  . Shingles   . Trichomonas vaginitis    Past Surgical History  Procedure Laterality Date  . Knee surgery    . Knee surgery     Family History  Problem Relation Age of Onset  . Diabetes Paternal Grandmother   . Diabetes Paternal Grandfather    Social History  Substance Use Topics  . Smoking status: Current Every Day Smoker -- 0.50 packs/day    Types: Cigarettes  . Smokeless tobacco: Never Used  . Alcohol Use: No   OB History    Gravida Para Term Preterm AB TAB SAB Ectopic Multiple Living   0 1   3     Review of Systems  10 Systems reviewed and are negative for acute change except as noted in the HPI.   Allergies  Review of patient's allergies indicates no known allergies.  Home Medications   Prior to Admission medications   Medication Sig Start Date End Date Taking? Authorizing Provider  ibuprofen (ADVIL,MOTRIN) 200 MG tablet Take 600 mg by mouth every 6 (six) hours as needed for fever, headache, mild pain, moderate pain or cramping.    Yes Historical Provider, MD  clindamycin (CLEOCIN) 150 MG capsule Take 2 capsules (300 mg total) by mouth 4 (four) times daily. 10/01/15   Laurence Spates, MD  etonogestrel-ethinyl estradiol (NUVARING) 0.12-0.015 MG/24HR vaginal ring Insert vaginally and leave in place for 3 consecutive weeks, then remove for 1 week. Patient not taking: Reported on 10/01/2015 05/26/15   Duane Lope, NP  naproxen (NAPROSYN) 500 MG tablet Take 1 tablet (500 mg total) by mouth 2 (two) times daily. Patient not taking: Reported on 05/13/2015 05/06/15   Antony Madura, PA-C   BP 95/56 mmHg  Pulse 62  Temp(Src) 98.8 F (37.1 C) (Oral)  Resp 18  SpO2 98%  LMP 09/09/2015 Physical Exam  Constitutional: She is oriented to person, place, and time. She appears well-developed and well-nourished.  Tearful, uncomfortable  HENT:  Head: Normocephalic and atraumatic.  Moist mucous membranes  Eyes: Conjunctivae are normal. Pupils are equal, round, and reactive to light.  Neck: Neck supple.  Cardiovascular: Normal rate, regular rhythm and normal heart sounds.   No murmur heard. Pulmonary/Chest: Effort normal and breath sounds normal.  Abdominal: Soft. Bowel sounds are normal. She exhibits no distension. There is no tenderness.  Musculoskeletal: She exhibits no edema.  Neurological: She is alert and oriented to person, place, and time.  Fluent speech  Skin:  Tenderness to palpation of lateral left breast without obvious focal fluctuance or induration, no erythema; small amount of discharge on bra but no obvious purulent drainage from piercing holes  Psychiatric: She has a  normal mood and affect. Judgment normal.  Nursing note and vitals reviewed.   ED Course  Procedures (including critical care time) Labs Review Labs Reviewed  PREGNANCY, URINE   Results for orders placed or performed during the hospital encounter of 10/01/15  Pregnancy, urine  Result Value Ref Range   Preg Test, Ur NEGATIVE NEGATIVE   [place] Chest  10/01/2015   CLINICAL DATA:  Left breast pain.  EXAM: CHEST ULTRASOUND  COMPARISON:  None.  FINDINGS: Limited sonographic evaluation was performed  in the left breast region. No fluid collection or abscess is noted.  IMPRESSION: No abscess or fluid collection seen in area of concern.   Electronically Signed   By: Lupita Raider, M.D.   On: 10/01/2015 18:50     Medications  HYDROcodone-acetaminophen (NORCO/VICODIN) 5-325 MG per tablet 1 tablet (1 tablet Oral Given 10/01/15 1740)  ondansetron (ZOFRAN-ODT) disintegrating tablet 4 mg (4 mg Oral Given 10/01/15 1740)  HYDROcodone-acetaminophen (NORCO/VICODIN) 5-325 MG per tablet 1 tablet (1 tablet Oral Given 10/01/15 1835)  ibuprofen (ADVIL,MOTRIN) tablet 800 mg (800 mg Oral Given 10/01/15 1943)  clindamycin (CLEOCIN) capsule 300 mg (300 mg Oral Given 10/01/15 1943)    MDM   Final diagnoses:  Breast pain, left  Acute mastitis of left breast    24 year old female with 3 days of worsening left breast pain. Patient has piercings in bilateral nipples that are old. On exam, no obvious induration or fluctuance and no erythema to suggest cellulitis. Patient at risk for infection given her piercing. Removed piercing and obtained a soft tissue ultrasound to evaluate for fluid collection. Gave the patient Lortab and Zofran.  [place] was negative for fluid collection. UPT negative. Gave pt ibuprofen. Given small amount of drainage noted from nipple and patient's worsening pain, gave clindamycin to treat for mastitis. Instructed on warm compresses and monitoring for any worsening symptoms/abscess development. Patient voiced understanding of return precautions and was discharged in satisfactory condition.  Laurence Spates, MD 10/01/15 [id number]

## 2015-10-01 NOTE — ED Notes (Signed)
Patient states that 3 days ago her left breast was tender kind of like when she has her period but it is not time for her period. She states that it has gotten progressively worse over the past 3 days. She is now have pain in her axilla and arm. Patient does have a nipple piercings. She has been pierced for about 6 months and had not had any issues before now. She states that the pain seem to come from behind the nipple and she has noticed some crusting at that nipple.

## 2015-10-01 NOTE — ED Notes (Signed)
Ultrasound at bedside

## 2016-01-16 ENCOUNTER — Encounter (HOSPITAL_COMMUNITY): Payer: Self-pay | Admitting: *Deleted

## 2016-01-16 ENCOUNTER — Emergency Department (HOSPITAL_COMMUNITY)
Admission: EM | Admit: 2016-01-16 | Discharge: 2016-01-19 | Disposition: A | Discharge status: Other Acute Inpatient Hospital | Payer: Self-pay | Attending: Emergency Medicine | Admitting: Emergency Medicine

## 2016-01-16 DIAGNOSIS — T50902A Poisoning by unspecified drugs, medicaments and biological substances, intentional self-harm, initial encounter: Secondary | ICD-10-CM | POA: Insufficient documentation

## 2016-01-16 DIAGNOSIS — Z792 Long term (current) use of antibiotics: Secondary | ICD-10-CM | POA: Insufficient documentation

## 2016-01-16 DIAGNOSIS — Y998 Other external cause status: Secondary | ICD-10-CM | POA: Insufficient documentation

## 2016-01-16 DIAGNOSIS — M7981 Nontraumatic hematoma of soft tissue: Secondary | ICD-10-CM | POA: Insufficient documentation

## 2016-01-16 DIAGNOSIS — Y9289 Other specified places as the place of occurrence of the external cause: Secondary | ICD-10-CM | POA: Insufficient documentation

## 2016-01-16 DIAGNOSIS — F17210 Nicotine dependence, cigarettes, uncomplicated: Secondary | ICD-10-CM | POA: Insufficient documentation

## 2016-01-16 DIAGNOSIS — F250 Schizoaffective disorder, bipolar type: Secondary | ICD-10-CM | POA: Diagnosis present

## 2016-01-16 DIAGNOSIS — R451 Restlessness and agitation: Secondary | ICD-10-CM | POA: Insufficient documentation

## 2016-01-16 DIAGNOSIS — Z8619 Personal history of other infectious and parasitic diseases: Secondary | ICD-10-CM | POA: Insufficient documentation

## 2016-01-16 DIAGNOSIS — Y9389 Activity, other specified: Secondary | ICD-10-CM | POA: Insufficient documentation

## 2016-01-16 DIAGNOSIS — Z3202 Encounter for pregnancy test, result negative: Secondary | ICD-10-CM | POA: Insufficient documentation

## 2016-01-16 DIAGNOSIS — F419 Anxiety disorder, unspecified: Secondary | ICD-10-CM | POA: Insufficient documentation

## 2016-01-16 HISTORY — DX: Bipolar disorder, unspecified: F31.9

## 2016-01-16 HISTORY — DX: Schizophrenia, unspecified: F20.9

## 2016-01-16 LAB — COMPREHENSIVE METABOLIC PANEL
ALT: 14 U/L (ref 14–54)
AST: 18 U/L (ref 15–41)
Albumin: 4.6 g/dL (ref 3.5–5.0)
Alkaline Phosphatase: 56 U/L (ref 38–126)
Anion gap: 11 (ref 5–15)
BUN: 13 mg/dL (ref 6–20)
CO2: 23 mmol/L (ref 22–32)
CREATININE: 0.77 mg/dL (ref 0.44–1.00)
Calcium: 9.6 mg/dL (ref 8.9–10.3)
Chloride: 106 mmol/L (ref 101–111)
Glucose, Bld: 113 mg/dL — ABNORMAL HIGH (ref 65–99)
POTASSIUM: 3.8 mmol/L (ref 3.5–5.1)
Sodium: 140 mmol/L (ref 135–145)
Total Bilirubin: 0.9 mg/dL (ref 0.3–1.2)
Total Protein: 7.9 g/dL (ref 6.5–8.1)

## 2016-01-16 LAB — CBC
HCT: 37.1 % (ref 36.0–46.0)
Hemoglobin: 11.9 g/dL — ABNORMAL LOW (ref 12.0–15.0)
MCH: 28 pg (ref 26.0–34.0)
MCHC: 32.1 g/dL (ref 30.0–36.0)
MCV: 87.3 fL (ref 78.0–100.0)
PLATELETS: 281 10*3/uL (ref 150–400)
RBC: 4.25 MIL/uL (ref 3.87–5.11)
RDW: 15.8 % — AB (ref 11.5–15.5)
WBC: 6 10*3/uL (ref 4.0–10.5)

## 2016-01-16 LAB — ETHANOL

## 2016-01-16 LAB — SALICYLATE LEVEL

## 2016-01-16 LAB — ACETAMINOPHEN LEVEL: Acetaminophen (Tylenol), Serum: <10 ug/mL — ABNORMAL LOW (ref 10–30)

## 2016-01-16 MED ORDER — THIAMINE HCL 100 MG/ML IJ SOLN: 100.0000 mg | Freq: Every day | INTRAMUSCULAR | Status: DC

## 2016-01-16 MED ORDER — LORAZEPAM 1 MG PO TABS
0.0000 mg | ORAL_TABLET | Freq: Four times a day (QID) | ORAL | Status: AC
  Administered 2016-01-18: 1 mg via ORAL

## 2016-01-16 MED ORDER — VITAMIN B-1 100 MG PO TABS
100.0000 mg | ORAL_TABLET | Freq: Every day | ORAL | Status: DC
  Administered 2016-01-17 – 2016-01-18 (×2): 100 mg via ORAL
  Filled 2016-01-16 (×2): qty 1

## 2016-01-16 MED ORDER — LORAZEPAM 2 MG/ML IJ SOLN: 0.0000 mg | Freq: Four times a day (QID) | INTRAMUSCULAR | Status: DC

## 2016-01-16 MED ORDER — LORAZEPAM 2 MG/ML IJ SOLN: 0.0000 mg | Freq: Two times a day (BID) | INTRAMUSCULAR | Status: DC

## 2016-01-16 MED ORDER — LORAZEPAM 1 MG PO TABS
0.0000 mg | ORAL_TABLET | Freq: Two times a day (BID) | ORAL | Status: DC
  Administered 2016-01-17: 1 mg via ORAL
  Filled 2016-01-16 (×2): qty 1

## 2016-01-16 NOTE — ED Notes (Signed)
Pt comes to Ed via POV, confused and disoriented. Reported drug overdose of xanax.

## 2016-01-16 NOTE — ED Notes (Signed)
Spoke with poison control, reported cases and assessment/ case findings, currently monitoring and will advise. Ekg ordered Q hourly per provider.

## 2016-01-16 NOTE — ED Notes (Signed)
Pt arrives to the ER via mother; pt states "I'm sick"; pt has been posting on [employer] page that she is dying; pt states that she wants her boyfriend to think she is dying; pt states that she took a "bag full" of pills; pt states that the pills were Xanax; pt awake and responds to verbal stimuli; pt agitated upon arousal; pt able to state name but unable to give DOB; pt with a history of Bipolar and Schizophrenia and is noncompliant with medications

## 2016-01-16 NOTE — ED Provider Notes (Signed)
CSN: [id number]     Arrival date & time 01/16/16  2132 History   First MD Initiated Contact with Patient 01/16/16 2147     Chief Complaint  Patient presents with  . Drug Overdose     (Consider location/radiation/quality/duration/timing/severity/associated sxs/prior Treatment) HPI Comments: Patient is a 25 year old female, she does have a history of schizophrenia and depression, she comes to the hospital at the request of her mother who called paramedics stating that the patient had overdosed on her medications. According to the mother the patient and was to be taking Abilify and another antidepressant Wellbutrin, she has not been taking these medications for a long time, she does not live anymore specific but lives from couch to couch according to the mother. She also reports that she has had several episodes recently of volatile behavior including this evening making comments on Facebook saying that she had died on her mother's Facebook page, she states she wanted to see how many people cared, she doesn't endorse taking an overdose of medication thought to be Xanax though it is unclear and she has since denied that allegation. The patient is not cooperative with history  Patient is a 25 y.o. female presenting with Overdose. The history is provided by the patient.  Drug Overdose    Past Medical History  Diagnosis Date  . Shingles   . Trichomonas vaginitis   . Schizophrenia (HCC)   . Bipolar 1 disorder [facility])    Past Surgical History  Procedure Laterality Date  . Knee surgery    . Knee surgery     Family History  Problem Relation Age of Onset  . Diabetes Paternal Grandmother   . Diabetes Paternal Grandfather    Social History  Substance Use Topics  . Smoking status: Current Every Day Smoker -- 0.50 packs/day    Types: Cigarettes  . Smokeless tobacco: Never Used  . Alcohol Use: Yes   OB History    Gravida Para Term Preterm AB TAB SAB Ectopic Multiple Living   0 1   3      Review of Systems  All other systems reviewed and are negative.     Allergies  Review of patient's allergies indicates no known allergies.  Home Medications   Prior to Admission medications   Medication Sig Start Date End Date Taking? Authorizing Provider  clindamycin (CLEOCIN) 150 MG capsule Take 2 capsules (300 mg total) by mouth 4 (four) times daily. 10/01/15   Laurence Spates, MD  etonogestrel-ethinyl estradiol (NUVARING) 0.12-0.015 MG/24HR vaginal ring Insert vaginally and leave in place for 3 consecutive weeks, then remove for 1 week. Patient not taking: Reported on 10/01/2015 05/26/15   Duane Lope, NP  ibuprofen (ADVIL,MOTRIN) 200 MG tablet Take 600 mg by mouth every 6 (six) hours as needed for fever, headache, mild pain, moderate pain or cramping.     Historical Provider, MD  naproxen (NAPROSYN) 500 MG tablet Take 1 tablet (500 mg total) by mouth 2 (two) times daily. Patient not taking: Reported on 05/13/2015 05/06/15   Antony Madura, PA-C   BP 102/62 mmHg  Pulse 64  Temp(Src) 97.1 F (36.2 C) (Oral)  Resp 18  SpO2 98%  LMP 12/27/2015 Physical Exam  Constitutional: She appears well-developed and well-nourished. She appears distressed.  HENT:  Head: Normocephalic and atraumatic.  Mouth/Throat: Oropharynx is clear and moist. No oropharyngeal exudate.  Eyes: Conjunctivae and EOM are normal. Pupils are equal, round, and reactive to light. Right eye exhibits  no discharge. Left eye exhibits no discharge. No scleral icterus.  Neck: Normal range of motion. Neck supple. No JVD present. No thyromegaly present.  Cardiovascular: Normal rate, regular rhythm, normal heart sounds and intact distal pulses.  Exam reveals no gallop and no friction rub.   No murmur heard. Pulmonary/Chest: Effort normal and breath sounds normal. No respiratory distress. She has no wheezes. She has no rales.  Abdominal: Soft. Bowel sounds are normal. She exhibits no distension and no mass. There is  no tenderness.  Musculoskeletal: Normal range of motion. She exhibits no edema or tenderness.  Lymphadenopathy:    She has no cervical adenopathy.  Neurological:  The patient is sleepy but arousable, no slurring of speech but quickly becomes somnolent when not engaged in discussion, moves all extremities 4, appears to have normal strength, she can reposition of the bed without difficulty  Skin: Skin is warm and dry. No rash noted. No erythema.  Evolving bruises of the right upper extremity over the upper arm, also has a small bruise on the leg numbers as to the back chest or the abdomen  Psychiatric:  The patient is mildly agitated, she appears anxious, she denies overdose, does not appear to be responding to internal stimuli  Nursing note and vitals reviewed.   ED Course  Procedures (including critical care time) Labs Review Labs Reviewed  COMPREHENSIVE METABOLIC PANEL - Abnormal; Notable for the following:    Glucose, Bld 113 (*)    All other components within normal limits  ACETAMINOPHEN LEVEL - Abnormal; Notable for the following:    Acetaminophen (Tylenol), Serum <10 (*)    All other components within normal limits  CBC - Abnormal; Notable for the following:    Hemoglobin 11.9 (*)    RDW 15.8 (*)    All other components within normal limits  ETHANOL  SALICYLATE LEVEL  URINE RAPID DRUG SCREEN, HOSP PERFORMED  I-STAT BETA HCG BLOOD, ED ([facility], [facility], [facility] ONLY)    Imaging Review No results found. I have personally reviewed and evaluated these images and lab results as part of my medical decision-making.   EKG Interpretation   Date/Time:  Sunday January 16 2016 21:34:44 EST Ventricular Rate:  92 PR Interval:  134 QRS Duration: 84 QT Interval:  333 QTC Calculation: 412 R Axis:   113 Text Interpretation:  Normal sinus rhythm Normal ECG since last tracing no  significant change Confirmed by Riko Lumsden  MD, Chistopher Mangino ([id number]) on 01/16/2016  9:55:19 PM      EKG  Interpretation  Date/Time:  Sunday January 16 2016 22:57:22 EST Ventricular Rate:  67 PR Interval:  142 QRS Duration: 83 QT Interval:  391 QTC Calculation: 413 R Axis:   99 Text Interpretation:  Right and left arm electrode reversal, interpretation assumes no reversal Sinus rhythm Borderline right axis deviation Nonspecific T abnrm, anterolateral leads Since last tracing T wave abnormality more prominent Abnormal ekg Confirmed by Kiarrah Rausch  MD, Saige Canton ([id number]) on 01/17/2016 12:03:27 AM        MDM   Final diagnoses:  None    The patient is likely taken an overdose given her level of alertness, she will need evaluation with labs, Tylenol and salicylate coingestion levels, cardiac and neurologic monitoring, anticipate psychiatric evaluation for admission.  Change of shift - care singed out to Dr. Patria Mane to f/u Psych recommendations which have not been given yet as of 12 AM.  Eber Hong, MD 01/17/16 0003
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
## 2016-01-16 NOTE — ED Notes (Signed)
Pt states to writer that she does not want her blood drawn.  Environmental manager

## 2016-01-17 DIAGNOSIS — F250 Schizoaffective disorder, bipolar type: Secondary | ICD-10-CM | POA: Diagnosis present

## 2016-01-17 DIAGNOSIS — R45851 Suicidal ideations: Secondary | ICD-10-CM

## 2016-01-17 DIAGNOSIS — T50902A Poisoning by unspecified drugs, medicaments and biological substances, intentional self-harm, initial encounter: Secondary | ICD-10-CM | POA: Diagnosis present

## 2016-01-17 LAB — RAPID URINE DRUG SCREEN, HOSP PERFORMED
AMPHETAMINES: NOT DETECTED
BARBITURATES: NOT DETECTED
Benzodiazepines: POSITIVE — AB
COCAINE: NOT DETECTED
OPIATES: NOT DETECTED
TETRAHYDROCANNABINOL: POSITIVE — AB

## 2016-01-17 LAB — PREGNANCY, URINE: Preg Test, Ur: NEGATIVE

## 2016-01-17 LAB — I-STAT BETA HCG BLOOD, ED (MC, WL, AP ONLY): I-stat hCG, quantitative: <5 m[IU]/mL (ref <5)

## 2016-01-17 MED ORDER — SODIUM CHLORIDE 0.9 % IV BOLUS (SEPSIS)
1000.0000 mL | Freq: Once | INTRAVENOUS | Status: AC
  Administered 2016-01-17: 1000 mL via INTRAVENOUS

## 2016-01-17 MED ORDER — ZIPRASIDONE MESYLATE 20 MG IM SOLR
INTRAMUSCULAR | Status: AC
  Administered 2016-01-17: 02:00:00
  Filled 2016-01-17: qty 20

## 2016-01-17 MED ORDER — STERILE WATER FOR INJECTION IJ SOLN
INTRAMUSCULAR | Status: AC
Start: 2016-01-17 — End: 2016-01-17
  Filled 2016-01-17: qty 10

## 2016-01-17 MED ORDER — ZIPRASIDONE MESYLATE 20 MG IM SOLR
20.0000 mg | Freq: Once | INTRAMUSCULAR | Status: AC
  Administered 2016-01-17: 20 mg via INTRAMUSCULAR
  Filled 2016-01-17: qty 20

## 2016-01-17 MED ORDER — STERILE WATER FOR INJECTION IJ SOLN
INTRAMUSCULAR | Status: AC
  Administered 2016-01-17: 2 mL via INTRAMUSCULAR
  Filled 2016-01-17: qty 10

## 2016-01-17 MED ORDER — SODIUM CHLORIDE 0.9 % IV BOLUS (SEPSIS)
1000.0000 mL | Freq: Once | INTRAVENOUS | Status: AC
Start: 2016-01-17 — End: 2016-01-17
  Administered 2016-01-17: 1000 mL via INTRAVENOUS

## 2016-01-17 NOTE — ED Notes (Signed)
Pt refuses assessment by nursing staff at present time; pt will not answer questions and states "I do not want to do it." Will attempt assessment at later time.

## 2016-01-17 NOTE — ED Notes (Signed)
Pt sleeping at present, no distress noted, calm & cooperative, denies SI at present, no AVH.  Monitoring for safety, Q 15 min checks in effect.

## 2016-01-17 NOTE — ED Notes (Signed)
KAREN GORDON/MOTHER, [phone].

## 2016-01-17 NOTE — ED Notes (Signed)
Pt is awake and becoming agitated while speaking on the telephone. Sitter at bedside states pt continues to refuse to change into paper scrubs as per policy. Dr. Broadus John at bedside for reevaluation as pt is becoming increasingly agitated and uncooperative. Pt is yelling and cussing at staff.

## 2016-01-17 NOTE — Consult Note (Signed)
[place] [facility] Consult   Reason for Consult:  Overdose, intentional Referring Physician:  EDP Patient Identification: Stacy Peterson MRN:  [id number] Principal Diagnosis: Schizoaffective disorder, bipolar type [facility]) Diagnosis:   Patient Active Problem List   Diagnosis Date Noted  . Schizoaffective disorder, bipolar type ([place]) [F25.0] 01/17/2016    Priority: High  . Drug overdose, intentional ([place]) [T50.902A] 01/17/2016    Priority: High  . Active labor [O80, Z37.9] 06/24/2014  . Late prenatal care affecting pregnancy in third trimester, antepartum [O09.33] 06/08/2014  . 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 Rubella non-immune status, antepartum [O99.89, Z28.3] 06/08/2014  . Left-sided low back pain with sciatica [M54.42] 04/30/2014  . Second trimester pregnancy [Z33.1] 04/30/2014  . Pyelonephritis, acute [N10] 09/09/2013  . Dehydration, moderate [E86.0] 09/09/2013  . Bacterial vaginitis [N76.0, A49.9] 09/09/2013    Total Time spent with patient: 45 minutes  Subjective:   Stacy Peterson is a 25 y.o. female patient admitted with suicide attempt.  HPI:  Patient was drowsy on assessment but stated "I hate my life, my girlfriend is dead."  She does not want to be here (in this life).  Stacy Peterson took an intentional overdose of Xanax and cannot remember how many she took.  History of schizoaffective disorder.  Irritable on exam.  Past Psychiatric History: schizoaffective disorder  Risk to Self: Suicidal Ideation: Yes-Currently Present Suicidal Intent: No Is patient at risk for suicide?: Yes Suicidal Plan?: No Specify Current Suicidal Plan: Pt denies plan but just overdosed on a "bag full" of xanax Access to Means: Yes Specify Access to Suicidal Means: Access to medications What has been your use of drugs/alcohol within the last 12 months?: Etoh use 5x per week, per pt How many times?: 2 Other Self Harm Risks: SA Triggers for Past Attempts: Unknown Intentional Self  Injurious Behavior: None Risk to Others: Homicidal Ideation: No Thoughts of Harm to Others: No Current Homicidal Intent: No Current Homicidal Plan: No Access to Homicidal Means: No Identified Victim: na History of harm to others?: Yes Assessment of Violence: In past 6-12 months Violent Behavior Description: Pt verbally aggressive, volatile, per mom. Unsure if there is physical aggression. Does patient have access to weapons?: No Criminal Charges Pending?: No Does patient have a court date: No Prior Inpatient Therapy: Prior Inpatient Therapy: Yes Prior Therapy Dates: 2007 Prior Therapy Facilty/Provider(s): [facility] Reason for Treatment: Aggression, SI/HI Prior Outpatient Therapy: Prior Outpatient Therapy: Yes Prior Therapy Dates: Ongoing since teens (per pt) Prior Therapy Facilty/Provider(s): [place] Reason for Treatment: Anger management problems Does patient have an ACCT team?: No Does patient have Intensive In-House Services?  : No Does patient have [facility] services? : No Does patient have P4CC services?: No  Past Medical History:  Past Medical History  Diagnosis Date  . Shingles   . Trichomonas vaginitis   . Schizophrenia ([place])   . Bipolar 1 disorder [facility])     Past Surgical History  Procedure Laterality Date  . Knee surgery    . Knee surgery     Family History:  Family History  Problem Relation Age of Onset  . Diabetes Paternal Grandmother   . Diabetes Paternal Grandfather    Family Psychiatric  History: none Social History:  History  Alcohol Use  . Yes     History  Drug Use  . Yes    Comment: marijuana inearly pregnancy    Social History   Social History  . Marital Status: Single    Spouse Name: N/A  .  Number of Children: N/A  . Years of Education: N/A   Social History Main Topics  . Smoking status: Current Every Day Smoker -- 0.50 packs/day    Types: Cigarettes  . Smokeless tobacco: Never Used  . Alcohol Use: Yes  . Drug Use: Yes     Comment:  marijuana inearly pregnancy  . Sexual Activity: Yes    Birth Control/ Protection: None   Other Topics Concern  . None   Social History Narrative   Additional Social History:    Pain Medications: See PTA med list Prescriptions: See PTA med list Over the Counter: See PTA med list History of alcohol / drug use?: Yes Longest period of sobriety (when/how long): UTA Negative Consequences of Use: Financial, Personal relationships, Legal, Work / School Name of Substance 1: Etoh 1 - Age of First Use: teens 1 - Amount (size/oz): "as much as I can" 1 - Frequency: 5x per week 1 - Duration: Ongoing 1 - Last Use / Amount: "Yesterday", 01/15/16                   Allergies:  No Known Allergies  Labs:  Results for orders placed or performed during the hospital encounter of 01/16/16 (from the past 48 hour(s))  Comprehensive metabolic panel     Status: Abnormal   Collection Time: 01/16/16 10:01 PM  Result Value Ref Range   Sodium 140 135 - 145 mmol/L   Potassium 3.8 3.5 - 5.1 mmol/L   Chloride 106 101 - 111 mmol/L   CO2 23 22 - 32 mmol/L   Glucose, Bld 113 (H) 65 - 99 mg/dL   BUN 13 6 - 20 mg/dL   Creatinine, Ser 0.77 0.44 - 1.00 mg/dL   Calcium 9.6 8.9 - 10.3 mg/dL   Total Protein 7.9 6.5 - 8.1 g/dL   Albumin 4.6 3.5 - 5.0 g/dL   AST 18 15 - 41 U/L   ALT 14 14 - 54 U/L   Alkaline Phosphatase 56 38 - 126 U/L   Total Bilirubin 0.9 0.3 - 1.2 mg/dL   GFR calc non Af Amer >60 >60 mL/min   GFR calc Af Amer >60 >60 mL/min    Comment: (NOTE) The eGFR has been calculated using the CKD EPI equation. This calculation has not been validated in all clinical situations. eGFR's persistently <60 mL/min signify possible Chronic Kidney Disease.    Anion gap 11 5 - 15  Ethanol (ETOH)     Status: None   Collection Time: 01/16/16 10:01 PM  Result Value Ref Range   Alcohol, Ethyl (B) <5 <5 mg/dL    Comment:        LOWEST DETECTABLE LIMIT FOR SERUM ALCOHOL IS 5 mg/dL FOR MEDICAL PURPOSES  ONLY   Salicylate level     Status: None   Collection Time: 01/16/16 10:01 PM  Result Value Ref Range   Salicylate Lvl <1.9 2.8 - 30.0 mg/dL  Acetaminophen level     Status: Abnormal   Collection Time: 01/16/16 10:01 PM  Result Value Ref Range   Acetaminophen (Tylenol), Serum <10 (L) 10 - 30 ug/mL    Comment:        THERAPEUTIC CONCENTRATIONS VARY SIGNIFICANTLY. A RANGE OF 10-30 ug/mL MAY BE AN EFFECTIVE CONCENTRATION FOR MANY PATIENTS. HOWEVER, SOME ARE BEST TREATED AT CONCENTRATIONS OUTSIDE THIS RANGE. ACETAMINOPHEN CONCENTRATIONS >150 ug/mL AT 4 HOURS AFTER INGESTION AND >50 ug/mL AT 12 HOURS AFTER INGESTION ARE OFTEN ASSOCIATED WITH TOXIC REACTIONS.   CBC  Status: Abnormal   Collection Time: 01/16/16 10:01 PM  Result Value Ref Range   WBC 6.0 4.0 - 10.5 K/uL   RBC 4.25 3.87 - 5.11 MIL/uL   Hemoglobin 11.9 (L) 12.0 - 15.0 g/dL   HCT 37.1 36.0 - 46.0 %   MCV 87.3 78.0 - 100.0 fL   MCH 28.0 26.0 - 34.0 pg   MCHC 32.1 30.0 - 36.0 g/dL   RDW 15.8 (H) 11.5 - 15.5 %   Platelets 281 150 - 400 K/uL  I-Stat beta hCG blood, ED ([facility], [facility], [facility] only)     Status: None   Collection Time: 01/17/16 12:03 AM  Result Value Ref Range   I-stat hCG, quantitative <5.0 <5 mIU/mL   Comment 3            Comment:   GEST. AGE      CONC.  (mIU/mL)   <=1 WEEK        5 - 50     2 WEEKS       50 - 500     3 WEEKS       100 - 10,000     4 WEEKS     1,000 - 30,000        FEMALE AND NON-PREGNANT FEMALE:     LESS THAN 5 mIU/mL   Urine rapid drug screen (hosp performed) (Not at [facility])     Status: Abnormal   Collection Time: 01/17/16  2:33 AM  Result Value Ref Range   Opiates NONE DETECTED NONE DETECTED   Cocaine NONE DETECTED NONE DETECTED   Benzodiazepines POSITIVE (A) NONE DETECTED   Amphetamines NONE DETECTED NONE DETECTED   Tetrahydrocannabinol POSITIVE (A) NONE DETECTED   Barbiturates NONE DETECTED NONE DETECTED    Comment:        DRUG SCREEN FOR MEDICAL PURPOSES ONLY.  IF CONFIRMATION  IS NEEDED FOR ANY PURPOSE, NOTIFY LAB WITHIN 5 DAYS.        LOWEST DETECTABLE LIMITS FOR URINE DRUG SCREEN Drug Class       Cutoff (ng/mL) Amphetamine      1000 Barbiturate      200 Benzodiazepine   662 Tricyclics       947 Opiates          300 Cocaine          300 THC              50   Pregnancy, urine     Status: None   Collection Time: 01/17/16  2:33 AM  Result Value Ref Range   Preg Test, Ur NEGATIVE NEGATIVE    Comment:        THE SENSITIVITY OF THIS METHODOLOGY IS >20 mIU/mL.     Current Facility-Administered Medications  Medication Dose Route Frequency Provider Last Rate Last Dose  . LORazepam (ATIVAN) injection 0-4 mg  0-4 mg Intravenous 4 times per day Noemi Chapel, MD   0 mg at 01/17/16 0315  . LORazepam (ATIVAN) injection 0-4 mg  0-4 mg Intravenous Q12H Noemi Chapel, MD   0 mg at 01/17/16 0314  . LORazepam (ATIVAN) tablet 0-4 mg  0-4 mg Oral 4 times per day Noemi Chapel, MD   Stopped at 01/17/16 1210  . LORazepam (ATIVAN) tablet 0-4 mg  0-4 mg Oral Q12H Noemi Chapel, MD   0 mg at 01/17/16 0100  . sterile water (preservative free) injection           . thiamine (B-1) injection 100 mg  100 mg Intravenous Daily Noemi Chapel, MD   100 mg at 01/17/16 1017  . thiamine (VITAMIN B-1) tablet 100 mg  100 mg Oral Daily Noemi Chapel, MD   100 mg at 01/17/16 1443   Current Outpatient Prescriptions  Medication Sig Dispense Refill  . clindamycin (CLEOCIN) 150 MG capsule Take 2 capsules (300 mg total) by mouth 4 (four) times daily. 56 capsule 0  . etonogestrel-ethinyl estradiol (NUVARING) 0.12-0.015 MG/24HR vaginal ring Insert vaginally and leave in place for 3 consecutive weeks, then remove for 1 week. (Patient not taking: Reported on 10/01/2015) 1 each 3  . ibuprofen (ADVIL,MOTRIN) 200 MG tablet Take 600 mg by mouth every 6 (six) hours as needed for fever, headache, mild pain, moderate pain or cramping.     . naproxen (NAPROSYN) 500 MG tablet Take 1 tablet (500 mg total) by mouth  2 (two) times daily. (Patient not taking: Reported on 05/13/2015) 30 tablet 0    Musculoskeletal: Strength & Muscle Tone: decreased Gait & Station: unsteady Patient leans: N/A  Psychiatric Specialty Exam: Review of Systems  Constitutional: Negative.   HENT: Negative.   Eyes: Negative.   Respiratory: Negative.   Cardiovascular: Negative.   Gastrointestinal: Negative.   Genitourinary: Negative.   Musculoskeletal: Negative.   Skin: Negative.   Neurological: Negative.   Endo/Heme/Allergies: Negative.   Psychiatric/Behavioral: Positive for depression and suicidal ideas.    Blood pressure 93/55, pulse 51, temperature 98 F (36.7 C), temperature source Oral, resp. rate 14, last menstrual period 12/27/2015, SpO2 100 %, unknown if currently breastfeeding.There is no weight on file to calculate BMI.  General Appearance: Disheveled  Eye Sport and exercise psychologist::  Fair  Speech:  Normal Rate  Volume:  Decreased  Mood:  Depressed and Irritable  Affect:  Blunt  Thought Process:  Coherent  Orientation:  Full (Time, Place, and Person)  Thought Content:  Delusions and Rumination  Suicidal Thoughts:  Yes.  with intent/plan  Homicidal Thoughts:  No  Memory:  Immediate;   Fair Recent;   Fair Remote;   Fair  Judgement:  Impaired  Insight:  Lacking  Psychomotor Activity:  Decreased  Concentration:  Fair  Recall:  [employer] of Knowledge:Fair  Language: Good  Akathisia:  No  Handed:  Right  AIMS (if indicated):     Assets:  Housing Leisure Time Physical Health Resilience Social Support  ADL's:  Intact  Cognition: Impaired,  Mild  Sleep:      Treatment Plan Summary: Daily contact with patient to assess and evaluate symptoms and progress in treatment, Medication management and Plan schizoaffective disorder, bipolar type:  -Crisis stabilization -Medication management:  Medical management with fluids and stabilization, Ativan detox protocol in place -Individual counseling  Disposition: Recommend  psychiatric Inpatient admission when medically cleared.  Waylan Boga, [place] 01/17/2016 2:14 PM Patient seen face-to-face for psychiatric evaluation, chart reviewed and case discussed with the physician extender and developed treatment plan. Reviewed the information documented and agree with the treatment plan. Corena Pilgrim, MD

## 2016-01-17 NOTE — ED Provider Notes (Signed)
2:01 AM Pt became agitated and was difficult to redirect. 20 mg IM geodon given. ivfs given  Prior. Ambulatory to the bathroom at this time. Cleared medically. Transfer to the psych ER  Azalia Bilis, MD 01/17/16 [phone]

## 2016-01-17 NOTE — ED Notes (Signed)
Bed: WA09 Expected date:  Expected time:  Means of arrival:  Comments: For RES b

## 2016-01-17 NOTE — ED Notes (Signed)
Patient angry on admission to the unit.  Stated she did not see a doctor today and is demanding to leave.  I informed patient she was on an IVC and gave her a copy of the paperwork.  I also spoke with Molli Knock who stated that she, Kendell Bane and Dr. Mervyn Skeeters all went to her room this morning and she told them to leave.  When I told her she would have to stay until morning she took her paperwork and went down to her room.

## 2016-01-17 NOTE — ED Notes (Signed)
Charge Rn - [employer] notified of need for sitter.

## 2016-01-17 NOTE — BHH Counselor (Signed)
01/17/16 Referral sent to [place], [place], [place], [place], [place], [place], [place], [place], [place], [place], [place], [place], [place], and [place] K. Jesus Genera, [facility]  Counselor 01/17/2016 6:19 AM

## 2016-01-17 NOTE — BH Assessment (Signed)
BHH Assessment Progress Note  The following facilities have been contacted to seek placement for this pt, with results as noted:  Beds available, information sent, decision pending:  [facility]   At capacity:  [place] [facility]   Elenora Hawbaker, MA Triage Specialist [phone]     

## 2016-01-17 NOTE — BHH Counselor (Signed)
Psych disposition: Per Ijeoma Nwaeze, NP, Pt meets inpt tx criteria. No appropriate (50[facility] Fessds at [facility] tonight. TTS to seek placement elsewhere in the meantime.  Counselor informed Dr Patria Mane of disposition.   - Cyndie Mull, [facility]   Therapeutic Triage   [facility]

## 2016-01-18 DIAGNOSIS — T50902A Poisoning by unspecified drugs, medicaments and biological substances, intentional self-harm, initial encounter: Secondary | ICD-10-CM | POA: Insufficient documentation

## 2016-01-18 DIAGNOSIS — F250 Schizoaffective disorder, bipolar type: Secondary | ICD-10-CM

## 2016-01-18 MED ORDER — DIPHENHYDRAMINE HCL 50 MG/ML IJ SOLN
25.0000 mg | Freq: Once | INTRAMUSCULAR | Status: DC
Start: 2016-01-18 — End: 2016-01-19

## 2016-01-18 MED ORDER — OXCARBAZEPINE 300 MG PO TABS
300.0000 mg | ORAL_TABLET | Freq: Two times a day (BID) | ORAL | Status: DC
  Administered 2016-01-18 – 2016-01-19 (×3): 300 mg via ORAL
  Filled 2016-01-18 (×3): qty 1

## 2016-01-18 MED ORDER — DIPHENHYDRAMINE HCL 50 MG/ML IJ SOLN: 25.0000 mg | Freq: Once | INTRAMUSCULAR | Status: DC

## 2016-01-18 MED ORDER — OLANZAPINE 10 MG PO TBDP
10.0000 mg | ORAL_TABLET | Freq: Three times a day (TID) | ORAL | Status: DC | PRN
  Administered 2016-01-18 – 2016-01-19 (×2): 10 mg via ORAL
  Filled 2016-01-18 (×2): qty 1

## 2016-01-18 MED ORDER — OLANZAPINE 2.5 MG PO TABS
2.5000 mg | ORAL_TABLET | Freq: Two times a day (BID) | ORAL | Status: DC
  Administered 2016-01-18 – 2016-01-19 (×3): 2.5 mg via ORAL
  Filled 2016-01-18 (×3): qty 1

## 2016-01-18 MED ORDER — DIPHENHYDRAMINE HCL 25 MG PO CAPS
25.0000 mg | ORAL_CAPSULE | Freq: Once | ORAL | Status: DC
  Filled 2016-01-18: qty 1

## 2016-01-18 MED ORDER — ZIPRASIDONE MESYLATE 20 MG IM SOLR: 10.0000 mg | Freq: Once | INTRAMUSCULAR | Status: DC

## 2016-01-18 NOTE — ED Notes (Signed)
Pt has been agitated cursing on the unit and threatened to kill her mother and sister while yelling on the phone. Pt turned over the chair in the room and continued to increase in anger. Attempted verbal de escalation with pt and received orders for medication from MD. Pt took po Zyprexa prn. She refused benadryl saying that it would make her sleepy. NP notified of pt's refusal of benadryl. Safety maintained on the unit.
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
## 2016-01-18 NOTE — BH Assessment (Signed)
BHH Assessment Progress Note  The following facilities have been contacted to seek placement for this pt, with results as noted:  Beds available, information sent, decision pending:  [place] [place] [facility] [place]   At capacity:  [facility]   Stacy Pauwels, MA Triage Specialist [phone]     

## 2016-01-19 ENCOUNTER — Inpatient Hospital Stay
Admission: EM | Admit: 2016-01-19 | Discharge: 2016-01-20 | DRG: 885 | Disposition: A | Discharge status: Home / Self Care | Payer: No Typology Code available for payment source | Source: Intra-hospital | Attending: Psychiatry | Admitting: Psychiatry

## 2016-01-19 ENCOUNTER — Encounter: Payer: Self-pay | Admitting: Psychiatry

## 2016-01-19 DIAGNOSIS — N760 Acute vaginitis: Secondary | ICD-10-CM | POA: Diagnosis present

## 2016-01-19 DIAGNOSIS — F429 Obsessive-compulsive disorder, unspecified: Secondary | ICD-10-CM | POA: Diagnosis present

## 2016-01-19 DIAGNOSIS — Z833 Family history of diabetes mellitus: Secondary | ICD-10-CM | POA: Diagnosis not present

## 2016-01-19 DIAGNOSIS — F17210 Nicotine dependence, cigarettes, uncomplicated: Secondary | ICD-10-CM | POA: Diagnosis present

## 2016-01-19 DIAGNOSIS — F250 Schizoaffective disorder, bipolar type: Secondary | ICD-10-CM | POA: Diagnosis present

## 2016-01-19 DIAGNOSIS — T424X2A Poisoning by benzodiazepines, intentional self-harm, initial encounter: Secondary | ICD-10-CM | POA: Diagnosis present

## 2016-01-19 DIAGNOSIS — Z9889 Other specified postprocedural states: Secondary | ICD-10-CM | POA: Diagnosis not present

## 2016-01-19 DIAGNOSIS — R45851 Suicidal ideations: Secondary | ICD-10-CM | POA: Diagnosis present

## 2016-01-19 DIAGNOSIS — F1290 Cannabis use, unspecified, uncomplicated: Secondary | ICD-10-CM | POA: Diagnosis present

## 2016-01-19 MED ORDER — ACETAMINOPHEN 325 MG PO TABS: 650.0000 mg | ORAL_TABLET | Freq: Four times a day (QID) | ORAL | Status: DC | PRN

## 2016-01-19 MED ORDER — OLANZAPINE 10 MG PO TABS
10.0000 mg | ORAL_TABLET | Freq: Every day | ORAL | Status: DC
  Administered 2016-01-19: 10 mg via ORAL
  Filled 2016-01-19: qty 1

## 2016-01-19 MED ORDER — OXCARBAZEPINE 300 MG PO TABS
300.0000 mg | ORAL_TABLET | Freq: Two times a day (BID) | ORAL | Status: DC
  Administered 2016-01-19: 300 mg via ORAL
  Filled 2016-01-19 (×4): qty 1

## 2016-01-19 MED ORDER — ALUM & MAG HYDROXIDE-SIMETH 200-200-20 MG/5ML PO SUSP: 30.0000 mL | ORAL | Status: DC | PRN

## 2016-01-19 MED ORDER — VITAMIN B-1 100 MG PO TABS
100.0000 mg | ORAL_TABLET | Freq: Every day | ORAL | Status: DC
  Administered 2016-01-19: 100 mg via ORAL
  Filled 2016-01-19: qty 1

## 2016-01-19 MED ORDER — NICOTINE 21 MG/24HR TD PT24: 21.0000 mg | MEDICATED_PATCH | Freq: Every day | TRANSDERMAL | Status: DC

## 2016-01-19 MED ORDER — MAGNESIUM HYDROXIDE 400 MG/5ML PO SUSP: 30.0000 mL | Freq: Every day | ORAL | Status: DC | PRN

## 2016-01-19 MED ORDER — CLINDAMYCIN HCL 150 MG PO CAPS
300.0000 mg | ORAL_CAPSULE | Freq: Four times a day (QID) | ORAL | Status: DC
  Administered 2016-01-20 (×2): 300 mg via ORAL
  Filled 2016-01-19 (×2): qty 2

## 2016-01-19 MED ORDER — TRAZODONE HCL 100 MG PO TABS: 100.0000 mg | ORAL_TABLET | Freq: Every evening | ORAL | Status: DC | PRN

## 2016-01-19 MED ORDER — THIAMINE HCL 100 MG/ML IJ SOLN
100.0000 mg | Freq: Every day | INTRAMUSCULAR | Status: DC
Start: 2016-01-19 — End: 2016-01-19

## 2016-01-19 NOTE — ED Notes (Signed)
Report called to Victorino Dike, Charity fundraiser at [employer] called for transport. Will continue to monitor.

## 2016-01-19 NOTE — ED Notes (Addendum)
Patient denies SI/HI. Denies AVH. Pt denies s/s of withdrawal. Pleasant on approach, Behavior cooperative , but anxious. Remains on special checks q 15 mins for safety. Will continue to monitor.

## 2016-01-19 NOTE — ED Notes (Signed)
Patient transported to [facility], by [place] [employer], for continuation of specialized care. Belongings signed for and given to deputy. Pt left in no acute distress. 

## 2016-01-19 NOTE — ED Notes (Signed)
Pt visitor at bedside.
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
## 2016-01-19 NOTE — BH Assessment (Signed)
Patient has been accepted to [facility].  Accepting physician is Dr. Jennet Maduro.  Attending Physician will be Dr. Jennet Maduro.  Patient has been assigned to room 319-A, by [facility] Charge Nurse Victorino Dike.  Call report to [phone].  Representative/Transfer Coordinator is Nehemias Sauceda.  [facility] ER Staff Jessie Foot, TTS) made aware of acceptance.

## 2016-01-19 NOTE — ED Notes (Signed)
Patient calm and cooperative at this time. Patient and nurse enter into a therapeutic discussion. Encouragement and support provided and safety maintain. Q 15 min safety checks remain in place.

## 2016-01-20 DIAGNOSIS — F250 Schizoaffective disorder, bipolar type: Principal | ICD-10-CM

## 2016-01-20 MED ORDER — CLINDAMYCIN HCL 150 MG PO CAPS: 300.0000 mg | ORAL_CAPSULE | Freq: Four times a day (QID) | ORAL | Status: DC

## 2016-01-20 NOTE — BHH Suicide Risk Assessment (Signed)
[facility] Admission Suicide Risk Assessment   Nursing information obtained from:    Demographic factors:    Current Mental Status:    Loss Factors:    Historical Factors:    Risk Reduction Factors:     Total Time spent with patient: 1 hour Principal Problem: Schizoaffective disorder, bipolar type (HCC) Diagnosis:   Patient Active Problem List   Diagnosis Date Noted  . Intentional overdose of drug in tablet form (HCC) [T50.902A]   . Schizoaffective disorder, bipolar type (HCC) [F25.0] 01/17/2016  . Drug overdose, intentional (HCC) [T50.902A] 01/17/2016  . 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 Left-sided low back pain with sciatica [M54.42] 04/30/2014   Subjective Data: The patient denies any symptoms of depression, anxiety, or psychosis. She is not suicidal or homicidal.  Continued Clinical Symptoms:  Alcohol Use Disorder Identification Test Final Score (AUDIT): 5 The "Alcohol Use Disorders Identification Test", Guidelines for Use in Primary Care, Second Edition.  World Science writer [facility]). Score between 0-7:  no or low risk or alcohol related problems. Score between 8-15:  moderate risk of alcohol related problems. Score between 16-19:  high risk of alcohol related problems. Score 20 or above:  warrants further diagnostic evaluation for alcohol dependence and treatment.   CLINICAL FACTORS:   Depression:   Comorbid alcohol abuse/dependence Impulsivity Alcohol/Substance Abuse/Dependencies   Musculoskeletal: Strength & Muscle Tone: within normal limits Gait & Station: normal Patient leans: N/A  Psychiatric Specialty Exam: Review of Systems  All other systems reviewed and are negative.   Blood pressure 111/71, pulse 70, temperature 98.5 F (36.9 C), temperature source Oral, resp. rate 18, height  (1.626 m), weight 56.246 kg (124 lb), last menstrual period 12/27/2015, SpO2 100 %, unknown if currently breastfeeding.Body mass index is 21.27 kg/(m^2).  General  Appearance: Casual  Eye Contact::  Good  Speech:  Clear and Coherent  Volume:  Normal  Mood:  Irritable  Affect:  Appropriate  Thought Process:  Goal Directed  Orientation:  Full (Time, Place, and Person)  Thought Content:  WDL  Suicidal Thoughts:  No  Homicidal Thoughts:  No  Memory:  Immediate;   Fair Recent;   Fair Remote;   Fair  Judgement:  Impaired  Insight:  Shallow  Psychomotor Activity:  Normal  Concentration:  Fair  Recall:  [employer] of Knowledge:Fair  Language: Fair  Akathisia:  No  Handed:  Right  AIMS (if indicated):     Assets:  Communication Skills Desire for Improvement Physical Health Resilience Social Support  Sleep:  Number of Hours: 4.3  Cognition: WNL  ADL's:  Intact    COGNITIVE FEATURES THAT CONTRIBUTE TO RISK:  None    SUICIDE RISK:   Minimal: No identifiable suicidal ideation.  Patients presenting with no risk factors but with morbid ruminations; may be classified as minimal risk based on the severity of the depressive symptoms  PLAN OF CARE: Hospital admission, medication management,'s abuse counseling, discharge planning.  Stacy Peterson is a 25 year old female with a history of depression, mood instability and substance use admitted after suicide attempt by medication overdose.  1. Suicidal ideation. The patient adamantly denies any thoughts intentions or plans to hurt herself or others. She is able to contract for safety.  2. Mood. Initially the patient endorsed some symptoms of depression following the death of her boyfriend. Now she denies any symptoms of depression, anxiety except for OCD, or psychosis. She was started on a combination of Zyprexa and Trileptal and trazodone at [facility] emergency  room but she refuses to take any medications.  3. Smoking. Nicotine patch was available.  4. Vaginitis. We will continue clindamycin.  5. Substance abuse. She uses cannabis. She has no intention to stop. She declines substance abuse  treatment.  6. Disposition. She tells me that she will stay with her boyfriend. Follow-up is necessary at [facility].    I certify that inpatient services furnished can reasonably be expected to improve the patient's condition.   Kristine Linea, MD 01/20/2016, 11:20 AM
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
## 2016-01-20 NOTE — Progress Notes (Signed)
Patient denies SI/HI/AVH.  Verbalizes that she does not want to be here and is not agreeable to any treatment.  Refused all medications and refused to have blood drawn. Discharge instructions given.  Verbalized understanding.  Prescriptions given.  Patient refused to complete patient satisfaction survey.  States "I like you but I ain't doing that shit"

## 2016-01-20 NOTE — BHH Group Notes (Signed)
BHH Group Notes:  (Nursing/MHT/Case Management/Adjunct)  Date:  01/20/2016  Time:  3:17 PM  Type of Therapy:  Psychoeducational Skills  Participation Level:  Active  Participation Quality:  Appropriate, Sharing and Supportive  Affect:  Appropriate  Cognitive:  Appropriate  Insight:  Appropriate  Engagement in Group:  Supportive  Modes of Intervention:  Discussion and Education  Summary of Progress/Problems:  Marquette Old 01/20/2016, 3:17 PM

## 2016-01-20 NOTE — Progress Notes (Signed)
  [name] Adult Case Management Discharge Plan :  Will you be returning to the same living situation after discharge:  Yes,  pt will be returning to [facility] to live with her boyfriend or No. At discharge, do you have transportation home?: Yes,  pt will be picked up by her boyfriend Do you have the ability to pay for your medications: Yes,  pt will be provided with needed prescriptions at discharge  Release of information consent forms completed and in the chart;  Patient's signature needed at discharge.  Patient to Follow up at: Follow-up Information    Follow up with [facility].   Why:  Please arrive to the walk-in clinic between the hours of 12-3pm for fastest service, or between the hours of 9am-5pm Monday thru Thursday and between  9am-3pm on Fridays for your assessment for substance abuse treatment, therapy and medication managment.   Contact information:   [address] [place], [place] [address] Phone: [phone] Fax: [phone]      Next level of care provider has access to [facility] Link:no  Safety Planning and Suicide Prevention discussed: No, pt refused  Have you used any form of tobacco in the last 30 days? (Cigarettes, Smokeless Tobacco, Cigars, and/or Pipes): Yes  Has patient been referred to the Quitline?: Patient refused referral  Patient has been referred for addiction treatment: Yes  Stacy Peterson 01/20/2016, 1:16 PM

## 2016-01-20 NOTE — Progress Notes (Signed)
Patient escorted off unit by this writer to meet friend to travel home.
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
## 2016-01-20 NOTE — BHH Group Notes (Signed)
BHH LCSW Group Therapy  01/20/2016 3:15 PM  Type of Therapy:  Group Therapy  Participation Level:  Did Not Attend  Modes of Intervention:  Discussion, Education, Socialization and Support  Summary of Progress/Problems: Balance in life: Patients will discuss the concept of balance and how it looks and feels to be unbalanced. Pt will identify areas in their life that is unbalanced and ways to become more balanced.    Arul Farabee L Skylan Lara MSW, LCSWA  01/20/2016, 3:15 PM   

## 2016-01-20 NOTE — Tx Team (Signed)
Initial Interdisciplinary Treatment Plan   PATIENT STRESSORS: Financial difficulties Legal issue Loss of Boyfriend Marital or family conflict   PATIENT STRENGTHS: Capable of independent living Average or above average intelligence    PROBLEM LIST: Problem List/Patient Goals Date to be addressed Date deferred Reason deferred Estimated date of resolution  Anger 11-19-2016     AVH 11-19-2016     Depression 11-19-2016     Suicidal Ideation 11-19-2016            "I want to control my anger"      "I want help with seeing and hearing things that aren't there"                   DISCHARGE CRITERIA:  Adequate post-discharge living arrangements Improved stabilization in mood, thinking, and/or behavior Motivation to continue treatment in a less acute level of care  PRELIMINARY DISCHARGE PLAN: Outpatient therapy  PATIENT/FAMIILY INVOLVEMENT: This treatment plan has been presented to and reviewed with the patient, Stacy Peterson, and/or family member, .  The patient and family have been given the opportunity to ask questions and make suggestions.  Ernesto Rutherford 01/20/2016, 12:41 AM

## 2016-01-20 NOTE — [facility] Group Notes (Signed)
[facility] Group Notes:  (Nursing/MHT/Case Management/Adjunct)  Date:  01/20/2016  Time:  8:56 AM  Type of Therapy:  Community Group   Participation Level:  Did Not Attend  Stacy Peterson 01/20/2016, 8:56 AM 

## 2016-01-20 NOTE — H&P (Signed)
Psychiatric Admission Assessment Adult  Patient Identification: Stacy Peterson MRN:  [id number] Date of Evaluation:  01/20/2016 Chief Complaint:  Depression Principal Diagnosis: Schizoaffective disorder, bipolar type (HCC) Diagnosis:   Patient Active Problem List   Diagnosis Date Noted  . Intentional overdose of drug in tablet form (HCC) [T50.902A]   . Schizoaffective disorder, bipolar type (HCC) [F25.0] 01/17/2016  . Drug overdose, intentional (HCC) [T50.902A] 01/17/2016  . 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 Left-sided low back pain with sciatica [M54.42] 04/30/2014   History of Present Illness:  Identifying data. Stacy Peterson is a 25 year old female with history of depression, anxiety, and substance use.  Chief complaint. "I did not overdose on purpouse."  History of present illness. Information was obtained from the patient and the chart. 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 She was treated briefly several years ago with Abilify and Wellbutrin. She did not feel it was helpful. She adamantly any pharmacotherapy. She was brought to the hospital by her mother after a suicide attempt by medication overdose. The patient used other people's medication and posted a note on Facebook saying that she were dead. The patient now tells me that she did not overdose on purpose. She took couple of Xanaxes from a friend because she wanted to sleep. She reports they've conflict with her mother who believes that she is using drugs because she's been losing weight lately. The patient denies using any drugs except for occasional marijuana. The patient states that she posted note on Facebook to evoke support. She does have children and would never want to kill herself. She states "too many people want me dead already, I'm not going to do it'. She denies any psychotic symptoms or symptoms suggestive of bipolar mania. She does endorse OCD symptoms with  excessive cleaning and organizing but wishes no treatment.   Past psychiatric history. One prior hospitalization at [facility] at the age of 37 with treatment with Abilify and Wellbutrin. She was reportedly diagnosed bipolar and schizophrenia. She is not in the care of a psychiatrist now and not interested in the future. She denies ever attempting suicide before.  Family psychiatric history. None reported.  Social history. She obtained her GED's while in prison for armed robbery and kidnapping. There are no legal charges pending presently. Since her boyfriend died a few weeks ago she's been couch surfing. She stays with her mother at times spends time with her 2 children ages 36 and 39. She has custody of the children but the mother has been guardianship. She has not been able to find employment due to felony charges. Once she had the job but was able to keep it for two days only.   Total Time spent with patient: 1 hour  Past Psychiatric History: Mood instability.  Risk to Self: Is patient at risk for suicide?: No Risk to Others:   Prior Inpatient Therapy:   Prior Outpatient Therapy:    Alcohol Screening: 1. How often do you have a drink containing alcohol?: 2 to 3 times a week 2. How many drinks containing alcohol do you have on a typical day when you are drinking?: 3 or 4 3. How often do you have six or more drinks on one occasion?: Less than monthly Preliminary Score: 2 4. How often during the last year have you found that you were not able to stop drinking once you had started?: Never 5. How often during the last year have you  failed to do what was normally expected from you becasue of drinking?: Never 6. How often during the last year have you needed a first drink in the morning to get yourself going after a heavy drinking session?: Never 7. How often during the last year have you had a feeling of guilt of remorse after drinking?: Never 8. How often during the last year have you been  unable to remember what happened the night before because you had been drinking?: Never 9. Have you or someone else been injured as a result of your drinking?: No 10. Has a relative or friend or a doctor or another health worker been concerned about your drinking or suggested you cut down?: No Alcohol Use Disorder Identification Test Final Score (AUDIT): 5 Brief Intervention: AUDIT score less than 7 or less-screening does not suggest unhealthy drinking-brief intervention not indicated Substance Abuse History in the last 12 months:  Yes.   Consequences of Substance Abuse: Negative Previous Psychotropic Medications: Yes  Psychological Evaluations: No  Past Medical History:  Past Medical History  Diagnosis Date  . Shingles   . Trichomonas vaginitis   . Schizophrenia (HCC)   . Bipolar 1 disorder [facility])     Past Surgical History  Procedure Laterality Date  . Knee surgery    . Knee surgery     Family History:  Family History  Problem Relation Age of Onset  . Diabetes Paternal Grandmother   . Diabetes Paternal Grandfather    Family Psychiatric  History: None reported.  Social History:  History  Alcohol Use  . Yes     History  Drug Use  . Yes    Comment: marijuana inearly pregnancy    Social History   Social History  . Marital Status: Single    Spouse Name: N/A  . Number of Children: N/A  . Years of Education: N/A   Social History Main Topics  . Smoking status: Current Every Day Smoker -- 0.50 packs/day    Types: Cigarettes  . Smokeless tobacco: Never Used  . Alcohol Use: Yes  . Drug Use: Yes     Comment: marijuana inearly pregnancy  . Sexual Activity: Yes    Birth Control/ Protection: None   Other Topics Concern  . None   Social History Narrative   Additional Social History:                         Allergies:  No Known Allergies Lab Results: No results found for this or any previous visit (from the past 48 hour(s)).  Metabolic Disorder Labs:  No  results found for: HGBA1C, MPG No results found for: PROLACTIN No results found for: CHOL, TRIG, HDL, CHOLHDL, VLDL, LDLCALC  Current Medications: Current Facility-Administered Medications  Medication Dose Route Frequency Provider Last Rate Last Dose  . acetaminophen (TYLENOL) tablet 650 mg  650 mg Oral Q6H PRN Diamante Truszkowski B Zanae Kuehnle, MD      . alum & mag hydroxide-simeth (MAALOX/MYLANTA) 200-200-20 MG/5ML suspension 30 mL  30 mL Oral Q4H PRN Osama Coleson B Nahom Carfagno, MD      . clindamycin (CLEOCIN) capsule 300 mg  300 mg Oral 4 times per day Shari Prows, MD   300 mg at 01/20/16 1610  . magnesium hydroxide (MILK OF MAGNESIA) suspension 30 mL  30 mL Oral Daily PRN Venice Marcucci B Analisse Randle, MD      . nicotine (NICODERM CQ - dosed in mg/24 hours) patch 21 mg  21 mg Transdermal Q0600  Shari Prows, MD   21 mg at 01/20/16 1610  . OLANZapine (ZYPREXA) tablet 10 mg  10 mg Oral QHS Shari Prows, MD   10 mg at 01/19/16 2204  . Oxcarbazepine (TRILEPTAL) tablet 300 mg  300 mg Oral BID Shari Prows, MD   300 mg at 01/19/16 2204  . traZODone (DESYREL) tablet 100 mg  100 mg Oral QHS PRN Fareeha Evon B Olney Monier, MD       PTA Medications: Prescriptions prior to admission  Medication Sig Dispense Refill Last Dose  . clindamycin (CLEOCIN) 150 MG capsule Take 2 capsules (300 mg total) by mouth 4 (four) times daily. 56 capsule 0     Musculoskeletal: Strength & Muscle Tone: within normal limits Gait & Station: normal Patient leans: N/A  Psychiatric Specialty Exam: Physical Exam  Nursing note and vitals reviewed. Constitutional: She is oriented to person, place, and time. She appears well-developed and well-nourished.  HENT:  Head: Normocephalic and atraumatic.  Eyes: Conjunctivae and EOM are normal. Pupils are equal, round, and reactive to light.  Neck: Normal range of motion. Neck supple.  Cardiovascular: Normal rate, regular rhythm and normal heart sounds.   Respiratory: Effort  normal and breath sounds normal.  GI: Soft. Bowel sounds are normal.  Musculoskeletal: Normal range of motion.  Neurological: She is alert and oriented to person, place, and time.  Skin: Skin is warm and dry.    Review of Systems  All other systems reviewed and are negative.   Blood pressure 111/71, pulse 70, temperature 98.5 F (36.9 C), temperature source Oral, resp. rate 18, height  (1.626 m), weight 56.246 kg (124 lb), last menstrual period 12/27/2015, SpO2 100 %, unknown if currently breastfeeding.Body mass index is 21.27 kg/(m^2).  See SRA.                                                  Sleep:  Number of Hours: 4.3     Treatment Plan Summary: Daily contact with patient to assess and evaluate symptoms and progress in treatment and Medication management   Ms. Lantis is a 25 year old female with a history of depression, mood instability and substance use admitted after suicide attempt by medication overdose.  1. Suicidal ideation. The patient adamantly denies any thoughts intentions or plans to hurt herself or others. She is able to contract for safety.  2. Mood. Initially the patient endorsed some symptoms of depression following the death of her boyfriend. Now she denies any symptoms of depression, anxiety except for OCD, or psychosis. She was started on a combination of Zyprexa and Trileptal and trazodone at [facility] emergency room but she refuses to take any medications.  3. Smoking. Nicotine patch was available.  4. Vaginitis. We will continue clindamycin.  5. Substance abuse. She uses cannabis. She has no intention to stop. She declines substance abuse treatment.  6. Disposition. She tells me that she will stay with her boyfriend. Follow-up is necessary at [facility].  Observation Level/Precautions:  15 minute checks  Laboratory:  CBC Chemistry Profile UDS UA  Psychotherapy:    Medications:    Consultations:    Discharge Concerns:     Estimated LOS:  Other:     I certify that inpatient services furnished can reasonably be expected to improve the patient's condition.   Nikeya Maxim 1/26/201711:25 AM

## 2016-01-20 NOTE — Plan of Care (Signed)
Problem: Diagnosis: Increased Risk For Suicide Attempt Goal: LTG-Patient Will Show Positive Response to Medication LTG (by discharge) : Patient will show positive response to medication and will participate in the development of the discharge plan.  Outcome: Not Progressing Refuses to take medications.  States "I don't want to take medicine"

## 2016-01-20 NOTE — [facility] Suicide Risk Assessment (Signed)
[facility] Discharge Suicide Risk Assessment   Principal Problem: Schizoaffective disorder, bipolar type [facility]) Discharge Diagnoses:  Patient Active Problem List   Diagnosis Date Noted  . Intentional overdose of drug in tablet form (HCC) [T50.902A]   . Schizoaffective disorder, bipolar type (HCC) [F25.0] 01/17/2016  . Drug overdose, intentional (HCC) [T50.902A] 01/17/2016  . 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 Left-sided low back pain with sciatica [M54.42] 04/30/2014    Total Time spent with patient: 1 hour  Musculoskeletal: Strength & Muscle Tone: within normal limits Gait & Station: normal Patient leans: N/A  Psychiatric Specialty Exam: Review of Systems  All other systems reviewed and are negative.   Blood pressure 111/71, pulse 70, temperature 98.5 F (36.9 C), temperature source Oral, resp. rate 18, height  (1.626 m), weight 56.246 kg (124 lb), last menstrual period 12/27/2015, SpO2 100 %, unknown if currently breastfeeding.Body mass index is 21.27 kg/(m^2).  See SRA.                                                     Mental Status Per Nursing Assessment::   On Admission:     Demographic Factors:  Adolescent or young adult, Caucasian, Low socioeconomic status and Unemployed  Loss Factors: Decrease in vocational status and Loss of significant relationship  Historical Factors: Impulsivity  Risk Reduction Factors:   Responsible for children under 17 years of age, Sense of responsibility to family, Living with another person, especially a relative and Positive social support  Continued Clinical Symptoms:  Bipolar Disorder:   Bipolar II Depressive phase Alcohol/Substance Abuse/Dependencies Obsessive-Compulsive Disorder  Cognitive Features That Contribute To Risk:  None    Suicide Risk:  Minimal: No identifiable suicidal ideation.  Patients presenting with no risk factors but with morbid ruminations; may be classified as minimal risk  based on the severity of the depressive symptoms    Plan Of Care/Follow-up recommendations:  Activity:  As tolerated Diet:  Regular Other:  Keep follow-up appointments.  Kristine Linea, MD 01/20/2016, 1:09 PM

## 2016-01-21 NOTE — Discharge Summary (Signed)
Physician Discharge Summary Note  Patient:  Stacy Peterson is an 25 y.o., female MRN:  [id number] DOB:  September 06, 1991 Patient phone:  [phone] (home)  Patient address:   [address] [place] [address],  Total Time spent with patient: 1 hour  Date of Admission:  01/19/2016 Date of Discharge: 01/20/2016  Reason for Admission:  Suicide attempt.  Identifying data. Ms. Laham is a 25 year old female with history of depression, anxiety, and substance use.  Chief complaint. "I did not overdose on purpouse."  History of present illness. Information was obtained from the patient and the chart. 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 She was treated briefly several years ago with Abilify and Wellbutrin. She did not feel it was helpful. She adamantly any pharmacotherapy. She was brought to the hospital by her mother after a suicide attempt by medication overdose. The patient used other people's medication and posted a note on Facebook saying that she were dead. The patient now tells me that she did not overdose on purpose. She took couple of Xanaxes from a friend because she wanted to sleep. She reports they've conflict with her mother who believes that she is using drugs because she's been losing weight lately. The patient denies using any drugs except for occasional marijuana. The patient states that she posted note on Facebook to evoke support. She does have children and would never want to kill herself. She states "too many people want me dead already, I'm not going to do it'. She denies any psychotic symptoms or symptoms suggestive of bipolar mania. She does endorse OCD symptoms with excessive cleaning and organizing but wishes no treatment.   Past psychiatric history. One prior hospitalization at [facility] at the age of 74 with treatment with Abilify and Wellbutrin. She was reportedly diagnosed bipolar and schizophrenia. She is not in the care  of a psychiatrist now and not interested in the future. She denies ever attempting suicide before.  Family psychiatric history. None reported.  Social history. She obtained her GED's while in prison for armed robbery and kidnapping. There are no legal charges pending presently. Since her boyfriend died a few weeks ago she's been couch surfing. She stays with her mother at times spends time with her 2 children ages 64 and 30. She has custody of the children but the mother has been guardianship. She has not been able to find employment due to felony charges. Once she had the job but was able to keep it for two days only.   Principal Problem: Schizoaffective disorder, bipolar type [facility]) Discharge Diagnoses: Patient Active Problem List   Diagnosis Date Noted  . Intentional overdose of drug in tablet form (HCC) [T50.902A]   . Schizoaffective disorder, bipolar type (HCC) [F25.0] 01/17/2016  . Drug overdose, intentional (HCC) [T50.902A] 01/17/2016  . 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 Left-sided low back pain with sciatica [M54.42] 04/30/2014    Past Psychiatric History: depression, anxiety, mood instability.  Past Medical History:  Past Medical History  Diagnosis Date  . Shingles   . Trichomonas vaginitis   . Schizophrenia (HCC)   . Bipolar 1 disorder [facility])     Past Surgical History  Procedure Laterality Date  . Knee surgery    . Knee surgery     Family History:  Family History  Problem Relation Age of Onset  . Diabetes Paternal Grandmother   . Diabetes Paternal Grandfather    Family Psychiatric  History: none  reported. Social History:  History  Alcohol Use  . Yes     History  Drug Use  . Yes    Comment: marijuana inearly pregnancy    Social History   Social History  . Marital Status: Single    Spouse Name: N/A  . Number of Children: N/A  . Years of Education: N/A   Social History Main Topics  . Smoking status: Current Every Day Smoker -- 0.50 packs/day     Types: Cigarettes  . Smokeless tobacco: Never Used  . Alcohol Use: Yes  . Drug Use: Yes     Comment: marijuana inearly pregnancy  . Sexual Activity: Yes    Birth Control/ Protection: None   Other Topics Concern  . None   Social History Narrative    Hospital Course:    Ms. Cavagnaro is a 25 year old female with a history of depression, mood instability and substance use admitted after suicide attempt by medication overdose.  1. Suicidal ideation. The patient adamantly denies any thoughts intentions or plans to hurt herself or others. She is able to contract for safety.  2. Mood. Initially the patient endorsed some symptoms of depression following the death of her boyfriend. Now she denies any symptoms of depression, anxiety except for OCD, or psychosis. She was started on a combination of Zyprexa and Trileptal and trazodone at [facility] emergency room but she refuses to take any medications.  3. Smoking. Nicotine patch was available.  4. Vaginitis. We will continue clindamycin.  5. Substance abuse. She uses cannabis. She has no intention to stop. She declines substance abuse treatment.  6. Disposition. She tells me that she will stay with her boyfriend. [facility].  Physical Findings: AIMS:  , ,  ,  ,    CIWA:    COWS:     Musculoskeletal: Strength & Muscle Tone: within normal limits Gait & Station: normal Patient leans: N/A  Psychiatric Specialty Exam: Review of Systems  All other systems reviewed and are negative.   Blood pressure 111/71, pulse 70, temperature 98.5 F (36.9 C), temperature source Oral, resp. rate 18, height  (1.626 m), weight 56.246 kg (124 lb), last menstrual period 12/27/2015, SpO2 100 %, unknown if currently breastfeeding.Body mass index is 21.27 kg/(m^2).  See SRA.                                                  Sleep:  Number of Hours: 4.3   Have you used any form of tobacco in the last 30 days?  (Cigarettes, Smokeless Tobacco, Cigars, and/or Pipes): Yes  Has this patient used any form of tobacco in the last 30 days? (Cigarettes, Smokeless Tobacco, Cigars, and/or Pipes) Yes, Yes, A prescription for an FDA-approved tobacco cessation medication was offered at discharge and the patient refused  Metabolic Disorder Labs:  No results found for: HGBA1C, MPG No results found for: PROLACTIN No results found for: CHOL, TRIG, HDL, CHOLHDL, VLDL, LDLCALC  See Psychiatric Specialty Exam and Suicide Risk Assessment completed by Attending Physician prior to discharge.  Discharge destination:  Home  Is patient on multiple antipsychotic therapies at discharge:  No   Has Patient had three or more failed trials of antipsychotic monotherapy by history:  No  Recommended Plan for Multiple Antipsychotic Therapies: NA  Discharge Instructions    Diet - low  sodium heart healthy    Complete by:  As directed      Increase activity slowly    Complete by:  As directed             Medication List    TAKE these medications      Indication   clindamycin 150 MG capsule  Commonly known as:  CLEOCIN  Take 2 capsules (300 mg total) by mouth 4 (four) times daily.   Indication:  Vaginosis caused by Bacteria           Follow-up Information    Follow up with [facility].   Why:  Please arrive to the walk-in clinic between the hours of 12-3pm for fastest service, or between the hours of 9am-5pm Monday thru Thursday and between  9am-3pm on Fridays for your assessment for substance abuse treatment, therapy and medication managment.   Contact information:   [address] [place], [place] [address] Phone: [phone] Fax: [phone]      Follow-up recommendations:  Activity:  as tolerated. Diet:  regular. Other:  keep follow up appointment.  Comments:    Signed: Dashana Guizar 01/21/2016, 10:40 PM

## 2016-02-10 ENCOUNTER — Encounter (HOSPITAL_COMMUNITY): Payer: Self-pay

## 2016-02-10 ENCOUNTER — Emergency Department (HOSPITAL_COMMUNITY)
Admission: EM | Admit: 2016-02-10 | Discharge: 2016-02-10 | Disposition: A | Discharge status: Home / Self Care | Payer: No Typology Code available for payment source | Attending: Emergency Medicine | Admitting: Emergency Medicine

## 2016-02-10 DIAGNOSIS — F17210 Nicotine dependence, cigarettes, uncomplicated: Secondary | ICD-10-CM | POA: Insufficient documentation

## 2016-02-10 DIAGNOSIS — Z8619 Personal history of other infectious and parasitic diseases: Secondary | ICD-10-CM | POA: Insufficient documentation

## 2016-02-10 DIAGNOSIS — Z792 Long term (current) use of antibiotics: Secondary | ICD-10-CM | POA: Insufficient documentation

## 2016-02-10 DIAGNOSIS — J029 Acute pharyngitis, unspecified: Secondary | ICD-10-CM

## 2016-02-10 DIAGNOSIS — N6452 Nipple discharge: Secondary | ICD-10-CM

## 2016-02-10 DIAGNOSIS — Z8659 Personal history of other mental and behavioral disorders: Secondary | ICD-10-CM | POA: Insufficient documentation

## 2016-02-10 LAB — RAPID STREP SCREEN (MED CTR MEBANE ONLY): Streptococcus, Group A Screen (Direct): NEGATIVE

## 2016-02-10 MED ORDER — AMOXICILLIN-POT CLAVULANATE 875-125 MG PO TABS
1.0000 | ORAL_TABLET | Freq: Once | ORAL | Status: AC
  Administered 2016-02-10: 1 via ORAL
  Filled 2016-02-10: qty 1

## 2016-02-10 MED ORDER — DEXAMETHASONE SODIUM PHOSPHATE 10 MG/ML IJ SOLN
10.0000 mg | Freq: Once | INTRAMUSCULAR | Status: AC
  Administered 2016-02-10: 10 mg via INTRAMUSCULAR
  Filled 2016-02-10: qty 1

## 2016-02-10 MED ORDER — AMOXICILLIN-POT CLAVULANATE 875-125 MG PO TABS: 1.0000 | ORAL_TABLET | Freq: Two times a day (BID) | ORAL | Status: DC

## 2016-02-10 MED ORDER — IBUPROFEN 800 MG PO TABS: 800.0000 mg | ORAL_TABLET | Freq: Three times a day (TID) | ORAL | Status: DC

## 2016-02-10 MED ORDER — KETOROLAC TROMETHAMINE 60 MG/2ML IM SOLN
60.0000 mg | Freq: Once | INTRAMUSCULAR | Status: AC
  Administered 2016-02-10: 60 mg via INTRAMUSCULAR
  Filled 2016-02-10: qty 2

## 2016-02-10 NOTE — Discharge Instructions (Signed)
You have been seen today for sore throat and discharge around nipple piercing. Your lab tests showed no abnormalities. Follow up with PCP as needed. Return to ED should symptoms worsen. Please take all of your antibiotics until finished!   You may develop abdominal discomfort or diarrhea from the antibiotic.  You may help offset this with probiotics which you can buy or get in yogurt. Do not eat or take the probiotics until 2 hours after your antibiotic.     Emergency Department Resource Guide 1) Find a Doctor and Pay Out of Pocket Although you won't have to find out who is covered by your insurance plan, it is a good idea to ask around and get recommendations. You will then need to call the office and see if the doctor you have chosen will accept you as a new patient and what types of options they offer for patients who are self-pay. Some doctors offer discounts or will set up payment plans for their patients who do not have insurance, but you will need to ask so you aren't surprised when you get to your appointment.  2) Contact Your Local Health Department Not all health departments have doctors that can see patients for sick visits, but many do, so it is worth a call to see if yours does. If you don't know where your local health department is, you can check in your phone book. The CDC also has a tool to help you locate your state's health department, and many state websites also have listings of all of their local health departments.  3) Find a Walk-in Clinic If your illness is not likely to be very severe or complicated, you may want to try a walk in clinic. These are popping up all over the country in pharmacies, drugstores, and shopping centers. They're usually staffed by nurse practitioners or physician assistants that have been trained to treat common illnesses and complaints. They're usually fairly quick and inexpensive. However, if you have serious medical issues or chronic medical problems,  these are probably not your best option.  No Primary Care Doctor: - Call Health Connect at  [phone] - they can help you locate a primary care doctor that  accepts your insurance, provides certain services, etc. - Physician Referral Service- [phone]  Chronic Pain Problems: Organization         Address  Phone   Notes  [facility]  [phone] Patients need to be referred by their primary care doctor.   Medication Assistance: Organization         Address  Phone   Notes  [facility] Medication [facility] [address] [place], [place] [address] [phone] --Must be a resident of [facility] -- Must have NO insurance coverage whatsoever (no Medicaid/ Medicare, etc.) -- The pt. MUST have a primary care doctor that directs their care regularly and follows them in the community   [facility]  [phone]   [employer]  [phone]    Agencies that provide inexpensive medical care: Organization         Address  Phone   Notes  [facility]  [phone]   [facility]    [phone]   [facility] [address] [place], [place] [address] [phone]   [facility] [address] [place] [place] [phone]   [facility]    [phone]   [facility]    [phone]   [facility]  [address], [place] Phone:  [phone], Fax:  [phone] Hours of Operation:  9 am - 6 pm, M-F.  Also accepts Medicaid/Medicare and self-pay.  [facility]  [address], [place] Phone: [phone], Fax: [phone]. Hours of Operation:  8:30 am - 5:30 pm, M-F.  Also accepts Medicaid and self-pay.  [facility] [place] [address] [place] Phone: [phone]   [facility] [address] Natasha Bence [place], [place] [phone], Ext. [phone] Mondays &  Thursdays: 7-9 AM.  First 15 patients are seen on a first come, first serve basis.    Medicaid-accepting [facility] Providers:  Organization         Address  Phone   Notes  [facility] [address], [place] [phone] Also accepts self-pay patients.  [facility] [address] Laurell Josephs [place], [place]  [phone]   [facility] [address], [place] [phone]   [facility] [address], [place] [phone]   Renaye Rakers [address], [place]   [phone] Only accepts [place] patients after they have their name applied to their card.   Self-Pay (no insurance) in [facility]:  Organization         Address  Phone   Notes  Sickle Cell Patients, [facility] [address] [place], [place] [phone]   [facility] [address] [place], [place] [phone]   [facility]  [address], [place] [phone]   [facility]/Dr. Osei-Bonsu  [address] [place] or [address], [place] [phone] Phone number for both [place] and [place] locations is the same.  [facility] [address], [place] [phone]   [facility] [address], [place] or [address] [phone]   [facility] [address], [place] [phone], phone; [phone], fax Sees patients 1st and 3rd Saturday of every month.  Must not qualify for public or private insurance (i.e. Medicaid, Medicare, [place] Health Choice, Veterans' Benefits)  Household income should be no more than 200% of the poverty level The clinic cannot treat you if you are pregnant or think you are pregnant  Sexually transmitted diseases are not treated at the  clinic.    Dental Care: Organization         Address  Phone  Notes  [facility] [address] [place] [address] [place], [place] [phone] Accepts children up to age 28 who are enrolled in [place] or [place] Health Choice; pregnant women with a Medicaid card; and children who have applied for Medicaid or [place] Health Choice, but were declined, whose parents can pay a reduced fee at time of service.  [facility]  [address], [place] [phone] Accepts children up to age 69 who are enrolled in [place] or [place] Health Choice; pregnant women with a Medicaid card; and children who have applied for Medicaid or [place] Health Choice, but were declined, whose parents can pay a reduced fee at time of service.  [facility]  [address] [place], [place] [phone] Patients are seen by appointment only. Walk-ins are not accepted. [facility] will see patients 46 years of age and older. Monday - Tuesday (8am-5pm) Most Wednesdays (8:30-5pm) $30 per visit, cash only  [facility]  [address], [facility] [phone] Patients are seen by appointment only. Walk-ins are not accepted. [facility] will see patients 31 years of age and older. One Wednesday Evening (Monthly: Volunteer Based).  $30 per visit, cash only  [employer]  [phone] for adults; Children under age 31, call Graduate Pediatric Dentistry at [phone]. Children aged 39-14, please call [phone] to request a pediatric application.  Dental services are provided in all areas of dental care including fillings, crowns and bridges, complete and partial dentures, implants, gum treatment, root canals, and extractions. Preventive care is also provided. Treatment is provided to both adults and children. Patients are selected via a lottery and there is often a  waiting list.   [facility] [address] [place]  [phone] [email or web address]   [facility] [address] [place], [place] [phone], Ext. [phone] Second and Fourth Thursday of each month, opens at 6:30 AM; Clinic ends at 9 AM.  Patients are seen on a first-come first-served basis, and a limited number are seen during each clinic.   [facility]  [address] Ether Griffins [place], [place] [phone]   Eligibility Requirements You must have lived in [place], [place], or [place] counties for at least the last three months.   You cannot be eligible for state or federal sponsored [place], including [employer], [place], or [employer].   You generally cannot be eligible for healthcare insurance through your employer.    How to apply: Eligibility screenings are held every Tuesday and Wednesday afternoon from 1:00 pm until 4:00 pm. You do not need an appointment for the interview!  [facility] [address], [place], [place] [phone]   [facility] Department  [phone]   [facility] Health Department  [phone]   [facility] Health Department  [phone]    Behavioral Health Resources in the Community: Intensive Outpatient Programs Organization         Address  Phone  Notes  [facility] [address] [place], [place] [phone]   [facility] [address] [place], [place] [phone]   [facility] [address], [place], [place]  [phone]   [facility] [address],  [place], [place] [phone] or [phone]   Substance Abuse Resources Organization         Address  Phone  Notes  [facility]  [phone]   [facility]  [phone]   [facility]  [phone]   Floydene Flock  [phone]   Residential & Outpatient Substance Abuse  Program  [phone]   Psychological Services Organization         Address  Phone  Notes  [facility] Behavioral Health  [phone])[phone]   [facility] Services  [phone]   [facility] Mental Health [address] [place] [phone] or [phone]    Mobile Crisis Teams Organization         Address  Phone  Notes  [facility], Mobile Crisis Care Unit  [phone]   [facility]  [address] [place], [place] [phone]   °Sharon DeEsch [address] °[place] [phone]   ° °Self-Help/Support Groups °Organization         Address  Phone             Notes  °[facility] - variety of support groups  336- [phone] Call for more information  °Narcotics Anonymous (NA), [facility] [address], °[place]  2 meetings at this location  ° °Residential Treatment Programs °Organization         Address  Phone  Notes  °[facility] [address],    °[place]  [phone]   °[facility] ° [address], [place], [place] [phone]   °[facility] [address], [place] [phone] Admissions: 8am-3pm M-F  °[facility] [address]    °[place], [place] [phone]   °[facility] [address], [place], [place] [phone]   °[facility] [address]  °[place], [place] [phone]   °[facility] - Intensive Outpatient [address], [place], [place] [phone]   °[facility] [address]  °[place], [place] 1-877-615-2722 or [phone]   °[facility] ([facility]) [address] , [place] [phone] Accepts Medicaid  °[facility] [address]  °[place] 1-800-659-3381 Substance Abuse/Addiction Treatment  ° °[place] Behavioral Health Resources °Organization          Address  Phone  Notes  °[facility]  (888) [phone]   °Julie Brannon, PhD [address] [place], [place]   (336) [phone] or (336) 951-0000   °[facility]   [address] °[place], [place] (336) [phone]   °[facility] [address], [place], [place] (336) [phone] Insurance/Medicaid/sponsorship through [facility]  °[facility] [address]                                    [place], [place] [phone]) [phone] Therapy/tele-psych/case  °[facility] [address]  ° [place], [place] (336) [phone]    °Dr. Arfeen  (336) [phone]   °[facility] [place]  [employer] [place] [facility] 1) [address], [place] °2) [address], [place] °3)  [address], [place] (336) [phone] °(336) [phone] ° °(336) [phone]   °[place] Child Abuse Hotline (336) 342-1394 or (336) 342-3537 (After Hours)    ° ° ° °

## 2016-02-10 NOTE — ED Provider Notes (Signed)
CSN: [id number]     Arrival date & time 02/10/16  1143 History   First MD Initiated Contact with Patient 02/10/16 1157     Chief Complaint  Patient presents with  . Sore Throat     (Consider location/radiation/quality/duration/timing/severity/associated sxs/prior Treatment) HPI   Stacy Peterson is a 25 y.o. female, with a history of schizophrenia and bipolar, presenting to the ED with sore throat for the past four days. Pt states that she has dripping down the back of her throat and the pain is worse after sleep when the patient is lying down. Pt had tried tea, ibuprofen, chloraseptic spray, and cough syrup with no relief. Pt rates her pain at 8/10, throbbing, nonradiating. Pt also complains of bloody discharge and pain around her nipple ring that has been going on for about a week. Pt is still able to eat and drink. Pt denies fever/chills, N/V, shortness of breath, rashes, difficulty swallowing, or any other complaints. Patient is a one pack per day smoker.  Past Medical History  Diagnosis Date  . Shingles   . Trichomonas vaginitis   . Schizophrenia (HCC)   . Bipolar 1 disorder [facility])    Past Surgical History  Procedure Laterality Date  . Knee surgery    . Knee surgery     Family History  Problem Relation Age of Onset  . Diabetes Paternal Grandmother   . Diabetes Paternal Grandfather    Social History  Substance Use Topics  . Smoking status: Current Every Day Smoker -- 0.50 packs/day    Types: Cigarettes  . Smokeless tobacco: Never Used  . Alcohol Use: Yes   OB History    Gravida Para Term Preterm AB TAB SAB Ectopic Multiple Living   0 1   3     Review of Systems  Constitutional: Negative for fever, chills and diaphoresis.  HENT: Positive for sore throat. Negative for trouble swallowing and voice change.   Respiratory: Negative for cough and shortness of breath.   Gastrointestinal: Negative for nausea and vomiting.  Neurological: Negative for headaches.  All  other systems reviewed and are negative.     Allergies  Review of patient's allergies indicates no known allergies.  Home Medications   Prior to Admission medications   Medication Sig Start Date End Date Taking? Authorizing Provider  amoxicillin-clavulanate (AUGMENTIN) 875-125 MG tablet Take 1 tablet by mouth every 12 (twelve) hours. 02/10/16   Brodan Grewell C Haider Hornaday, PA-C  clindamycin (CLEOCIN) 150 MG capsule Take 2 capsules (300 mg total) by mouth 4 (four) times daily. 01/20/16   Shari Prows, MD  ibuprofen (ADVIL,MOTRIN) 800 MG tablet Take 1 tablet (800 mg total) by mouth 3 (three) times daily. 02/10/16   Darienne Belleau C Shaquan Puerta, PA-C   BP 127/86 mmHg  Pulse 95  Temp(Src) 98.1 F (36.7 C) (Oral)  Resp 20  SpO2 100%  LMP 01/27/2016 Physical Exam  Constitutional: She appears well-developed and well-nourished. No distress.  HENT:  Head: Normocephalic and atraumatic.  Mouth/Throat: Uvula is midline and mucous membranes are normal. No oral lesions. No trismus in the jaw. Normal dentition. No dental abscesses, uvula swelling or dental caries. Posterior oropharyngeal edema and posterior oropharyngeal erythema present.  Retropharyngeal swelling is bilateral, but increased on the left.   Eyes: Conjunctivae are normal. Pupils are equal, round, and reactive to light.  Neck: Normal range of motion. Neck supple.  Cardiovascular: Normal rate, regular rhythm, normal heart sounds and intact distal pulses.   Pulmonary/Chest:  Effort normal and breath sounds normal. No respiratory distress.  No discharge, erythema, swelling, increased warmth, or any other abnormalities noted to the patient's left nipple. No area of fluctuance or induration noted. RN, Rosalita Chessman, served as Biomedical engineer.  Abdominal: Soft. Bowel sounds are normal. There is no tenderness. There is no guarding.  Musculoskeletal: She exhibits no edema or tenderness.  Lymphadenopathy:    She has cervical adenopathy.  Neurological: She is alert.  Skin: Skin  is warm and dry. She is not diaphoretic.  Nursing note and vitals reviewed.   ED Course  Procedures (including critical care time) Labs Review Labs Reviewed  RAPID STREP SCREEN (NOT AT [facility])  CULTURE, GROUP A STREP [facility])    Imaging Review No results found. I have personally reviewed and evaluated these lab results as part of my medical decision-making.   EKG Interpretation None      MDM   Final diagnoses:  Sore throat  Nipple discharge    Nechama Guard resents for sore throat for the last 4 days.  Rapid strep is negative. Patient is nontoxic appearing, afebrile, not tachycardic, not tachypneic, maintains SPO2 of 100% on room air, and is in no apparent distress. Patient has no signs of sepsis or other serious or life-threatening condition. This patient's presentation is not suspicious for peritonsillar abscess as her pain and swelling are bilateral, but due to the intensity of the pain as well as the increased swelling on the left, antibiotics are warranted. The patient was given instructions for home care as well as return precautions. Patient voices understanding of these instructions, accepts the plan, and is comfortable with discharge.  Filed Vitals:   02/10/16 1145  BP: 127/86  Pulse: 95  Temp: 98.1 F (36.7 C)  TempSrc: Oral  Resp: 20  SpO2: 100%     Anselm Pancoast, PA-C 02/10/16 1324  Lyndal Pulley, MD 02/10/16 [phone]

## 2016-02-10 NOTE — ED Notes (Addendum)
Sore throat x 3 days.  No fever.  Nausea with no vomiting.  Pt wants nipple ring checked also.  Pt states she was recently here with IVC and cannot get script filled.  Unsure of what med was.

## 2016-02-11 ENCOUNTER — Emergency Department (HOSPITAL_COMMUNITY)
Admission: EM | Admit: 2016-02-11 | Discharge: 2016-02-11 | Disposition: A | Discharge status: Other Acute Inpatient Hospital | Payer: No Typology Code available for payment source | Attending: Emergency Medicine | Admitting: Emergency Medicine

## 2016-02-11 ENCOUNTER — Inpatient Hospital Stay (HOSPITAL_COMMUNITY)
Admission: EM | Admit: 2016-02-11 | Discharge: 2016-02-15 | DRG: 885 | Disposition: A | Discharge status: Home / Self Care | Payer: Federal, State, Local not specified - Other | Source: Intra-hospital | Attending: Psychiatry | Admitting: Psychiatry

## 2016-02-11 ENCOUNTER — Encounter (HOSPITAL_COMMUNITY): Payer: Self-pay | Admitting: *Deleted

## 2016-02-11 DIAGNOSIS — F339 Major depressive disorder, recurrent, unspecified: Secondary | ICD-10-CM | POA: Diagnosis present

## 2016-02-11 DIAGNOSIS — Z915 Personal history of self-harm: Secondary | ICD-10-CM | POA: Diagnosis not present

## 2016-02-11 DIAGNOSIS — G4700 Insomnia, unspecified: Secondary | ICD-10-CM | POA: Diagnosis present

## 2016-02-11 DIAGNOSIS — F410 Panic disorder [episodic paroxysmal anxiety] without agoraphobia: Secondary | ICD-10-CM | POA: Diagnosis present

## 2016-02-11 DIAGNOSIS — F17210 Nicotine dependence, cigarettes, uncomplicated: Secondary | ICD-10-CM | POA: Diagnosis present

## 2016-02-11 DIAGNOSIS — T1491 Suicide attempt: Secondary | ICD-10-CM | POA: Diagnosis not present

## 2016-02-11 DIAGNOSIS — F1920 Other psychoactive substance dependence, uncomplicated: Secondary | ICD-10-CM | POA: Diagnosis present

## 2016-02-11 DIAGNOSIS — R45851 Suicidal ideations: Secondary | ICD-10-CM | POA: Diagnosis present

## 2016-02-11 DIAGNOSIS — Z590 Homelessness: Secondary | ICD-10-CM | POA: Diagnosis not present

## 2016-02-11 DIAGNOSIS — Z8619 Personal history of other infectious and parasitic diseases: Secondary | ICD-10-CM | POA: Insufficient documentation

## 2016-02-11 DIAGNOSIS — Z833 Family history of diabetes mellitus: Secondary | ICD-10-CM

## 2016-02-11 DIAGNOSIS — F4310 Post-traumatic stress disorder, unspecified: Secondary | ICD-10-CM | POA: Diagnosis present

## 2016-02-11 DIAGNOSIS — F209 Schizophrenia, unspecified: Secondary | ICD-10-CM | POA: Insufficient documentation

## 2016-02-11 DIAGNOSIS — F1210 Cannabis abuse, uncomplicated: Secondary | ICD-10-CM | POA: Diagnosis present

## 2016-02-11 DIAGNOSIS — T50902A Poisoning by unspecified drugs, medicaments and biological substances, intentional self-harm, initial encounter: Secondary | ICD-10-CM | POA: Diagnosis not present

## 2016-02-11 DIAGNOSIS — F333 Major depressive disorder, recurrent, severe with psychotic symptoms: Principal | ICD-10-CM | POA: Diagnosis present

## 2016-02-11 DIAGNOSIS — F919 Conduct disorder, unspecified: Secondary | ICD-10-CM | POA: Insufficient documentation

## 2016-02-11 DIAGNOSIS — F32A Depression, unspecified: Secondary | ICD-10-CM

## 2016-02-11 DIAGNOSIS — Z3202 Encounter for pregnancy test, result negative: Secondary | ICD-10-CM | POA: Insufficient documentation

## 2016-02-11 DIAGNOSIS — F319 Bipolar disorder, unspecified: Secondary | ICD-10-CM

## 2016-02-11 DIAGNOSIS — F3130 Bipolar disorder, current episode depressed, mild or moderate severity, unspecified: Secondary | ICD-10-CM | POA: Insufficient documentation

## 2016-02-11 DIAGNOSIS — F329 Major depressive disorder, single episode, unspecified: Secondary | ICD-10-CM

## 2016-02-11 LAB — COMPREHENSIVE METABOLIC PANEL
ALK PHOS: 67 U/L (ref 38–126)
ALT: 11 U/L — ABNORMAL LOW (ref 14–54)
ANION GAP: 11 (ref 5–15)
AST: 15 U/L (ref 15–41)
Albumin: 4.5 g/dL (ref 3.5–5.0)
BILIRUBIN TOTAL: 0.4 mg/dL (ref 0.3–1.2)
BUN: 15 mg/dL (ref 6–20)
CALCIUM: 9.7 mg/dL (ref 8.9–10.3)
CO2: 24 mmol/L (ref 22–32)
Chloride: 104 mmol/L (ref 101–111)
Creatinine, Ser: 0.64 mg/dL (ref 0.44–1.00)
GFR calc non Af Amer: >60 mL/min (ref >60)
Glucose, Bld: 110 mg/dL — ABNORMAL HIGH (ref 65–99)
Potassium: 3.9 mmol/L (ref 3.5–5.1)
SODIUM: 139 mmol/L (ref 135–145)
TOTAL PROTEIN: 7.7 g/dL (ref 6.5–8.1)

## 2016-02-11 LAB — RAPID URINE DRUG SCREEN, HOSP PERFORMED
Amphetamines: NOT DETECTED
Barbiturates: NOT DETECTED
Benzodiazepines: NOT DETECTED
Cocaine: NOT DETECTED
Opiates: NOT DETECTED
Tetrahydrocannabinol: POSITIVE — AB

## 2016-02-11 LAB — CBC WITH DIFFERENTIAL/PLATELET
Basophils Absolute: 0 10*3/uL (ref 0.0–0.1)
Basophils Relative: 0 %
EOS ABS: 0 10*3/uL (ref 0.0–0.7)
Eosinophils Relative: 0 %
HCT: 34.3 % — ABNORMAL LOW (ref 36.0–46.0)
HEMOGLOBIN: 11.3 g/dL — AB (ref 12.0–15.0)
LYMPHS ABS: 1.8 10*3/uL (ref 0.7–4.0)
Lymphocytes Relative: 16 %
MCH: 28.8 pg (ref 26.0–34.0)
MCHC: 32.9 g/dL (ref 30.0–36.0)
MCV: 87.3 fL (ref 78.0–100.0)
MONO ABS: 1 10*3/uL (ref 0.1–1.0)
MONOS PCT: 9 %
NEUTROS PCT: 75 %
Neutro Abs: 8.3 10*3/uL — ABNORMAL HIGH (ref 1.7–7.7)
Platelets: 304 10*3/uL (ref 150–400)
RBC: 3.93 MIL/uL (ref 3.87–5.11)
RDW: 16 % — AB (ref 11.5–15.5)
WBC: 11 10*3/uL — ABNORMAL HIGH (ref 4.0–10.5)

## 2016-02-11 LAB — ACETAMINOPHEN LEVEL: Acetaminophen (Tylenol), Serum: <10 ug/mL — ABNORMAL LOW (ref 10–30)

## 2016-02-11 LAB — ETHANOL

## 2016-02-11 LAB — PREGNANCY, URINE: PREG TEST UR: NEGATIVE

## 2016-02-11 LAB — SALICYLATE LEVEL

## 2016-02-11 MED ORDER — IBUPROFEN 200 MG PO TABS
600.0000 mg | ORAL_TABLET | Freq: Once | ORAL | Status: AC
  Administered 2016-02-11: 600 mg via ORAL
  Filled 2016-02-11: qty 3

## 2016-02-11 MED ORDER — ALUM & MAG HYDROXIDE-SIMETH 200-200-20 MG/5ML PO SUSP
30.0000 mL | ORAL | Status: DC | PRN
Start: 2016-02-11 — End: 2016-02-15

## 2016-02-11 MED ORDER — ACETAMINOPHEN 325 MG PO TABS
650.0000 mg | ORAL_TABLET | Freq: Four times a day (QID) | ORAL | Status: DC | PRN
  Administered 2016-02-12 – 2016-02-13 (×2): 650 mg via ORAL
  Filled 2016-02-11 (×2): qty 2

## 2016-02-11 MED ORDER — LORAZEPAM 1 MG PO TABS
1.0000 mg | ORAL_TABLET | Freq: Four times a day (QID) | ORAL | Status: DC | PRN
  Administered 2016-02-11 – 2016-02-14 (×5): 1 mg via ORAL
  Filled 2016-02-11 (×5): qty 1

## 2016-02-11 MED ORDER — TRAZODONE HCL 50 MG PO TABS
50.0000 mg | ORAL_TABLET | Freq: Every evening | ORAL | Status: DC | PRN
  Administered 2016-02-11 – 2016-02-13 (×4): 50 mg via ORAL
  Filled 2016-02-11 (×2): qty 1
  Filled 2016-02-11: qty 14
  Filled 2016-02-11 (×2): qty 1

## 2016-02-11 MED ORDER — AMOXICILLIN-POT CLAVULANATE 875-125 MG PO TABS
1.0000 | ORAL_TABLET | Freq: Two times a day (BID) | ORAL | Status: DC
  Administered 2016-02-11 – 2016-02-15 (×6): 1 via ORAL
  Filled 2016-02-11 (×11): qty 1

## 2016-02-11 MED ORDER — MAGNESIUM HYDROXIDE 400 MG/5ML PO SUSP: 30.0000 mL | Freq: Every day | ORAL | Status: DC | PRN

## 2016-02-11 MED ORDER — OLANZAPINE 5 MG PO TABS
5.0000 mg | ORAL_TABLET | Freq: Every day | ORAL | Status: DC
  Administered 2016-02-11: 5 mg via ORAL
  Filled 2016-02-11 (×3): qty 1
  Filled 2016-02-11: qty 2

## 2016-02-11 NOTE — ED Provider Notes (Signed)
6:07 AM Patient medically cleared. She has been accepted at [facility] for transfer after 8AM. Accepting MD, Dr. Marella Bile.  Antony Madura, PA-C 02/11/16 1610  Gilda Crease, MD 02/11/16 [phone]

## 2016-02-11 NOTE — Progress Notes (Signed)
D: Patient alert and oriented x 4. Patient denies pain/HI/VH. Patient does not confirm or deny SI. Patient states, "I hear voices telling me to leave." Patient verbally contracted for safety. Patient was very tearful during assessment.  A: Staff to monitor Q 15 mins for safety. Encouragement and support offered. Scheduled medications administered per orders. R: Patient remains safe on the unit. Patient attended group tonight. Patient visible on the unit. Patient taking administered medications.
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
## 2016-02-11 NOTE — ED Notes (Signed)
Pt has been seen and wanded by security.   

## 2016-02-11 NOTE — [facility] Suicide Risk Assessment (Signed)
[facility] Admission Suicide Risk Assessment   Nursing information obtained from:    Demographic factors:    Current Mental Status:    Loss Factors:    Historical Factors:    Risk Reduction Factors:     Total Time spent with patient: 45 minutes Principal Problem: Depression, major, recurrent, severe with psychosis (HCC) Diagnosis:   Patient Active Problem List   Diagnosis Date Noted  . Recurrent major depression (HCC) [F33.9] 02/11/2016  . Depression, major, recurrent, severe with psychosis (HCC) [F33.3] 02/11/2016  . PTSD (post-traumatic stress disorder) [F43.10] 02/11/2016  . Polysubstance dependence (HCC) [F19.20] 02/11/2016  . Intentional overdose of drug in tablet form (HCC) [T50.902A]   . Schizoaffective disorder, bipolar type (HCC) [F25.0] 01/17/2016  . Drug overdose, intentional (HCC) [T50.902A] 01/17/2016  . 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 Left-sided low back pain with sciatica [M54.42] 04/30/2014   Subjective Data: see admission H and P  Continued Clinical Symptoms:  Alcohol Use Disorder Identification Test Final Score (AUDIT): 9 The "Alcohol Use Disorders Identification Test", Guidelines for Use in Primary Care, Second Edition.  World Science writer [facility]). Score between 0-7:  no or low risk or alcohol related problems. Score between 8-15:  moderate risk of alcohol related problems. Score between 16-19:  high risk of alcohol related problems. Score 20 or above:  warrants further diagnostic evaluation for alcohol dependence and treatment.   CLINICAL FACTORS:   Severe Anxiety and/or Agitation Depression:   Comorbid alcohol abuse/dependence Severe Alcohol/Substance Abuse/Dependencies   Psychiatric Specialty Exam: ROS  Blood pressure 114/74, pulse 63, temperature 98.1 F (36.7 C), temperature source Oral, resp. rate 15, height  (1.651 m), weight 54.432 kg (120 lb), last menstrual period 01/27/2016, SpO2 100 %, not currently breastfeeding.Body mass index  is 19.97 kg/(m^2).   COGNITIVE FEATURES THAT CONTRIBUTE TO RISK:  Closed-mindedness, Polarized thinking and Thought constriction (tunnel vision)    SUICIDE RISK:   Moderate:  Frequent suicidal ideation with limited intensity, and duration, some specificity in terms of plans, no associated intent, good self-control, limited dysphoria/symptomatology, some risk factors present, and identifiable protective factors, including available and accessible social support.  PLAN OF CARE: See admission H and P  I certify that inpatient services furnished can reasonably be expected to improve the patient's condition.   Rachael Fee, MD 02/11/2016, 4:56 PM

## 2016-02-11 NOTE — ED Notes (Signed)
Pt discharged ambulatory with [place] driver.  All belongings were returned to Pt.  Pt was in no distress at d/c.

## 2016-02-11 NOTE — Progress Notes (Signed)
Stacy Peterson was resting in bed upon approach. She appeared anxious and dysphoric but denied current SI. She contracted verbally for safety and asked about restarting an antibiotic. Spoke with Dr. Dub Mikes, who reordered. Pt remains safe at this time. Encouraged her to voice concerns/questions/needs and offered emotional support. Will continue to monitor for needs/safety.

## 2016-02-11 NOTE — ED Notes (Signed)
Delay in lab draws, pt is in a TTS  Consult.
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
## 2016-02-11 NOTE — ED Notes (Signed)
Pt has been changed into scrubs.  Pt has 2 bags.
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
## 2016-02-11 NOTE — ED Provider Notes (Signed)
CSN: [id number]     Arrival date & time 02/11/16  1308 History   First MD Initiated Contact with Patient 02/11/16 0319     Chief Complaint  Patient presents with  . Suicidal     (Consider location/radiation/quality/duration/timing/severity/associated sxs/prior Treatment) HPI Comments: 25 year old female with a history of schizophrenia and bipolar 1 disorder presents to the ED for evaluation of suicidal ideations. Patient reports plan to cut her wrists to kill herself. She has a history of behavioral health hospitalizations. Patient is very tearful, stating that she "just can't do it anymore". She reports that she found her roommate deceased in their apartment after an accidental OD. This occurred 4 days ago. She is concerned because she has nowhere to go. She states that she is unable to go home and live with her mother and children because her mother took out a restraining order on her. She has not had the money to get her psychiatric medications. She denies any homicidal thoughts, alcohol use, or illicit drug use.  The history is provided by the patient. No language interpreter was used.    Past Medical History  Diagnosis Date  . Shingles   . Trichomonas vaginitis   . Schizophrenia (HCC)   . Bipolar 1 disorder [facility])    Past Surgical History  Procedure Laterality Date  . Knee surgery    . Knee surgery     Family History  Problem Relation Age of Onset  . Diabetes Paternal Grandmother   . Diabetes Paternal Grandfather    Social History  Substance Use Topics  . Smoking status: Current Every Day Smoker -- 0.50 packs/day    Types: Cigarettes  . Smokeless tobacco: Never Used  . Alcohol Use: Yes   OB History    Gravida Para Term Preterm AB TAB SAB Ectopic Multiple Living   0 1   3      Review of Systems  Psychiatric/Behavioral: Positive for suicidal ideas and behavioral problems.  All other systems reviewed and are negative.   Allergies  Review of patient's  allergies indicates no known allergies.  Home Medications   Prior to Admission medications   Medication Sig Start Date End Date Taking? Authorizing Provider  amoxicillin-clavulanate (AUGMENTIN) 875-125 MG tablet Take 1 tablet by mouth every 12 (twelve) hours. Patient not taking: Reported on 02/11/2016 02/10/16   Shawn C Joy, PA-C  clindamycin (CLEOCIN) 150 MG capsule Take 2 capsules (300 mg total) by mouth 4 (four) times daily. Patient not taking: Reported on 02/11/2016 01/20/16   Shari Prows, MD  ibuprofen (ADVIL,MOTRIN) 800 MG tablet Take 1 tablet (800 mg total) by mouth 3 (three) times daily. Patient not taking: Reported on 02/11/2016 02/10/16   Shawn C Joy, PA-C   BP 133/88 mmHg  Pulse 94  Temp(Src) 98.1 F (36.7 C) (Oral)  Resp 16  Ht  (1.651 m)  Wt 54.432 kg  BMI 19.97 kg/m2  SpO2 100%  LMP 01/27/2016   Physical Exam  Constitutional: She is oriented to person, place, and time. She appears well-developed and well-nourished. No distress.  HENT:  Head: Normocephalic and atraumatic.  Eyes: Conjunctivae and EOM are normal. No scleral icterus.  Neck: Normal range of motion.  Cardiovascular: Normal rate, regular rhythm and intact distal pulses.   Pulmonary/Chest: Effort normal. No respiratory distress.  Musculoskeletal: Normal range of motion.  Neurological: She is alert and oriented to person, place, and time. She exhibits normal muscle tone. Coordination normal.  Skin: Skin  is warm and dry. No rash noted. She is not diaphoretic. No erythema. No pallor.  Psychiatric: Her speech is normal and behavior is normal. She exhibits a depressed mood. She expresses suicidal ideation. She expresses no homicidal ideation. She expresses suicidal plans. She expresses no homicidal plans.  Patient tearful  Nursing note and vitals reviewed.   ED Course  Procedures (including critical care time) Labs Review Labs Reviewed  CBC WITH DIFFERENTIAL/PLATELET - Abnormal; Notable for the  following:    WBC 11.0 (*)    Hemoglobin 11.3 (*)    HCT 34.3 (*)    RDW 16.0 (*)    Neutro Abs 8.3 (*)    All other components within normal limits  URINE RAPID DRUG SCREEN, HOSP PERFORMED - Abnormal; Notable for the following:    Tetrahydrocannabinol POSITIVE (*)    All other components within normal limits  PREGNANCY, URINE  COMPREHENSIVE METABOLIC PANEL  ACETAMINOPHEN LEVEL  SALICYLATE LEVEL  ETHANOL    Imaging Review No results found.   I have personally reviewed and evaluated these images and lab results as part of my medical decision-making.   EKG Interpretation None      MDM   Final diagnoses:  Depression with suicidal ideation  Schizophrenia, unspecified type (HCC)  Bipolar disorder, unspecified (HCC)    25 year old female presents to the emergency department for suicidal ideations and depression. She has a history of bipolar disorder and schizophrenia. Patient is pending medical clearance. She has been referred to an inpatient treatment program following TTS evaluation. Placement is currently pending. Disposition to be determined by oncoming ED provider. Patient will require medical clearance prior to transfer.   Filed Vitals:   02/11/16 0320  BP: 133/88  Pulse: 94  Temp: 98.1 F (36.7 C)  TempSrc: Oral  Resp: 16  Height:  (1.651 m)  Weight: 54.432 kg  SpO2: 100%     Antony Madura, PA-C 02/11/16 0539  Gilda Crease, MD 02/11/16 [phone]

## 2016-02-11 NOTE — Tx Team (Signed)
Initial Interdisciplinary Treatment Plan   PATIENT STRESSORS: Educational concerns Financial difficulties Health problems Legal issue   PATIENT STRENGTHS: Ability for insight Active sense of humor Average or above average intelligence Capable of independent living Communication skills   PROBLEM LIST: Problem List/Patient Goals Date to be addressed Date deferred Reason deferred Estimated date of resolution  Suicidal Ideation 02/11/16     Depression with Alcoholism 02/11/16     Trauma with PTSD 02/11/16                 " I loved him a lot" 02/11/16     " Im a kind person" 02/11/16                  DISCHARGE CRITERIA:  Ability to meet basic life and health needs Adequate post-discharge living arrangements Improved stabilization in mood, thinking, and/or behavior Medical problems require only outpatient monitoring  PRELIMINARY DISCHARGE PLAN:   PATIENT/FAMIILY INVOLVEMENT: This treatment plan has been presented to and reviewed with the patient, Stacy Peterson, and/or family member, .  The patient and family have been given the opportunity to ask questions and make suggestions.  Rich Brave 02/11/2016, 3:32 PM
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
## 2016-02-11 NOTE — ED Notes (Signed)
Patient is alert and oriented x4.  She is complaining of suicidal thoughts that  Started in the last week.  Patient denies any plan.  Patient states that "I'm tired  And I don't wanna do this anymore"  Patient adds that she does not have anyone  Left to help her.  She has not taken any of her medications due to she can't afford them.

## 2016-02-11 NOTE — Progress Notes (Signed)
Pt did not attend evening AA group.  

## 2016-02-12 DIAGNOSIS — F333 Major depressive disorder, recurrent, severe with psychotic symptoms: Principal | ICD-10-CM

## 2016-02-12 DIAGNOSIS — F1920 Other psychoactive substance dependence, uncomplicated: Secondary | ICD-10-CM

## 2016-02-12 DIAGNOSIS — F4310 Post-traumatic stress disorder, unspecified: Secondary | ICD-10-CM

## 2016-02-12 LAB — CULTURE, GROUP A STREP (THRC)

## 2016-02-12 LAB — TSH: TSH: 0.458 u[IU]/mL (ref 0.350–4.500)

## 2016-02-12 MED ORDER — OLANZAPINE 5 MG PO TABS
5.0000 mg | ORAL_TABLET | Freq: Once | ORAL | Status: AC
  Administered 2016-02-12: 5 mg via ORAL

## 2016-02-12 NOTE — Progress Notes (Addendum)
D: Patient alert and oriented x 4. Patient denies SI/HI/AVH. Patient was very tearful first part of shift. Verbal order to give HS Zyprexa to be given early.  Zyprexa was given at 1923 with effective results. Patient was able to calm down. Patient states, "I am tired of feeling this way."  This Clinical research associate gave encouragement and support.  Patient did come to AA group this shift.  Patient complained of a headache 9/10 in pain. PRN tylenol given at 1933. Pain reassessed at 2033 patient reports was a 2/10.  A: Staff to monitor Q 15 mins for safety. Encouragement and support offered. Scheduled medications administered per orders. R: Patient remains safe on the unit. Patient attended group tonight. Patient visible on the unit. Patient taking administered medications.
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
## 2016-02-12 NOTE — Progress Notes (Signed)
Stacy Peterson has spent the day sleeping . She became very agitated and upset when this writer woke her this morning to speak to the MD> [facility] said ' No..ibuprofen;m not playing your games.Marland Kitchenibuprofen'm not going to do it.Marland KitchenMarland KitchenMarland KitchenLeave me alone.Marland Kitchenibuprofen'm not taking your medicines..Internal Medicine not filling out your papers...just leave me alone". She did allow MD to speak with her briefly and she signed a 72 hour request for discharge. A This nurse attempted to coplete pt's daily assessment and she refused t answer the question. She did agree  ( with this nurse)to not hurt herself. She has refused all medications offered ehr today. R Safety is in place.

## 2016-02-12 NOTE — BHH Group Notes (Signed)
[facility] Group Notes:  (Nursing/MHT/Case Management/Adjunct)  Date:  02/12/2016  Time:  1315  Type of Therapy:  Nurse Education  /  Shanda Howells Skills ; The group focuses on teaching patients how to identify their needs as well as to idnetify healthy coping skills needed to get their needs met.  Participation Level:  Did Not Attend  Participation Quality:  n/a  Affect:  n/a  Cognitive:  n/a  Insight:  n/a  Engagement in Group:  n/a  Modes of Intervention:  n/a  Summary of Progress/Problems:n/a  Lauralyn Primes 02/12/2016, 4:46 PM

## 2016-02-12 NOTE — BHH Group Notes (Signed)
B[facility] Group Notes: (Clinical Social Work)   02/12/2016      Type of Therapy:  Group Therapy   Participation Level:  Did Not Attend despite MHT prompting   Miachel Nardelli Grossman-Orr, LCSW 02/12/2016, 12:09 PM     

## 2016-02-12 NOTE — Progress Notes (Signed)
The patient attended this evening's A. A. Meeting and was appropriate.  

## 2016-02-13 LAB — LIPID PANEL
CHOL/HDL RATIO: 2.7 ratio
Cholesterol: 156 mg/dL (ref 0–200)
HDL: 57 mg/dL (ref >40)
LDL CALC: 85 mg/dL (ref 0–99)
Triglycerides: 70 mg/dL (ref <150)
VLDL: 14 mg/dL (ref 0–40)

## 2016-02-13 MED ORDER — OLANZAPINE 5 MG PO TABS
5.0000 mg | ORAL_TABLET | Freq: Two times a day (BID) | ORAL | Status: DC
  Administered 2016-02-13 – 2016-02-14 (×2): 5 mg via ORAL
  Filled 2016-02-13 (×6): qty 1

## 2016-02-13 MED ORDER — OXCARBAZEPINE 150 MG PO TABS
150.0000 mg | ORAL_TABLET | Freq: Two times a day (BID) | ORAL | Status: DC
  Administered 2016-02-13 – 2016-02-14 (×2): 150 mg via ORAL
  Filled 2016-02-13 (×6): qty 1

## 2016-02-13 NOTE — Plan of Care (Signed)
Problem: Alteration in mood & ability to function due to Goal: STG-Patient will attend groups Outcome: Progressing Patient attended AA meeting in day room 05/11/2016

## 2016-02-13 NOTE — Progress Notes (Signed)
Patient did attend the evening speaker AA meeting.  

## 2016-02-13 NOTE — Progress Notes (Signed)
D.  Pt with flat depressed affect on approach, very little forwarded.  Pt appears guarded but pleasant.  Pt did attend evening AA group, interacting minimally with peers on the unit.  Pt did spend time talking with day shift RN about her situation today for the first time and was given support 1:1  Pt denies SI/HI/hallucinations at this time.  A.  Support and encouragement offered, medication given as ordered.  R. Pt remains safe on the unit, will continue to monitor.

## 2016-02-13 NOTE — Plan of Care (Signed)
Problem: Alteration in mood & ability to function due to Goal: STG-Patient will report withdrawal symptoms Outcome: Progressing Patient denies any withdraw symptoms at time of assessment.

## 2016-02-13 NOTE — BHH Group Notes (Signed)
[facility] Group Notes: (Clinical Social Work)   02/13/2016      Type of Therapy:  Group Therapy   Participation Level:  Did Not Attend despite MHT prompting   Ambrose Mantle, LCSW 02/13/2016, 11:30 AM
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
## 2016-02-13 NOTE — [facility] Group Notes (Signed)
[facility] Group Notes:  (Nursing/MHT/Case Management/Adjunct)  Date:  02/13/2016  Time: 1315   Type of Therapy:  Nurse Education  /   Life Skills :; The group focuses on teaching patients how to identify their needs .  Participation Level:  Minimal  Participation Quality:  Attentive  Affect:  Flat  Cognitive:  Alert  Insight:  Improving  Engagement in Group:  Engaged  Modes of Intervention:  Discussion and Education  Summary of Progress/Problems:  Rich Brave 02/13/2016, 3:39 PM

## 2016-02-14 LAB — HEMOGLOBIN A1C
Hgb A1c MFr Bld: 5.3 % (ref 4.8–5.6)
Mean Plasma Glucose: 105 mg/dL

## 2016-02-14 MED ORDER — OXCARBAZEPINE 300 MG PO TABS
300.0000 mg | ORAL_TABLET | Freq: Two times a day (BID) | ORAL | Status: DC
  Administered 2016-02-14 – 2016-02-15 (×2): 300 mg via ORAL
  Filled 2016-02-14 (×4): qty 1

## 2016-02-14 MED ORDER — CLONAZEPAM 1 MG PO TABS
1.0000 mg | ORAL_TABLET | Freq: Three times a day (TID) | ORAL | Status: DC | PRN
  Administered 2016-02-14 – 2016-02-15 (×2): 1 mg via ORAL
  Filled 2016-02-14 (×3): qty 1

## 2016-02-14 MED ORDER — OLANZAPINE 5 MG PO TBDP: 5.0000 mg | ORAL_TABLET | Freq: Four times a day (QID) | ORAL | Status: DC | PRN

## 2016-02-14 MED ORDER — OLANZAPINE 10 MG PO TABS
10.0000 mg | ORAL_TABLET | Freq: Two times a day (BID) | ORAL | Status: DC
  Administered 2016-02-14 – 2016-02-15 (×2): 10 mg via ORAL
  Filled 2016-02-14 (×5): qty 1

## 2016-02-14 NOTE — [facility] Group Notes (Signed)
[facility] LCSW Aftercare Discharge Planning Group Note   02/14/2016 10:36 AM  Participation Quality:  Invited. DID NOT ATTEND. Pt chose to remain in bed.   Smart, Alford Gamero LCSW

## 2016-02-14 NOTE — Progress Notes (Signed)
Pt did not attend evening AA group. Pt was in bed.  

## 2016-02-14 NOTE — Plan of Care (Signed)
Problem: Consults Goal: Anxiety Disorder Patient Education See Patient Education Module for eduction specifics.  Outcome: Not Met (add Reason) Nurse discussed anxiety/coping skills/depression with patient.  Patient has been very upset/angry.

## 2016-02-14 NOTE — Progress Notes (Signed)
[facility] MD Progress Note  02/14/2016 6:37 PM PRIYAH Peterson  MRN:  [id number] Subjective:  Admits to mood liability. States that she goes from being isolated sleeping all the time to having anger outbursts. She tells again about all the traumatic events in her life and how she is still dealing with them. Describes episodes suggestive of dissociative phenomena and disturbance of perception She wants to get better and she wants  to get better now. .  Principal Problem: Depression, major, recurrent, severe with psychosis (HCC) Diagnosis:   Patient Active Problem List   Diagnosis Date Noted  . Recurrent major depression (HCC) [F33.9] 02/11/2016  . Depression, major, recurrent, severe with psychosis (HCC) [F33.3] 02/11/2016  . PTSD (post-traumatic stress disorder) [F43.10] 02/11/2016  . Polysubstance dependence (HCC) [F19.20] 02/11/2016  . Intentional overdose of drug in tablet form (HCC) [T50.902A]   . Schizoaffective disorder, bipolar type (HCC) [F25.0] 01/17/2016  . Drug overdose, intentional (HCC) [T50.902A] 01/17/2016  . 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 Left-sided low back pain with sciatica [M54.42] 04/30/2014   Total Time spent with patient: 30 minutes  Past Psychiatric History: see admission H and P  Past Medical History:  Past Medical History  Diagnosis Date  . Shingles   . Trichomonas vaginitis   . Schizophrenia (HCC)   . Bipolar 1 disorder [facility])     Past Surgical History  Procedure Laterality Date  . Knee surgery    . Knee surgery     Family History:  Family History  Problem Relation Age of Onset  . Diabetes Paternal Grandmother   . Diabetes Paternal Grandfather    Family Psychiatric  History: see admission H and P Social History:  History  Alcohol Use  . 7.2 oz/week  . 12 Cans of beer per week     History  Drug Use  . Yes  . Special: Marijuana    Comment: marijuana inearly pregnancy    Social History   Social History  . Marital Status: Single     Spouse Name: N/A  . Number of Children: N/A  . Years of Education: N/A   Social History Main Topics  . Smoking status: Current Every Day Smoker -- 0.50 packs/day    Types: Cigarettes  . Smokeless tobacco: Never Used  . Alcohol Use: 7.2 oz/week    12 Cans of beer per week  . Drug Use: Yes    Special: Marijuana     Comment: marijuana inearly pregnancy  . Sexual Activity: Yes    Birth Control/ Protection: None   Other Topics Concern  . None   Social History Narrative   Additional Social History:    Pain Medications: n/a History of alcohol / drug use?: Yes Longest period of sobriety (when/how long):  4 days Negative Consequences of Use: Financial, Legal, Personal relationships Name of Substance 1: alcohol 1 - Age of First Use: 25yo 1 - Amount (size/oz): AMAP 1 - Frequency: AOAP 1 - Duration: ALAP 1 - Last Use / Amount: 2 days ago Name of Substance 2: pot 2 - Age of First Use: 14-15yo                Sleep: Fair  Appetite:  Fair  Current Medications: Current Facility-Administered Medications  Medication Dose Route Frequency Provider Last Rate Last Dose  . acetaminophen (TYLENOL) tablet 650 mg  650 mg Oral Q6H PRN Rachael Fee, MD   650 mg at 02/13/16 1324  . alum & mag hydroxide-simeth (MAALOX/MYLANTA) 200-200-20 MG/5ML suspension  30 mL  30 mL Oral Q4H PRN Rachael Fee, MD      . amoxicillin-clavulanate (AUGMENTIN) 875-125 MG per tablet 1 tablet  1 tablet Oral BID Rachael Fee, MD   1 tablet at 02/14/16 1649  . clonazePAM (KLONOPIN) tablet 1 mg  1 mg Oral TID PRN Rachael Fee, MD   1 mg at 02/14/16 1504  . magnesium hydroxide (MILK OF MAGNESIA) suspension 30 mL  30 mL Oral Daily PRN Rachael Fee, MD      . OLANZapine [facility]) tablet 10 mg  10 mg Oral BID Rachael Fee, MD   10 mg at 02/14/16 1650  . OLANZapine zydis (ZYPREXA) disintegrating tablet 5 mg  5 mg Oral Q6H PRN Rachael Fee, MD      . Oxcarbazepine (TRILEPTAL) tablet 300 mg  300 mg Oral BID Rachael Fee, MD   300 mg at 02/14/16 1650  . traZODone (DESYREL) tablet 50 mg  50 mg Oral QHS PRN Rachael Fee, MD   50 mg at 02/13/16 2219    Lab Results:  Results for orders placed or performed during the hospital encounter of 02/11/16 (from the past 48 hour(s))  Hemoglobin A1c     Status: None   Collection Time: 02/12/16  6:50 PM  Result Value Ref Range   Hgb A1c MFr Bld 5.3 4.8 - 5.6 %    Comment: (NOTE)         Pre-diabetes: 5.7 - 6.4         Diabetes: >6.4         Glycemic control for adults with diabetes: <7.0    Mean Plasma Glucose 105 mg/dL    Comment: (NOTE) Performed At: [facility] [address] [place], [place] [id number] Mila Homer MD WU:[id number] Performed at [facility]   Lipid panel, fasting     Status: None   Collection Time: 02/12/16  6:50 PM  Result Value Ref Range   Cholesterol 156 0 - 200 mg/dL   Triglycerides 70 <956 mg/dL   HDL 57 >21 mg/dL   Total CHOL/HDL Ratio 2.7 RATIO   VLDL 14 0 - 40 mg/dL   LDL Cholesterol 85 0 - 99 mg/dL    Comment:        Total Cholesterol/HDL:CHD Risk Coronary Heart Disease Risk Table                     Men   Women  1/2 Average Risk   3.4   3.3  Average Risk       5.0   4.4  2 X Average Risk   9.6   7.1  3 X Average Risk  23.4   11.0        Use the calculated Patient Ratio above and the CHD Risk Table to determine the patient's CHD Risk.        ATP III CLASSIFICATION (LDL):  <100     mg/dL   Optimal  308-657  mg/dL   Near or Above                    Optimal  130-159  mg/dL   Borderline  846-962  mg/dL   High  >952     mg/dL   Very High Performed at [facility]   TSH     Status: None   Collection Time: 02/12/16  6:50 PM  Result Value Ref Range  TSH 0.458 0.350 - 4.500 uIU/mL    Comment: Performed at [facility]    Blood Alcohol level:  Lab Results  Component Value Date   [facility] <5 02/11/2016   ETH <5 01/16/2016    Physical Findings: AIMS: Facial  and Oral Movements Muscles of Facial Expression: None, normal Lips and Perioral Area: None, normal Jaw: None, normal Tongue: None, normal,Extremity Movements Upper (arms, wrists, hands, fingers): None, normal Lower (legs, knees, ankles, toes): None, normal, Trunk Movements Neck, shoulders, hips: None, normal, Overall Severity Severity of abnormal movements (highest score from questions above): None, normal Incapacitation due to abnormal movements: None, normal Patient's awareness of abnormal movements (rate only patient's report): No Awareness, Dental Status Current problems with teeth and/or dentures?: No Does patient usually wear dentures?: No  CIWA:  CIWA-Ar Total: 2 COWS:  COWS Total Score: 4  Musculoskeletal: Strength & Muscle Tone: within normal limits Gait & Station: normal Patient leans: normal  Psychiatric Specialty Exam: Review of Systems  Constitutional: Negative.   HENT: Negative.   Eyes: Negative.   Respiratory: Negative.   Cardiovascular: Negative.   Gastrointestinal: Negative.   Genitourinary: Negative.   Musculoskeletal: Negative.   Skin:       "crawling sensation"  Neurological: Negative.   Endo/Heme/Allergies: Negative.   Psychiatric/Behavioral: Positive for depression. The patient is nervous/anxious.     Blood pressure 128/79, pulse 136, temperature 97.7 F (36.5 C), temperature source Oral, resp. rate 16, height  (1.651 m), weight 54.432 kg (120 lb), last menstrual period 01/27/2016, SpO2 100 %, not currently breastfeeding.Body mass index is 19.97 kg/(m^2).  General Appearance: Fairly Groomed  Patent attorney::  Fair  Speech:  Clear and Coherent  Volume:  fluctuates  Mood:  Anxious, Depressed, Dysphoric, Hopeless and Irritable  Affect:  Labile and Tearful  Thought Process:  Coherent and Goal Directed  Orientation:  Full (Time, Place, and Person)  Thought Content:  symptoms events worries concerns  Suicidal Thoughts:  No  Homicidal Thoughts:  No   Memory:  Immediate;   Fair Recent;   Fair Remote;   Fair  Judgement:  Fair  Insight:  Shallow  Psychomotor Activity:  Restlessness  Concentration:  Fair  Recall:  [employer] of Knowledge:Fair  Language: Fair  Akathisia:  No  Handed:  Right  AIMS (if indicated):     Assets:  Desire for Improvement  ADL's:  Intact  Cognition: WNL  Sleep:  Number of Hours: 5.75   Treatment Plan Summary: Daily contact with patient to assess and evaluate symptoms and progress in treatment and Medication management Supportive approach/coping skills Mood instability; will increase the Trileptal to 300 mg BID and the Zyprexa to 10 mg BID Hallucinations; will increase the Zyprexa to 10 mg BID Acute anxiety-restlessness states the Ativan is not doing anything, will try Klonopin 1 mg  Will work with CBT/mindfulness/introduce DBT Rachael Fee, MD 02/14/2016, 6:37 PM

## 2016-02-14 NOTE — Progress Notes (Addendum)
Patient dropped klonipin pill in floor, patient picked up pill and gave to nurse.  Patient stated to nurse that she did not have to give her the pill if she did not want to.  Nurse went to charge nurse and other nurses in charting room and told them what patient had said.  Rn on 300 hall came in med room with this nurse and patient came back to window and accused this nurse of talking about her.  Both nurses in [address] med room told patient that no one was talking about her that we had to destroy the klonipin pill that she dropped on floor and had charge nurse in med room with the other 2 nurses.  Patient walked away in a flurry upset because this nurse was talking about her.  This nurse had a witness with her at all times, no unnecessary talk was made in front of patient.  Charge nurse informed of all interactions with this patient.  MD will be informed as soon as he is available of patient's accusations against this nurse.  Patient has refused several times throughout the day to fill out her self inventory form.  Patient has stated that she is very upset, does not want to be here, and wants to go home.  This place is upsetting her.
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
## 2016-02-14 NOTE — Progress Notes (Signed)
MD informed of patient's behavior this afternoon.  No rooms available to make this patient a Do Not Admit Room.  MD informed that patient stated her skin was itching/crawling, no rash on face/back.  Respirations even and unlabored.  No signs/symptoms of pain/distress noted on patient's face/body movements.  Safety maintained with 15 minute checks.

## 2016-02-14 NOTE — [facility] Group Notes (Signed)
[facility] LCSW Group Therapy  02/14/2016 1:03 PM  Type of Therapy:  Group Therapy  Participation Level:  Did Not Attend-pt meeting with MD during group. Excused.   Modes of Intervention:  Confrontation, Discussion, Education, Exploration, Problem-solving, Rapport Building, Socialization and Support  Summary of Progress/Problems: Today's Topic: Overcoming Obstacles. Patients identified one short term goal and potential obstacles in reaching this goal. Patients processed barriers involved in overcoming these obstacles. Patients identified steps necessary for overcoming these obstacles and explored motivation (internal and external) for facing these difficulties head on.   Smart, Stacy Digiulio LCSW 02/14/2016, 1:03 PM

## 2016-02-15 MED ORDER — OLANZAPINE 10 MG PO TABS: 10.0000 mg | ORAL_TABLET | Freq: Two times a day (BID) | ORAL | Status: DC

## 2016-02-15 MED ORDER — OXCARBAZEPINE 300 MG PO TABS: 300.0000 mg | ORAL_TABLET | Freq: Two times a day (BID) | ORAL | Status: DC

## 2016-02-15 MED ORDER — TRAZODONE HCL 50 MG PO TABS: 50.0000 mg | ORAL_TABLET | Freq: Every evening | ORAL | Status: DC | PRN

## 2016-02-15 MED ORDER — AMOXICILLIN-POT CLAVULANATE 875-125 MG PO TABS: 1.0000 | ORAL_TABLET | Freq: Two times a day (BID) | ORAL | Status: DC

## 2016-02-15 NOTE — Progress Notes (Signed)
  [facility] Adult Case Management Discharge Plan :  Will you be returning to the same living situation after discharge:  Yes,  home At discharge, do you have transportation home?: Yes,  friend  Do you have the ability to pay for your medications: Yes,  mental health  Release of information consent forms completed and submitted to medical records by CSW.  Patient to Follow up at: Follow-up Information    Follow up with [facility].   Why:  Walk in between 8am-9am within two days of discharge from the hospital to be seen for hospital follow-up/medication management/assessment for counseling services. Thank you.    Contact information:   [address], [place] [address] Phone: [phone] Fax: [phone]      Next level of care provider has access to [facility] Link:no  Safety Planning and Suicide Prevention discussed: Yes,  SPE completed with pt, as she declined family contact.  Have you used any form of tobacco in the last 30 days? (Cigarettes, Smokeless Tobacco, Cigars, and/or Pipes): Yes  Has patient been referred to the Quitline?: Patient refused referral  Patient has been referred for addiction treatment: Yes-pt now consents to [facility] for follow-up.   Smart, Nainika Newlun LCSW 02/15/2016, 11:21 AM

## 2016-02-15 NOTE — Progress Notes (Signed)
  [facility] Adult Case Management Discharge Plan :  Will you be returning to the same living situation after discharge:  Yes,  friend's house At discharge, do you have transportation home?: Yes,  friend or bus pass Do you have the ability to pay for your medications: Yes,  mental health  Release of information consent forms completed and submitted to medical records by CSW.  Patient to Follow up at: Follow-up Information    Follow up with Patient declined all follow-up. Marland Kitchen      Next level of care provider has access to [facility] Link:no  Safety Planning and Suicide Prevention discussed: Yes,  SPE completed with pt, as she declined to consent to family contact. SPI pamphlet and mobile crisis information provided to pt.   Have you used any form of tobacco in the last 30 days? (Cigarettes, Smokeless Tobacco, Cigars, and/or Pipes): Yes  Has patient been referred to the Quitline?: Patient refused referral  Patient has been referred for addiction treatment: Pt. refused referral  Smart, Idris Edmundson LCSW 02/15/2016, 10:00 AM

## 2016-02-15 NOTE — Progress Notes (Signed)
Pt spent the shift in bed   Had minimal response when staff questioned her    She does not appear to be in any distress and did not require medications   Q 15 min checks    Pt remains safe
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
## 2016-02-15 NOTE — Progress Notes (Signed)
Discharge note:  Patient discharged home per MD order.  Reviewed AVS/discharge/follow up appointments/prescriptions/medications with patient and she indicated understanding.  Patient denies SI/HI/AVH.  Patient left ambulatory for the bus station.  Patient left in good spirits.

## 2016-02-15 NOTE — [facility] Suicide Risk Assessment (Signed)
[facility] INPATIENT:  Family/Significant Other Suicide Prevention Education  Suicide Prevention Education:  Patient Refusal for Family/Significant Other Suicide Prevention Education: The patient Stacy Peterson has refused to provide written consent for family/significant other to be provided Family/Significant Other Suicide Prevention Education during admission and/or prior to discharge.  Physician notified.  SPE completed with pt, as pt refused to consent to family contact. SPI pamphlet provided to pt and pt was encouraged to share information with support network, ask questions, and talk about any concerns relating to SPE. Pt denies access to guns/firearms and verbalized understanding of information provided. Mobile Crisis information also provided to pt.   Smart, Eulogia Dismore  LCSW  02/15/2016, 10:00 AM
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
## 2016-02-27 ENCOUNTER — Encounter (HOSPITAL_COMMUNITY): Payer: Self-pay | Admitting: Nurse Practitioner

## 2016-02-27 ENCOUNTER — Emergency Department (HOSPITAL_COMMUNITY)
Admission: EM | Admit: 2016-02-27 | Discharge: 2016-02-27 | Disposition: A | Discharge status: Home / Self Care | Payer: MEDICAID | Attending: Emergency Medicine | Admitting: Emergency Medicine

## 2016-02-27 DIAGNOSIS — Z8619 Personal history of other infectious and parasitic diseases: Secondary | ICD-10-CM | POA: Insufficient documentation

## 2016-02-27 DIAGNOSIS — R233 Spontaneous ecchymoses: Secondary | ICD-10-CM

## 2016-02-27 DIAGNOSIS — R238 Other skin changes: Secondary | ICD-10-CM

## 2016-02-27 DIAGNOSIS — R109 Unspecified abdominal pain: Secondary | ICD-10-CM | POA: Insufficient documentation

## 2016-02-27 DIAGNOSIS — M7981 Nontraumatic hematoma of soft tissue: Secondary | ICD-10-CM | POA: Insufficient documentation

## 2016-02-27 DIAGNOSIS — Z3202 Encounter for pregnancy test, result negative: Secondary | ICD-10-CM | POA: Insufficient documentation

## 2016-02-27 DIAGNOSIS — Z792 Long term (current) use of antibiotics: Secondary | ICD-10-CM | POA: Insufficient documentation

## 2016-02-27 DIAGNOSIS — Z79899 Other long term (current) drug therapy: Secondary | ICD-10-CM | POA: Insufficient documentation

## 2016-02-27 DIAGNOSIS — F209 Schizophrenia, unspecified: Secondary | ICD-10-CM | POA: Insufficient documentation

## 2016-02-27 DIAGNOSIS — F319 Bipolar disorder, unspecified: Secondary | ICD-10-CM | POA: Insufficient documentation

## 2016-02-27 DIAGNOSIS — F17210 Nicotine dependence, cigarettes, uncomplicated: Secondary | ICD-10-CM | POA: Insufficient documentation

## 2016-02-27 DIAGNOSIS — R34 Anuria and oliguria: Secondary | ICD-10-CM | POA: Insufficient documentation

## 2016-02-27 LAB — CBC
HCT: 33.6 % — ABNORMAL LOW (ref 36.0–46.0)
HEMOGLOBIN: 10.8 g/dL — AB (ref 12.0–15.0)
MCH: 27.8 pg (ref 26.0–34.0)
MCHC: 32.1 g/dL (ref 30.0–36.0)
MCV: 86.4 fL (ref 78.0–100.0)
Platelets: 319 10*3/uL (ref 150–400)
RBC: 3.89 MIL/uL (ref 3.87–5.11)
RDW: 16.4 % — ABNORMAL HIGH (ref 11.5–15.5)
WBC: 8.6 10*3/uL (ref 4.0–10.5)

## 2016-02-27 LAB — BASIC METABOLIC PANEL
ANION GAP: 10 (ref 5–15)
BUN: 12 mg/dL (ref 6–20)
CHLORIDE: 105 mmol/L (ref 101–111)
CO2: 22 mmol/L (ref 22–32)
Calcium: 8.8 mg/dL — ABNORMAL LOW (ref 8.9–10.3)
Creatinine, Ser: 0.62 mg/dL (ref 0.44–1.00)
GFR calc non Af Amer: >60 mL/min (ref >60)
Glucose, Bld: 98 mg/dL (ref 65–99)
POTASSIUM: 3.5 mmol/L (ref 3.5–5.1)
SODIUM: 137 mmol/L (ref 135–145)

## 2016-02-27 LAB — URINALYSIS, ROUTINE W REFLEX MICROSCOPIC
Bilirubin Urine: NEGATIVE
Glucose, UA: NEGATIVE mg/dL
Ketones, ur: NEGATIVE mg/dL
Leukocytes, UA: NEGATIVE
NITRITE: NEGATIVE
Protein, ur: NEGATIVE mg/dL
SPECIFIC GRAVITY, URINE: 1.024 (ref 1.005–1.030)
pH: 6 (ref 5.0–8.0)

## 2016-02-27 LAB — URINE MICROSCOPIC-ADD ON

## 2016-02-27 LAB — I-STAT BETA HCG BLOOD, ED (MC, WL, AP ONLY)

## 2016-02-27 LAB — TSH: TSH: 0.636 u[IU]/mL (ref 0.350–4.500)

## 2016-02-27 NOTE — ED Notes (Signed)
Pt reportedly "drank to much last night, although she is not supposed to drink because she has Kidney problems."chart review does not endorse her hx of such, also states her face/eyes are swollen, and she has a severe headache with lower abdominal pain.

## 2016-02-27 NOTE — ED Notes (Signed)
Pt stated unable to give urine sample at this time 

## 2016-02-27 NOTE — Discharge Instructions (Signed)
Your urine is negative for infection.  Your thyroid function is normal.  Your platelets are normal.  Follow up with your family doctor.  Two possibilities are provided for you.

## 2016-02-27 NOTE — ED Provider Notes (Signed)
CSN: [id number]     Arrival date & time 02/27/16  1431 History   First MD Initiated Contact with Patient 02/27/16 1633     Chief Complaint  Patient presents with  . Urinary Retention  . Abdominal Pain  . Bruising Easily      (Consider location/radiation/quality/duration/timing/severity/associated sxs/prior Treatment) Patient is a 25 y.o. female presenting with abdominal pain. The history is provided by the patient.  Abdominal Pain Pain location:  Suprapubic Pain quality: aching   Pain radiates to:  Does not radiate Pain severity:  No pain Onset quality:  Sudden Timing:  Constant Progression:  Worsening Chronicity:  New Relieved by:  Nothing Worsened by:  Nothing tried Ineffective treatments:  None tried Associated symptoms: no chest pain, no chills, no dysuria, no fever, no nausea, no shortness of breath and no vomiting    25 yo F with a chief complaint of decreased urine. States she has not P in the past couple days. Went on drinking binge last night and is concerned because she feels like she has issues with her kidneys. Also noted that she had some bruises this morning is unsure where they came from. Denies any trauma.  Past Medical History  Diagnosis Date  . Shingles   . Trichomonas vaginitis   . Schizophrenia (HCC)   . Bipolar 1 disorder [facility](HCC)    Past Surgical History  Procedure Laterality Date  . Knee surgery    . Knee surgery     Family History  Problem Relation Age of Onset  . Diabetes Paternal Grandmother   . Diabetes Paternal Grandfather    Social History  Substance Use Topics  . Smoking status: Current Every Day Smoker -- 0.50 packs/day    Types: Cigarettes  . Smokeless tobacco: Never Used  . Alcohol Use: 7.2 oz/week    12 Cans of beer per week   OB History    Gravida Para Term Preterm AB TAB SAB Ectopic Multiple Living   4 3 3  1  0 1   3     Review of Systems  Constitutional: Negative for fever and chills.  HENT: Negative for congestion and  rhinorrhea.   Eyes: Negative for redness and visual disturbance.  Respiratory: Negative for shortness of breath and wheezing.   Cardiovascular: Negative for chest pain and palpitations.  Gastrointestinal: Positive for abdominal pain. Negative for nausea and vomiting.  Genitourinary: Negative for dysuria and urgency.  Musculoskeletal: Negative for myalgias and arthralgias.  Skin: Negative for pallor and wound.  Neurological: Negative for dizziness and headaches.      Allergies  Review of patient's allergies indicates no known allergies.  Home Medications   Prior to Admission medications   Medication Sig Start Date End Date Taking? Authorizing Provider  amoxicillin-clavulanate (AUGMENTIN) 875-125 MG tablet Take 1 tablet by mouth every 12 (twelve) hours. For infection 02/15/16   Sanjuana KavaAgnes I Nwoko, NP  OLANZapine (ZYPREXA) 10 MG tablet Take 1 tablet (10 mg total) by mouth 2 (two) times daily. For mood control 02/15/16   Sanjuana KavaAgnes I Nwoko, NP  Oxcarbazepine (TRILEPTAL) 300 MG tablet Take 1 tablet (300 mg total) by mouth 2 (two) times daily. For mood stabilization 02/15/16   Sanjuana KavaAgnes I Nwoko, NP  traZODone (DESYREL) 50 MG tablet Take 1 tablet (50 mg total) by mouth at bedtime as needed for sleep (may repeat X 1). For insomnia 02/15/16   Sanjuana KavaAgnes I Nwoko, NP   BP 107/61 mmHg  Pulse 70  Temp(Src) 97.5 F (36.4 C) (Oral)  Resp 17  SpO2 99%  LMP 02/24/2016 (LMP Unknown)  Breastfeeding? No Physical Exam  Constitutional: She is oriented to person, place, and time. She appears well-developed and well-nourished. No distress.  HENT:  Head: Normocephalic and atraumatic.  Eyes: EOM are normal. Pupils are equal, round, and reactive to light.  Neck: Normal range of motion. Neck supple.  Cardiovascular: Normal rate and regular rhythm.  Exam reveals no gallop and no friction rub.   No murmur heard. Pulmonary/Chest: Effort normal. She has no wheezes. She has no rales.  Abdominal: Soft. She exhibits no distension.  There is no tenderness. There is no rebound and no guarding.  Musculoskeletal: She exhibits no edema or tenderness.  Neurological: She is alert and oriented to person, place, and time.  Skin: Skin is warm and dry. She is not diaphoretic.  Mild bruising on extensor surfaces of arms and legs  Psychiatric: She has a normal mood and affect. Her behavior is normal.  Nursing note and vitals reviewed.   ED Course  Procedures (including critical care time) Labs Review Labs Reviewed  URINALYSIS, ROUTINE W REFLEX MICROSCOPIC (NOT AT [facility]) - Abnormal; Notable for the following:    Hgb urine dipstick LARGE (*)    All other components within normal limits  CBC - Abnormal; Notable for the following:    Hemoglobin 10.8 (*)    HCT 33.6 (*)    RDW 16.4 (*)    All other components within normal limits  BASIC METABOLIC PANEL - Abnormal; Notable for the following:    Calcium 8.8 (*)    All other components within normal limits  URINE MICROSCOPIC-ADD ON - Abnormal; Notable for the following:    Squamous Epithelial / LPF 0-5 (*)    Bacteria, UA RARE (*)    All other components within normal limits  TSH  I-STAT BETA HCG BLOOD, ED ([facility], [facility], [facility] ONLY)    Imaging Review No results found. I have personally reviewed and evaluated these images and lab results as part of my medical decision-making.   EKG Interpretation None      MDM   Final diagnoses:  Bruises easily  Decreased urine volume    25 yo F with a chief complaint of easy bruising and decreased urine output. Patient's UA was negative for infection. No signs of bladder obstruction. Platelets were normal. Hemoglobin is normal. Discharge home.  7:45 PM:  I have discussed the diagnosis/risks/treatment options with the patient and believe the pt to be eligible for discharge home to follow-up with PCP. We also discussed returning to the ED immediately if new or worsening sx occur. We discussed the sx which are most concerning (e.g., sudden  worsening pain, fever, inability to tolerate by mouth) that necessitate immediate return. Medications administered to the patient during their visit and any new prescriptions provided to the patient are listed below.  Medications given during this visit Medications - No data to display  New Prescriptions   No medications on file    The patient appears reasonably screen and/or stabilized for discharge and I doubt any other medical condition or other [facility] requiring further screening, evaluation, or treatment in the ED at this time prior to discharge.      Melene Plan, DO 02/27/16 1945

## 2016-07-06 ENCOUNTER — Encounter: Payer: Self-pay | Admitting: Emergency Medicine

## 2016-07-06 ENCOUNTER — Emergency Department: Payer: Self-pay

## 2016-07-06 ENCOUNTER — Emergency Department
Admission: EM | Admit: 2016-07-06 | Discharge: 2016-07-06 | Disposition: A | Discharge status: Home / Self Care | Payer: Self-pay | Attending: Emergency Medicine | Admitting: Emergency Medicine

## 2016-07-06 DIAGNOSIS — Z3491 Encounter for supervision of normal pregnancy, unspecified, first trimester: Secondary | ICD-10-CM | POA: Insufficient documentation

## 2016-07-06 DIAGNOSIS — F17210 Nicotine dependence, cigarettes, uncomplicated: Secondary | ICD-10-CM | POA: Insufficient documentation

## 2016-07-06 DIAGNOSIS — Z3A10 10 weeks gestation of pregnancy: Secondary | ICD-10-CM | POA: Insufficient documentation

## 2016-07-06 DIAGNOSIS — F250 Schizoaffective disorder, bipolar type: Secondary | ICD-10-CM | POA: Insufficient documentation

## 2016-07-06 DIAGNOSIS — Z369 Encounter for antenatal screening, unspecified: Secondary | ICD-10-CM

## 2016-07-06 LAB — COMPREHENSIVE METABOLIC PANEL
ALBUMIN: 4.4 g/dL (ref 3.5–5.0)
ALT: 14 U/L (ref 14–54)
AST: 18 U/L (ref 15–41)
Alkaline Phosphatase: 54 U/L (ref 38–126)
Anion gap: 7 (ref 5–15)
BUN: 7 mg/dL (ref 6–20)
CHLORIDE: 103 mmol/L (ref 101–111)
CO2: 24 mmol/L (ref 22–32)
CREATININE: 0.48 mg/dL (ref 0.44–1.00)
Calcium: 9.5 mg/dL (ref 8.9–10.3)
GFR calc Af Amer: >60 mL/min (ref >60)
GFR calc non Af Amer: >60 mL/min (ref >60)
GLUCOSE: 93 mg/dL (ref 65–99)
Potassium: 3.7 mmol/L (ref 3.5–5.1)
SODIUM: 134 mmol/L — AB (ref 135–145)
Total Bilirubin: 0.7 mg/dL (ref 0.3–1.2)
Total Protein: 7.8 g/dL (ref 6.5–8.1)

## 2016-07-06 LAB — CBC
HEMATOCRIT: 37.3 % (ref 35.0–47.0)
Hemoglobin: 12.6 g/dL (ref 12.0–16.0)
MCH: 28.9 pg (ref 26.0–34.0)
MCHC: 33.7 g/dL (ref 32.0–36.0)
MCV: 86 fL (ref 80.0–100.0)
PLATELETS: 199 10*3/uL (ref 150–440)
RBC: 4.34 MIL/uL (ref 3.80–5.20)
RDW: 18.8 % — AB (ref 11.5–14.5)
WBC: 8.1 10*3/uL (ref 3.6–11.0)

## 2016-07-06 LAB — URINALYSIS COMPLETE WITH MICROSCOPIC (ARMC ONLY)
BACTERIA UA: NONE SEEN
BILIRUBIN URINE: NEGATIVE
GLUCOSE, UA: NEGATIVE mg/dL
Hgb urine dipstick: NEGATIVE
Leukocytes, UA: NEGATIVE
Nitrite: NEGATIVE
PH: 8 (ref 5.0–8.0)
Protein, ur: 30 mg/dL — AB
Specific Gravity, Urine: 1.023 (ref 1.005–1.030)

## 2016-07-06 LAB — HCG, QUANTITATIVE, PREGNANCY: HCG, BETA CHAIN, QUANT, S: 233808 m[IU]/mL — AB (ref <5)

## 2016-07-06 LAB — TYPE AND SCREEN
ABO/RH(D): O POS
Antibody Screen: NEGATIVE

## 2016-07-06 LAB — LIPASE, BLOOD: LIPASE: 19 U/L (ref 11–51)

## 2016-07-06 MED ORDER — ONDANSETRON HCL 4 MG/2ML IJ SOLN
INTRAMUSCULAR | Status: AC
  Administered 2016-07-06: 4 mg via INTRAVENOUS
  Filled 2016-07-06: qty 2

## 2016-07-06 MED ORDER — SODIUM CHLORIDE 0.9 % IV BOLUS (SEPSIS)
1000.0000 mL | Freq: Once | INTRAVENOUS | Status: AC
Start: 2016-07-06 — End: 2016-07-06
  Administered 2016-07-06: 1000 mL via INTRAVENOUS

## 2016-07-06 MED ORDER — ONDANSETRON HCL 4 MG/2ML IJ SOLN
4.0000 mg | Freq: Once | INTRAMUSCULAR | Status: AC
  Administered 2016-07-06: 4 mg via INTRAVENOUS

## 2016-07-06 MED ORDER — PROMETHAZINE HCL 12.5 MG PO TABS: 12.5000 mg | ORAL_TABLET | Freq: Four times a day (QID) | ORAL | Status: DC | PRN

## 2016-07-06 NOTE — ED Provider Notes (Signed)
Time Seen: Approximately 1752 I have reviewed the triage notes  Chief Complaint: Emesis; Abdominal Pain; and Diarrhea   History of Present Illness: Stacy Peterson is a 25 y.o. female *with history of being gravida 4. 2 with one previous stillborn delivery. She states she is approximately [redacted] weeks pregnant based on home pregnancy test and hasn't followed up with an OB/GYN. Patient presents with complaints of nausea, vomiting, loose stool. She denies any persistent vaginal bleeding and has small amount of spotty bleeding earlier today. She denies any urinary complaints. Outside of the stillborn delivery she denies any complications from her other pregnancies such as diabetes.   Past Medical History  Diagnosis Date  . Shingles   . Trichomonas vaginitis   . Schizophrenia (HCC)   . Bipolar 1 disorder [facility](HCC)     Patient Active Problem List   Diagnosis Date Noted  . Recurrent major depression (HCC) 02/11/2016  . Depression, major, recurrent, 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/11/2016  . PTSD (post-traumatic stress disorder) 02/11/2016  . Polysubstance dependence (HCC) 02/11/2016  . Intentional overdose of drug in tablet form (HCC)   . Schizoaffective disorder, bipolar type (HCC) 01/17/2016  . Drug overdose, intentional (HCC) 01/17/2016  . 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 Left-sided low back pain with sciatica 04/30/2014    Past Surgical History  Procedure Laterality Date  . Knee surgery    . Knee surgery      Past Surgical History  Procedure Laterality Date  . Knee surgery    . Knee surgery      Current Outpatient Rx  Name  Route  Sig  Dispense  Refill  . amoxicillin-clavulanate (AUGMENTIN) 875-125 MG tablet   Oral   Take 1 tablet by mouth every 12 (twelve) hours. For infection   14 tablet   0   . OLANZapine (ZYPREXA) 10 MG tablet   Oral   Take 1 tablet (10 mg total) by mouth 2 (two) times daily. For mood control   60 tablet   0   . Oxcarbazepine (TRILEPTAL) 300 MG  tablet   Oral   Take 1 tablet (300 mg total) by mouth 2 (two) times daily. For mood stabilization   60 tablet   0   . traZODone (DESYREL) 50 MG tablet   Oral   Take 1 tablet (50 mg total) by mouth at bedtime as needed for sleep (may repeat X 1). For insomnia   60 tablet   0     Allergies:  Review of patient's allergies indicates no known allergies.  Family History: Family History  Problem Relation Age of Onset  . Diabetes Paternal Grandmother   . Diabetes Paternal Grandfather     Social History: Social History  Substance Use Topics  . Smoking status: Current Every Day Smoker -- 0.50 packs/day    Types: Cigarettes  . Smokeless tobacco: Never Used  . Alcohol Use: 7.2 oz/week    12 Cans of beer per week     Review of Systems:   10 point review of systems was performed and was otherwise negative:  Constitutional: No fever Eyes: No visual disturbances ENT: No sore throat, ear pain Cardiac: No chest pain Respiratory: No shortness of breath, wheezing, or stridor Abdomen: Lower suprapubic and slightly toward the left lower quadrant. Persistent nausea without any hematemesis or biliary emesis Endocrine: No weight loss, No night sweats Extremities: No peripheral edema, cyanosis Skin: No rashes, easy bruising Neurologic: No focal weakness, trouble with speech or swollowing Urologic: No dysuria,  Hematuria, or urinary frequency   Physical Exam:  ED Triage Vitals  Enc Vitals Group     BP 07/06/16 1600 114/75 mmHg     Pulse Rate 07/06/16 1600 68     Resp 07/06/16 1600 18     Temp 07/06/16 1600 98.5 F (36.9 C)     Temp Source 07/06/16 1600 Oral     SpO2 07/06/16 1600 100 %     Weight 07/06/16 1600 134 lb (60.782 kg)     Height 07/06/16 1600 5\' 5"  (1.651 m)     Head Cir --      Peak Flow --      Pain Score 07/06/16 1604 7     Pain Loc --      Pain Edu? --      Excl. in GC? --     General: Awake , Alert , and Oriented times 3; GCS 15 Head: Normal cephalic ,  atraumatic Eyes: Pupils equal , round, reactive to light Nose/Throat: No nasal drainage, patent upper airway without erythema or exudate.  Neck: Supple, Full range of motion, No anterior adenopathy or palpable thyroid masses Lungs: Clear to ascultation without wheezes , rhonchi, or rales Heart: Regular rate, regular rhythm without murmurs , gallops , or rubs Abdomen: Soft, non tender without rebound, guarding , or rigidity; bowel sounds positive and symmetric in all 4 quadrants. No organomegaly .        Extremities: 2 plus symmetric pulses. No edema, clubbing or cyanosis Neurologic: normal ambulation, Motor symmetric without deficits, sensory intact Skin: warm, dry, no rashes   Labs:   All laboratory work was reviewed including any pertinent negatives or positives listed below:  Labs Reviewed  COMPREHENSIVE METABOLIC PANEL - Abnormal; Notable for the following:    Sodium 134 (*)    All other components within normal limits  CBC - Abnormal; Notable for the following:    RDW 18.8 (*)    All other components within normal limits  URINALYSIS COMPLETEWITH MICROSCOPIC ([facility] ONLY) - Abnormal; Notable for the following:    Color, Urine YELLOW (*)    APPearance CLEAR (*)    Ketones, ur 2+ (*)    Protein, ur 30 (*)    Squamous Epithelial / LPF 6-30 (*)    All other components within normal limits  LIPASE, BLOOD  HCG, QUANTITATIVE, PREGNANCY  POC URINE PREG, ED  POC URINE PREG, ED  Laboratory work was reviewed and showed no clinically significant abnormalities.   Radiology: *       US OB Comp Less 14 Wks (Final result) Result time: 07/06/16 19:06:36   Final result by Rad Results In Interface (07/06/16 19:06:36)   Narrative:   CLINICAL DATA: 25 year old with LMP 06/03/2016 (4 weeks 5 days), presenting with a 3 day history of lower abdominal pain/ pelvic pain and vaginal spotting. Quantitative beta HCG [id number]. EDC by LMP 03/10/2017.  EXAM: OBSTETRIC <14 WK  ULTRASOUND  TECHNIQUE: Transabdominal ultrasound was performed for evaluation of the gestation as well as the maternal uterus and adnexal regions.  COMPARISON: None this gestation.  FINDINGS: Intrauterine gestational sac: Single.  Yolk sac: Visualized.  Embryo: Visualized. 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  Cardiac Activity: Visualized.  Heart Rate: 163 bpm  CRL:  40 mm  10 w 6 d         [place] EDC: 01/26/2017.  Subchorionic hemorrhage: Absent.  Maternal uterus/adnexae: Normal-appearing ovaries bilaterally, the right measuring approximately 2.6 x 1.6 x 2.0 cm on the left approximately 3.7 x  1.8 x 2.8 cm. Likely complex corpus luteum cyst within the left ovary with peripheral color Doppler flow.  IMPRESSION: 1. Single live intrauterine fetus with estimated gestational age [redacted] weeks 6 days by crown-rump length, inconsistent with the gestational age by LMP. Ultrasound [facility] 01/26/2017. 2. 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 Followup obstetrical ultrasound scratch they close follow-up obstetrical ultrasound is recommended to confirm or deny this finding. 3. Normal-appearing ovaries with a likely complex corpus luteum cyst within the left ovary.   Electronically Signed By: Hulan Saas M.D. On: 07/06/2016 19:06              I personally reviewed the radiologic studies     ED Course: * Patient's stay was uneventful here and appears to have some nausea vomiting associated with her pregnancy along with a left ovarian corpus luteal cyst which is likely creating her left lower quadrant abdominal pain. No evidence for ectopic pregnancy in her pregnancy appears to be proceeding normally. She was referred to OB/GYN unassigned for further follow-up and repeat ultrasound   Assessment:  First trimester abdominal pain     Plan: * Outpatient Prescription for Phenergan for nausea and vomiting Patient was advised to return immediately if condition worsens.  Patient was advised to follow up with their primary care physician or other specialized physicians involved in their outpatient care. The patient and/or family member/power of attorney had laboratory results reviewed at the bedside. All questions and concerns were addressed and appropriate discharge instructions were distributed by the nursing staff.            Jennye Moccasin, MD 07/06/16 [phone]

## 2016-07-06 NOTE — ED Notes (Signed)
Pt presents to ED with reports of nausea, vomiting and diarrhea for approximately one week. Pt reports extreme exhaustion and abdominal pain. Pt reports her urine output has decreased. Pt states she is not able to keep anything down. Pt states she is approximately [redacted] weeks pregnant but has not seen an OB yet. Pt reports brief vaginal spotting yesterday.

## 2016-07-06 NOTE — ED Notes (Signed)
POCT urine pregnancy positive.

## 2016-07-18 ENCOUNTER — Encounter (HOSPITAL_COMMUNITY): Payer: Self-pay | Admitting: Emergency Medicine

## 2016-07-18 DIAGNOSIS — N898 Other specified noninflammatory disorders of vagina: Secondary | ICD-10-CM | POA: Insufficient documentation

## 2016-07-18 DIAGNOSIS — Z79899 Other long term (current) drug therapy: Secondary | ICD-10-CM | POA: Insufficient documentation

## 2016-07-18 DIAGNOSIS — F17210 Nicotine dependence, cigarettes, uncomplicated: Secondary | ICD-10-CM | POA: Insufficient documentation

## 2016-07-18 LAB — URINALYSIS, ROUTINE W REFLEX MICROSCOPIC
Bilirubin Urine: NEGATIVE
Glucose, UA: NEGATIVE mg/dL
Hgb urine dipstick: NEGATIVE
NITRITE: NEGATIVE
PROTEIN: NEGATIVE mg/dL
Specific Gravity, Urine: 1.026 (ref 1.005–1.030)
pH: 6 (ref 5.0–8.0)

## 2016-07-18 LAB — URINE MICROSCOPIC-ADD ON: RBC / HPF: NONE SEEN RBC/hpf (ref 0–5)

## 2016-07-18 NOTE — ED Triage Notes (Signed)
Pt. reports vaginal irritation , itching and vaginal discharge onset last night after sexual encounter with boyfriend , she is [redacted] weeks pregnant ( G3P2) , denies vaginal bleeding or contractions .

## 2016-07-19 ENCOUNTER — Emergency Department (HOSPITAL_COMMUNITY)
Admission: EM | Admit: 2016-07-19 | Discharge: 2016-07-19 | Discharge status: Against Medical Advice | Payer: Self-pay | Attending: Emergency Medicine | Admitting: Emergency Medicine

## 2016-07-19 DIAGNOSIS — N949 Unspecified condition associated with female genital organs and menstrual cycle: Secondary | ICD-10-CM

## 2016-07-19 MED ORDER — SODIUM CHLORIDE 0.9 % IV BOLUS (SEPSIS): 2000.0000 mL | Freq: Once | INTRAVENOUS | Status: DC

## 2016-07-19 NOTE — ED Notes (Signed)
RN present during PA [facility]) interview exam; PA began discussion asking about symptoms which brought her here to the hospital. Discussion elevated when PA asked patient about prenatal care and OB/GYN follow-up; pt stated that she didn't have an OB/GYN d/t a recent move and does not have medicaid; pt then began to get upset and began yelling at RN and PA in room "You dont even know if I am going to keep this baby!" and  "thats none of your business!"; PA asked if patient still wanted to be treated for todays symptoms, patient stated "no! Get the f**k out of my room!" pt then got dressed and left with visitor outside of room AMA; unable to obtain AMA signatures; Risks of leaving AMA were not discussed with patient prior to departure from ER; pt ambulatory from room to lobby, steady gait noted

## 2016-07-19 NOTE — ED Provider Notes (Signed)
[facility]-EMERGENCY DEPT Provider Note   CSN: [id number] Arrival date & time: 07/18/16  2101  First Provider Contact:  First MD Initiated Contact with Patient 07/19/16 0203        History   Chief Complaint Chief Complaint  Patient presents with  . Vaginal Discharge    HPI Stacy Peterson is a 26 y.o. female.  Stacy Peterson is a 25 y.o. female  with a hx of Bipolar, schizophrenia, shingles, Trichomonas presents to the Emergency Department complaining of gradual, persistent, progressively worsening vaginal burning onset this morning.  She also reports persistent nausea and vomiting for "weeks."  Pt has not had any prenatal care and according to her has not attempted to seek this.  She reports no treatments for her nausea and vomiting.  She is concerned she may have gonorrhea. Unknown aggravating or alleviating factors.    During the HPI pt became very agitated and angry.  She states "I don't have resources like you.  I don't have medicaid.  I just moved to [place]."  Pt began yelling "I'm not even keeping this baby" and demanded that I leave the room for her to get dressed.  She is unwilling to answer any further questions.      The history is provided by the patient and medical records. No language interpreter was used.    Past Medical History:  Diagnosis Date  . Bipolar 1 disorder (HCC)   . Schizophrenia (HCC)   . Shingles   . Trichomonas vaginitis     Patient Active Problem List   Diagnosis Date Noted  . Recurrent major depression (HCC) 02/11/2016  . Depression, major, recurrent, 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/11/2016  . PTSD (post-traumatic stress disorder) 02/11/2016  . Polysubstance dependence (HCC) 02/11/2016  . Intentional overdose of drug in tablet form (HCC)   . Schizoaffective disorder, bipolar type (HCC) 01/17/2016  . Drug overdose, intentional (HCC) 01/17/2016  . 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 Left-sided low back pain with sciatica 04/30/2014    Past Surgical  History:  Procedure Laterality Date  . KNEE SURGERY    . KNEE SURGERY      OB History    Gravida Para Term Preterm AB Living   5 3 3   1 3    SAB TAB Ectopic Multiple Live Births   1 0     3       Home Medications    Prior to Admission medications   Medication Sig Start Date End Date Taking? Authorizing Provider  amoxicillin-clavulanate (AUGMENTIN) 875-125 MG tablet Take 1 tablet by mouth every 12 (twelve) hours. For infection 02/15/16   Sanjuana Kava, NP  OLANZapine (ZYPREXA) 10 MG tablet Take 1 tablet (10 mg total) by mouth 2 (two) times daily. For mood control 02/15/16   Sanjuana Kava, NP  Oxcarbazepine (TRILEPTAL) 300 MG tablet Take 1 tablet (300 mg total) by mouth 2 (two) times daily. For mood stabilization 02/15/16   Sanjuana Kava, NP  promethazine (PHENERGAN) 12.5 MG tablet Take 1 tablet (12.5 mg total) by mouth every 6 (six) hours as needed for nausea or vomiting. 07/06/16   Jennye Moccasin, MD  traZODone (DESYREL) 50 MG tablet Take 1 tablet (50 mg total) by mouth at bedtime as needed for sleep (may repeat X 1). For insomnia 02/15/16   Sanjuana Kava, NP    Family History Family History  Problem Relation Age of Onset  . Diabetes Paternal Grandmother   . Diabetes Paternal Grandfather  Social History Social History  Substance Use Topics  . Smoking status: Current Every Day Smoker    Packs/day: 0.50    Types: Cigarettes  . Smokeless tobacco: Never Used  . Alcohol use 7.2 oz/week    12 Cans of beer per week     Allergies   Review of patient's allergies indicates no known allergies.   Review of Systems Review of Systems  Gastrointestinal: Positive for nausea and vomiting.  Genitourinary: Positive for vaginal discharge and vaginal pain.     Physical Exam Updated Vital Signs BP 119/70 (BP Location: Right Arm)   Pulse 75   Temp 98.5 F (36.9 C) (Oral)   Resp 20   Ht 5\' 5"  (1.651 m)   Wt 60.8 kg   LMP 06/03/2016   SpO2 99%   BMI 22.30 kg/m   Physical  Exam  Constitutional: She appears well-developed and well-nourished. She appears distressed (agitated, crying and yelling).  HENT:  Head: Normocephalic and atraumatic.  Eyes: Conjunctivae are normal.  Neck: Normal range of motion.  Pulmonary/Chest: Effort normal.  Musculoskeletal:  Moving all extremities without difficulty  Neurological: She is alert.  Skin: Skin is dry.  Psychiatric: Her affect is angry. She is agitated.     ED Treatments / Results  Labs (all labs ordered are listed, but only abnormal results are displayed) Labs Reviewed  URINALYSIS, ROUTINE W REFLEX MICROSCOPIC (NOT AT [facility]) - Abnormal; Notable for the following:       Result Value   APPearance TURBID (*)    Ketones, ur >80 (*)    Leukocytes, UA SMALL (*)    All other components within normal limits  URINE MICROSCOPIC-ADD ON - Abnormal; Notable for the following:    Squamous Epithelial / LPF 6-30 (*)    Bacteria, UA RARE (*)    All other components within normal limits  WET PREP, GENITAL  GC/CHLAMYDIA PROBE AMP ([place]) NOT AT [facility]    EKG  EKG Interpretation None       Radiology No results found.  Procedures Procedures (including critical care time)  Medications Ordered in ED Medications  sodium chloride 0.9 % bolus 2,000 mL (not administered)     Initial Impression / Assessment and Plan / ED Course  I have reviewed the triage vital signs and the nursing notes.  Pertinent labs & imaging results that were available during my care of the patient were reviewed by me and considered in my medical decision making (see chart for details).  Clinical Course  Value Comment By Time  Ketones, ur: (!) >80 Dehydration Dierdre Forth, PA-C 07/26 0203   Record review shows OB US on 07/06/16 with single living IUP.   Dahlia Client Eliah Marquard, PA-C 07/26 0203  WBC, UA: 6-30 Pt with vaginal burning, concern for possible UTI as she is also +leuks Dierdre Forth, PA-C 07/26 0238   Stacy Peterson  presents with c/o vaginal burning.  I reviewed her UA prior to history taking and expressed concern about dehydration and possible UTI with vaginal burning and keytones in her urine.  Pt became agitated during the HPI and refused treatment or exam.  I discussed my concerns and preference to exam and treat her however, she asked me to leave the room, got dressed and left without further discussion.    RN Caryl Bis was at the bedside throughout history taking and discussion.  She also attempted to calm patient and allow treatment without success.    Final Clinical Impressions(s) / ED Diagnoses  Final diagnoses:  Vaginal burning    New Prescriptions New Prescriptions   No medications on file     Dierdre Forth, PA-C 07/19/16 0241    Tomasita Crumble, MD 07/19/16 1436

## 2016-07-19 NOTE — ED Notes (Signed)
pts name called to recheck vitals no answer 

## 2016-08-27 ENCOUNTER — Inpatient Hospital Stay (HOSPITAL_COMMUNITY)
Admission: AD | Admit: 2016-08-27 | Discharge: 2016-08-27 | Disposition: A | Discharge status: Home / Self Care | Payer: Self-pay | Source: Ambulatory Visit | Attending: Obstetrics & Gynecology | Admitting: Obstetrics & Gynecology

## 2016-08-27 ENCOUNTER — Telehealth: Payer: Self-pay | Admitting: Nurse Practitioner

## 2016-08-27 ENCOUNTER — Encounter (HOSPITAL_COMMUNITY): Payer: Self-pay

## 2016-08-27 ENCOUNTER — Other Ambulatory Visit: Payer: Self-pay | Admitting: Nurse Practitioner

## 2016-08-27 DIAGNOSIS — N760 Acute vaginitis: Secondary | ICD-10-CM

## 2016-08-27 DIAGNOSIS — O99341 Other mental disorders complicating pregnancy, first trimester: Secondary | ICD-10-CM | POA: Insufficient documentation

## 2016-08-27 DIAGNOSIS — B3731 Acute candidiasis of vulva and vagina: Secondary | ICD-10-CM

## 2016-08-27 DIAGNOSIS — O99331 Smoking (tobacco) complicating pregnancy, first trimester: Secondary | ICD-10-CM | POA: Insufficient documentation

## 2016-08-27 DIAGNOSIS — B373 Candidiasis of vulva and vagina: Secondary | ICD-10-CM

## 2016-08-27 DIAGNOSIS — F209 Schizophrenia, unspecified: Secondary | ICD-10-CM | POA: Insufficient documentation

## 2016-08-27 DIAGNOSIS — F329 Major depressive disorder, single episode, unspecified: Secondary | ICD-10-CM | POA: Insufficient documentation

## 2016-08-27 DIAGNOSIS — B9689 Other specified bacterial agents as the cause of diseases classified elsewhere: Secondary | ICD-10-CM

## 2016-08-27 DIAGNOSIS — Z3A08 8 weeks gestation of pregnancy: Secondary | ICD-10-CM | POA: Insufficient documentation

## 2016-08-27 DIAGNOSIS — O98811 Other maternal infectious and parasitic diseases complicating pregnancy, first trimester: Secondary | ICD-10-CM | POA: Insufficient documentation

## 2016-08-27 DIAGNOSIS — F319 Bipolar disorder, unspecified: Secondary | ICD-10-CM | POA: Insufficient documentation

## 2016-08-27 HISTORY — DX: Major depressive disorder, single episode, unspecified: F32.9

## 2016-08-27 HISTORY — DX: Chlamydial infection, unspecified: A74.9

## 2016-08-27 HISTORY — DX: Depression, unspecified: F32.A

## 2016-08-27 LAB — URINALYSIS, ROUTINE W REFLEX MICROSCOPIC
Bilirubin Urine: NEGATIVE
GLUCOSE, UA: NEGATIVE mg/dL
Hgb urine dipstick: NEGATIVE
KETONES UR: NEGATIVE mg/dL
NITRITE: NEGATIVE
PH: 6.5 (ref 5.0–8.0)
Protein, ur: NEGATIVE mg/dL
SPECIFIC GRAVITY, URINE: 1.02 (ref 1.005–1.030)

## 2016-08-27 LAB — URINE MICROSCOPIC-ADD ON: RBC / HPF: NONE SEEN RBC/hpf (ref 0–5)

## 2016-08-27 LAB — POCT PREGNANCY, URINE: Preg Test, Ur: POSITIVE — AB

## 2016-08-27 LAB — WET PREP, GENITAL
SPERM: NONE SEEN
TRICH WET PREP: NONE SEEN

## 2016-08-27 MED ORDER — METRONIDAZOLE 500 MG PO TABS: 500.0000 mg | ORAL_TABLET | Freq: Two times a day (BID) | ORAL | 0 refills | Status: DC

## 2016-08-27 NOTE — Discharge Instructions (Signed)
Get Monistat vaginal yeast cream over the counter and use as directed  - 7 day cream recommended. No smoking, no drugs, no alcohol.   Take a prenatal vitamin one by mouth every day.   Eat small frequent snacks to avoid nausea.   Begin prenatal care as soon as possible.

## 2016-08-27 NOTE — Telephone Encounter (Signed)
Client called in - unable to pick up Metronidazole at the pharmacy where the prescription was eprescribed as the pharmacist is out all the holiday weekend.  [facility] [address]  [facility].  Pharmacy there is currently closed.  Nolene Bernheimerri Burleson, NP

## 2016-08-27 NOTE — MAU Note (Signed)
Onset of vaginal swelling this morning.

## 2016-08-27 NOTE — MAU Provider Note (Signed)
History     CSN: [id number]  Arrival date and time: 08/27/16 4098   First Provider Initiated Contact with Patient 08/27/16 1030      Chief Complaint  Patient presents with  . Vaginal Pain   HPI Stacy Peterson 25 y.o. [redacted]w[redacted]d  Comes to MAU with vulvar pain since awakening this morning.  Noticed when she was showering that the soap caused her pain in her vulva.  Had sex last night and states it was "normal" for her and not forceful or harsh.  Denies using any lubricants or creams in the area.  Unable to wear underwear or leggings.  Has a known pregnancy but has not yet started prenatal care.  OB History    Gravida Para Term Preterm AB Living   5 3 3   1 3    SAB TAB Ectopic Multiple Live Births   1 0     3      Past Medical History:  Diagnosis Date  . Bipolar 1 disorder (HCC)   . Chlamydia   . Depression   . Schizophrenia (HCC)   . Shingles   . Trichomonas vaginitis     Past Surgical History:  Procedure Laterality Date  . KNEE SURGERY    . KNEE SURGERY      Family History  Problem Relation Age of Onset  . Diabetes Paternal Grandmother   . Diabetes Paternal Grandfather     Social History  Substance Use Topics  . Smoking status: Current Every Day Smoker    Packs/day: 0.25    Types: Cigarettes  . Smokeless tobacco: Never Used  . Alcohol use No    Allergies: No Known Allergies  Prescriptions Prior to Admission  Medication Sig Dispense Refill Last Dose  . amoxicillin-clavulanate (AUGMENTIN) 875-125 MG tablet Take 1 tablet by mouth every 12 (twelve) hours. For infection (Patient not taking: Reported on 08/27/2016) 14 tablet 0 Not Taking at Unknown time  . OLANZapine (ZYPREXA) 10 MG tablet Take 1 tablet (10 mg total) by mouth 2 (two) times daily. For mood control (Patient not taking: Reported on 08/27/2016) 60 tablet 0 Not Taking at Unknown time  . Oxcarbazepine (TRILEPTAL) 300 MG tablet Take 1 tablet (300 mg total) by mouth 2 (two) times daily. For mood stabilization  (Patient not taking: Reported on 08/27/2016) 60 tablet 0 Not Taking at Unknown time  . promethazine (PHENERGAN) 12.5 MG tablet Take 1 tablet (12.5 mg total) by mouth every 6 (six) hours as needed for nausea or vomiting. (Patient not taking: Reported on 08/27/2016) 30 tablet 0 Not Taking at Unknown time  . traZODone (DESYREL) 50 MG tablet Take 1 tablet (50 mg total) by mouth at bedtime as needed for sleep (may repeat X 1). For insomnia (Patient not taking: Reported on 08/27/2016) 60 tablet 0 Not Taking at Unknown time    Review of Systems  Constitutional: Negative for fever.  Gastrointestinal: Negative for abdominal pain, nausea and vomiting.  Genitourinary:       No vaginal discharge. No vaginal bleeding. No dysuria. Swollen and tender vulva.   Physical Exam   Blood pressure 123/59, pulse 94, temperature 98.2 F (36.8 C), temperature source Oral, resp. rate 16, last menstrual period 06/29/2016.  Physical Exam  Nursing note and vitals reviewed. Constitutional: She is oriented to person, place, and time. She appears well-developed and well-nourished.  HENT:  Head: Normocephalic.  Eyes: EOM are normal.  Neck: Neck supple.  GI: Soft. There is no tenderness.  Genitourinary:  Genitourinary Comments:  Labia minora very pink and edematous - no lesions, no blisters.  No obvious vaginal discharge.  Vaginal swabs done.  Musculoskeletal: Normal range of motion.  Neurological: She is alert and oriented to person, place, and time.  Skin: Skin is warm and dry.  Psychiatric: She has a normal mood and affect.    MAU Course  Procedures Results for orders placed or performed during the hospital encounter of 08/27/16 (from the past 24 hour(s))  Urinalysis, Routine w reflex microscopic (not at [facility])     Status: Abnormal   Collection Time: 08/27/16 10:04 AM  Result Value Ref Range   Color, Urine YELLOW YELLOW   APPearance CLEAR CLEAR   Specific Gravity, Urine 1.020 1.005 - 1.030   pH 6.5 5.0 - 8.0    Glucose, UA NEGATIVE NEGATIVE mg/dL   Hgb urine dipstick NEGATIVE NEGATIVE   Bilirubin Urine NEGATIVE NEGATIVE   Ketones, ur NEGATIVE NEGATIVE mg/dL   Protein, ur NEGATIVE NEGATIVE mg/dL   Nitrite NEGATIVE NEGATIVE   Leukocytes, UA LARGE (A) NEGATIVE  Urine microscopic-add on     Status: Abnormal   Collection Time: 08/27/16 10:04 AM  Result Value Ref Range   Squamous Epithelial / LPF TOO NUMEROUS TO COUNT (A) NONE SEEN   WBC, UA 6-30 0 - 5 WBC/hpf   RBC / HPF NONE SEEN 0 - 5 RBC/hpf   Bacteria, UA MANY (A) NONE SEEN   Urine-Other MUCOUS PRESENT   Pregnancy, urine POC     Status: Abnormal   Collection Time: 08/27/16 10:07 AM  Result Value Ref Range   Preg Test, Ur POSITIVE (A) NEGATIVE  Wet prep, genital     Status: Abnormal   Collection Time: 08/27/16 10:25 AM  Result Value Ref Range   Yeast Wet Prep HPF POC PRESENT (A) NONE SEEN   Trich, Wet Prep NONE SEEN NONE SEEN   Clue Cells Wet Prep HPF POC PRESENT (A) NONE SEEN   WBC, Wet Prep HPF POC MODERATE (A) NONE SEEN   Sperm NONE SEEN     MDM Discussed plan of care with Dr. Erin FullingHarraway Smith  Assessment and Plan  Pregnancy - 8 weeks - not in prenatal care yet Vaginal yeast infection causing edema of labia minora Bacterial Vaginosis  Plan Will eprescribe Metronidazole for BV and then have client get OTC Monistat to treat the yeast infection and Hydrocortisone cream to use for the swelling No smoking, no drugs, no alcohol.  Take a prenatal vitamin one by mouth every day.  Eat small frequent snacks to avoid nausea.  Begin prenatal care as soon as possible.   Terri L Burleson 08/27/2016, 10:38 AM

## 2016-08-29 LAB — GC/CHLAMYDIA PROBE AMP (~~LOC~~) NOT AT ARMC
CHLAMYDIA, DNA PROBE: NEGATIVE
NEISSERIA GONORRHEA: NEGATIVE

## 2016-11-23 ENCOUNTER — Encounter (HOSPITAL_COMMUNITY): Payer: Self-pay

## 2016-11-23 ENCOUNTER — Inpatient Hospital Stay (HOSPITAL_COMMUNITY)
Admission: AD | Admit: 2016-11-23 | Discharge: 2016-11-23 | Disposition: A | Discharge status: Home / Self Care | Payer: Medicaid Other | Source: Ambulatory Visit | Attending: Obstetrics and Gynecology | Admitting: Obstetrics and Gynecology

## 2016-11-23 DIAGNOSIS — Z3689 Encounter for other specified antenatal screening: Secondary | ICD-10-CM | POA: Insufficient documentation

## 2016-11-23 DIAGNOSIS — Z3A30 30 weeks gestation of pregnancy: Secondary | ICD-10-CM | POA: Insufficient documentation

## 2016-11-23 DIAGNOSIS — Z363 Encounter for antenatal screening for malformations: Secondary | ICD-10-CM

## 2016-11-23 DIAGNOSIS — O99333 Smoking (tobacco) complicating pregnancy, third trimester: Secondary | ICD-10-CM | POA: Insufficient documentation

## 2016-11-23 DIAGNOSIS — R12 Heartburn: Secondary | ICD-10-CM | POA: Insufficient documentation

## 2016-11-23 DIAGNOSIS — F17210 Nicotine dependence, cigarettes, uncomplicated: Secondary | ICD-10-CM | POA: Insufficient documentation

## 2016-11-23 DIAGNOSIS — O26893 Other specified pregnancy related conditions, third trimester: Secondary | ICD-10-CM | POA: Diagnosis not present

## 2016-11-23 DIAGNOSIS — O09893 Supervision of other high risk pregnancies, third trimester: Secondary | ICD-10-CM

## 2016-11-23 DIAGNOSIS — R109 Unspecified abdominal pain: Secondary | ICD-10-CM | POA: Diagnosis present

## 2016-11-23 DIAGNOSIS — O09213 Supervision of pregnancy with history of pre-term labor, third trimester: Secondary | ICD-10-CM

## 2016-11-23 LAB — URINALYSIS, ROUTINE W REFLEX MICROSCOPIC
Bilirubin Urine: NEGATIVE
GLUCOSE, UA: NEGATIVE mg/dL
HGB URINE DIPSTICK: NEGATIVE
Ketones, ur: NEGATIVE mg/dL
Leukocytes, UA: NEGATIVE
Nitrite: NEGATIVE
Protein, ur: NEGATIVE mg/dL
SPECIFIC GRAVITY, URINE: 1.015 (ref 1.005–1.030)
pH: 7 (ref 5.0–8.0)

## 2016-11-23 LAB — COMPREHENSIVE METABOLIC PANEL
ALK PHOS: 91 U/L (ref 38–126)
ALT: 8 U/L — ABNORMAL LOW (ref 14–54)
AST: 14 U/L — AB (ref 15–41)
Albumin: 3.4 g/dL — ABNORMAL LOW (ref 3.5–5.0)
Anion gap: 11 (ref 5–15)
BILIRUBIN TOTAL: 0.2 mg/dL — AB (ref 0.3–1.2)
BUN: 6 mg/dL (ref 6–20)
CALCIUM: 9 mg/dL (ref 8.9–10.3)
CO2: 21 mmol/L — ABNORMAL LOW (ref 22–32)
Chloride: 104 mmol/L (ref 101–111)
Creatinine, Ser: 0.42 mg/dL — ABNORMAL LOW (ref 0.44–1.00)
GFR calc Af Amer: >60 mL/min (ref >60)
Glucose, Bld: 84 mg/dL (ref 65–99)
POTASSIUM: 3.8 mmol/L (ref 3.5–5.1)
Sodium: 136 mmol/L (ref 135–145)
Total Protein: 6.7 g/dL (ref 6.5–8.1)

## 2016-11-23 LAB — CBC
HEMATOCRIT: 29.3 % — AB (ref 36.0–46.0)
Hemoglobin: 9.9 g/dL — ABNORMAL LOW (ref 12.0–15.0)
MCH: 29 pg (ref 26.0–34.0)
MCHC: 33.8 g/dL (ref 30.0–36.0)
MCV: 85.9 fL (ref 78.0–100.0)
Platelets: 182 10*3/uL (ref 150–400)
RBC: 3.41 MIL/uL — ABNORMAL LOW (ref 3.87–5.11)
RDW: 15.8 % — AB (ref 11.5–15.5)
WBC: 8.6 10*3/uL (ref 4.0–10.5)

## 2016-11-23 LAB — LIPASE, BLOOD: Lipase: 20 U/L (ref 11–51)

## 2016-11-23 LAB — AMYLASE: AMYLASE: 63 U/L (ref 28–100)

## 2016-11-23 MED ORDER — PROMETHAZINE HCL 12.5 MG PO TABS: 12.5000 mg | ORAL_TABLET | Freq: Four times a day (QID) | ORAL | 0 refills | Status: DC | PRN

## 2016-11-23 MED ORDER — GI COCKTAIL ~~LOC~~
30.0000 mL | Freq: Once | ORAL | Status: AC
  Administered 2016-11-23: 30 mL via ORAL
  Filled 2016-11-23: qty 30

## 2016-11-23 MED ORDER — PANTOPRAZOLE SODIUM 40 MG PO TBEC: 40.0000 mg | DELAYED_RELEASE_TABLET | Freq: Every day | ORAL | 0 refills | Status: DC

## 2016-11-23 NOTE — Discharge Instructions (Signed)
Heartburn During Pregnancy Heartburn is a type of pain or discomfort that can happen in the throat or chest. It is often described as a burning sensation. Heartburn is common during pregnancy because:  A hormone (progesterone) that is released during pregnancy may relax the valve (lower esophageal sphincter, or LES) that separates the esophagus from the stomach. This allows stomach acid to move up into the esophagus, causing heartburn.  The uterus gets larger and pushes up on the stomach, which pushes more acid into the esophagus. This is especially true in the later stages of pregnancy. Heartburn usually goes away or gets better after giving birth. What are the causes? Heartburn is caused by stomach acid backing up into the esophagus (reflux). Reflux can be triggered by:  Changing hormone levels.  Large meals.  Certain foods and beverages, such as coffee, chocolate, onions, and peppermint.  Exercise.  Increased stomach acid production. What increases the risk? You are more likely to experience heartburn during pregnancy if you:  Had heartburn prior to becoming pregnant.  Have been pregnant more than once before.  Are overweight or obese. The likelihood that you will get heartburn also increases as you get farther along in your pregnancy, especially during the last trimester. What are the signs or symptoms? Symptoms of this condition include:  Burning pain in the chest or lower throat.  Bitter taste in the mouth.  Coughing.  Problems swallowing.  Vomiting.  Hoarse voice.  Asthma. Symptoms may get worse when you lie down or bend over. Symptoms are often worse at night. How is this diagnosed? This condition is diagnosed based on:  Your medical history.  Your symptoms.  Blood tests to check for a certain type of bacteria associated with heartburn.  Whether taking heartburn medicine relieves your symptoms.  Examination of the stomach and esophagus using a tube with  a light and camera on the end (endoscopy). How is this treated? Treatment varies depending on how severe your symptoms are. Your health care provider may recommend:  Over-the-counter medicines (antacids or acid reducers) for mild heartburn.  Prescription medicines to decrease stomach acid or to protect your stomach lining.  Certain changes in your diet.  Raising the head of your bed so it is higher than the foot of the bed. This helps prevent stomach acid from backing up into the esophagus when you are lying down. Follow these instructions at home: Eating and drinking  Do not drink alcohol during your pregnancy.  Identify foods and beverages that make your symptoms worse, and avoid them.  Beverages that you may want to avoid include:  Coffee and tea (with or without caffeine).  Energy drinks and sports drinks.  Carbonated drinks or sodas.  Citrus fruit juices.  Foods that you may want to avoid include:  Chocolate and cocoa.  Peppermint and mint flavorings.  Garlic, onions, and horseradish.  Spicy and acidic foods, including peppers, chili powder, curry powder, vinegar, hot sauces, and barbecue sauce.  Citrus fruits, such as oranges, lemons, and limes.  Tomato-based foods, such as red sauce, chili, and salsa.  Fried and fatty foods, such as donuts, french fries, potato chips, and high-fat dressings.  High-fat meats, such as hot dogs, cold cuts, sausage, ham, and bacon.  High-fat dairy items, such as whole milk, butter, and cheese.  Eat small, frequent meals instead of large meals.  Avoid drinking large amounts of liquid with your meals.  Avoid eating meals during the 2-3 hours before bedtime.  Avoid lying down right  after you eat.  Do not exercise right after you eat. Medicines  Take over-the-counter and prescription medicines only as told by your health care provider.  Do not take aspirin, ibuprofen, or other NSAIDs unless your health care provider tells  you to do that.  You may be instructed to avoid medicines that contain sodium bicarbonate. General instructions  If directed, raise the head of your bed about 6 inches (15 cm) by putting blocks under the legs. Sleeping with more pillows does not effectively relieve heartburn because it only changes the position of your head.  Do not use any products that contain nicotine or tobacco, such as cigarettes and e-cigarettes. If you need help quitting, ask your health care provider.  Wear loose-fitting clothing.  Try to reduce your stress, such as with yoga or meditation. If you need help managing stress, ask your health care provider.  Maintain a healthy weight. If you are overweight, work with your health care provider to safely lose weight.  Keep all follow-up visits as told by your health care provider. This is important. Contact a health care provider if:  You develop new symptoms.  Your symptoms do not improve with treatment.  You have unexplained weight loss.  You have difficulty swallowing.  You make loud sounds when you breathe (wheeze).  You have a cough that does not go away.  You have frequent heartburn for more than 2 weeks.  You have nausea or vomiting that does not get better with treatment.  You have pain in your abdomen. Get help right away if:  You have severe chest pain that spreads to your arm, neck, or jaw.  You feel sweaty, dizzy, or light-headed.  You have shortness of breath.  You have pain when swallowing.  You vomit, and your vomit looks like blood or coffee grounds.  Your stool is bloody or black. This information is not intended to replace advice given to you by your health care provider. Make sure you discuss any questions you have with your health care provider. Document Released: 12/08/2000 Document Revised: 08/28/2016 Document Reviewed: 08/28/2016 Elsevier Interactive Patient Education  2017 [employer]    Preterm Labor and Birth  Information The normal length of a pregnancy is 39-41 weeks. Preterm labor is when labor starts before 37 completed weeks of pregnancy. What are the risk factors for preterm labor? Preterm labor is more likely to occur in women who:  Have certain infections during pregnancy such as a bladder infection, sexually transmitted infection, or infection inside the uterus (chorioamnionitis).  Have a shorter-than-normal cervix.  Have gone into preterm labor before.  Have had surgery on their cervix.  Are younger than age 24 or older than age 24.  Are African American.  Are pregnant with twins or multiple babies (multiple gestation).  Take street drugs or smoke while pregnant.  Do not gain enough weight while pregnant.  Became pregnant shortly after having been pregnant. What are the symptoms of preterm labor? Symptoms of preterm labor include:  Cramps similar to those that can happen during a menstrual period. The cramps may happen with diarrhea.  Pain in the abdomen or lower back.  Regular uterine contractions that may feel like tightening of the abdomen.  A feeling of increased pressure in the pelvis.  Increased watery or bloody mucus discharge from the vagina.  Water breaking (ruptured amniotic sac). Why is it important to recognize signs of preterm labor? It is important to recognize signs of preterm labor because babies who  are born prematurely may not be fully developed. This can put them at an increased risk for:  Long-term (chronic) heart and lung problems.  Difficulty immediately after birth with regulating body systems, including blood sugar, body temperature, heart rate, and breathing rate.  Bleeding in the brain.  Cerebral palsy.  Learning difficulties.  Death. These risks are highest for babies who are born before 34 weeks of pregnancy. How is preterm labor treated? Treatment depends on the length of your pregnancy, your condition, and the health of your  baby. It may involve:  Having a stitch (suture) placed in your cervix to prevent your cervix from opening too early (cerclage).  Taking or being given medicines, such as:  Hormone medicines. These may be given early in pregnancy to help support the pregnancy.  Medicine to stop contractions.  Medicines to help mature the babys lungs. These may be prescribed if the risk of delivery is high.  Medicines to prevent your baby from developing cerebral palsy. If the labor happens before 34 weeks of pregnancy, you may need to stay in the hospital. What should I do if I think I am in preterm labor? If you think that you are going into preterm labor, call your health care provider right away. How can I prevent preterm labor in future pregnancies? To increase your chance of having a full-term pregnancy:  Do not use any tobacco products, such as cigarettes, chewing tobacco, and e-cigarettes. If you need help quitting, ask your health care provider.  Do not use street drugs or medicines that have not been prescribed to you during your pregnancy.  Talk with your health care provider before taking any herbal supplements, even if you have been taking them regularly.  Make sure you gain a healthy amount of weight during your pregnancy.  Watch for infection. If you think that you might have an infection, get it checked right away.  Make sure to tell your health care provider if you have gone into preterm labor before. This information is not intended to replace advice given to you by your health care provider. Make sure you discuss any questions you have with your health care provider. Document Released: 03/02/2004 Document Revised: 05/23/2016 Document Reviewed: 05/03/2016 Elsevier Interactive Patient Education  2017 [employer]

## 2016-11-23 NOTE — MAU Provider Note (Signed)
History     CSN: [id number]  Arrival date and time: 11/23/16 1916   First Provider Initiated Contact with Patient 11/23/16 2002      Chief Complaint  Patient presents with  . Abdominal Pain  . Heartburn   HPI Stacy Peterson is a 25 y.o. [phone] at 175w6d by 1st trimester ultrasound who presents with abdominal pain & heartburn. Patient reports issues with heartburn throughout pregnancy that she has been treating with milk & tums. States now the tums is not working. Reports epigastric pain & burning that is worse at night. Rates pain 9/10. Associated symptoms include nausea & RUQ pain that radiates to right shoulder. Denies lower abdominal pain, fever/chills, dysuria, vomiting, diarrhea, constipation, vaginal bleeding, vaginal discharge, or LOF. Positive fetal movement.   OB History    Gravida Para Term Preterm AB Living   5 4 3 1  0 3   SAB TAB Ectopic Multiple Live Births   0 0     3      Past Medical History:  Diagnosis Date  . Bipolar 1 disorder (HCC)   . Chlamydia   . Depression   . Schizophrenia (HCC)   . Shingles   . Trichomonas vaginitis     Past Surgical History:  Procedure Laterality Date  . KNEE SURGERY      Family History  Problem Relation Age of Onset  . Diabetes Paternal Grandmother   . Diabetes Paternal Grandfather     Social History  Substance Use Topics  . Smoking status: Current Every Day Smoker    Packs/day: 0.25    Types: Cigarettes  . Smokeless tobacco: Never Used  . Alcohol use No    Allergies: No Known Allergies  Prescriptions Prior to Admission  Medication Sig Dispense Refill Last Dose  . metroNIDAZOLE (FLAGYL) 500 MG tablet Take 1 tablet (500 mg total) by mouth 2 (two) times daily. No alcohol while taking this medication 14 tablet 0   . promethazine (PHENERGAN) 12.5 MG tablet Take 1 tablet (12.5 mg total) by mouth every 6 (six) hours as needed for nausea or vomiting. (Patient not taking: Reported on 08/27/2016) 30 tablet 0 Not Taking at  Unknown time    Review of Systems  Constitutional: Negative.   Gastrointestinal: Positive for heartburn and nausea. Negative for abdominal pain, constipation, diarrhea and vomiting.  Genitourinary: Negative.    Physical Exam   Blood pressure 113/74, pulse 68, temperature 98.6 F (37 C), resp. rate 18, height 5\' 5"  (1.651 m), weight 142 lb (64.4 kg), last menstrual period 06/29/2016, SpO2 99 %.  Physical Exam  Nursing note and vitals reviewed. Constitutional: She is oriented to person, place, and time. She appears well-developed and well-nourished. No distress.  HENT:  Head: Normocephalic and atraumatic.  Eyes: Conjunctivae are normal. Right eye exhibits no discharge. Left eye exhibits no discharge. No scleral icterus.  Neck: Normal range of motion.  Cardiovascular: Normal rate, regular rhythm and normal heart sounds.   No murmur heard. Respiratory: Effort normal and breath sounds normal. No respiratory distress. She has no wheezes.  GI: Soft. There is no tenderness. There is negative Murphy's sign.  Neurological: She is alert and oriented to person, place, and time.  Skin: Skin is warm and dry. She is not diaphoretic.  Psychiatric: She has a normal mood and affect. Her behavior is normal. Judgment and thought content normal.   Fetal Tracing:  Baseline: 130 Variability: moderate Accelerations: 15x15 Decelerations: none  Toco: none  MAU Course  Procedures Results  for orders placed or performed during the hospital encounter of 11/23/16 (from the past 24 hour(s))  Urinalysis, Routine w reflex microscopic (not at [facility])     Status: None   Collection Time: 11/23/16  7:35 PM  Result Value Ref Range   Color, Urine YELLOW YELLOW   APPearance CLEAR CLEAR   Specific Gravity, Urine 1.015 1.005 - 1.030   pH 7.0 5.0 - 8.0   Glucose, UA NEGATIVE NEGATIVE mg/dL   Hgb urine dipstick NEGATIVE NEGATIVE   Bilirubin Urine NEGATIVE NEGATIVE   Ketones, ur NEGATIVE NEGATIVE mg/dL   Protein,  ur NEGATIVE NEGATIVE mg/dL   Nitrite NEGATIVE NEGATIVE   Leukocytes, UA NEGATIVE NEGATIVE  CBC     Status: Abnormal   Collection Time: 11/23/16  8:21 PM  Result Value Ref Range   WBC 8.6 4.0 - 10.5 K/uL   RBC 3.41 (L) 3.87 - 5.11 MIL/uL   Hemoglobin 9.9 (L) 12.0 - 15.0 g/dL   HCT 16.129.3 (L) 09.636.0 - 04.546.0 %   MCV 85.9 78.0 - 100.0 fL   MCH 29.0 26.0 - 34.0 pg   MCHC 33.8 30.0 - 36.0 g/dL   RDW 40.915.8 (H) 81.111.5 - 91.415.5 %   Platelets 182 150 - 400 K/uL  Comprehensive metabolic panel     Status: Abnormal   Collection Time: 11/23/16  8:21 PM  Result Value Ref Range   Sodium 136 135 - 145 mmol/L   Potassium 3.8 3.5 - 5.1 mmol/L   Chloride 104 101 - 111 mmol/L   CO2 21 (L) 22 - 32 mmol/L   Glucose, Bld 84 65 - 99 mg/dL   BUN 6 6 - 20 mg/dL   Creatinine, Ser 7.820.42 (L) 0.44 - 1.00 mg/dL   Calcium 9.0 8.9 - 95.610.3 mg/dL   Total Protein 6.7 6.5 - 8.1 g/dL   Albumin 3.4 (L) 3.5 - 5.0 g/dL   AST 14 (L) 15 - 41 U/L   ALT 8 (L) 14 - 54 U/L   Alkaline Phosphatase 91 38 - 126 U/L   Total Bilirubin 0.2 (L) 0.3 - 1.2 mg/dL   GFR calc non Af Amer >60 >60 mL/min   GFR calc Af Amer >60 >60 mL/min   Anion gap 11 5 - 15  Amylase     Status: None   Collection Time: 11/23/16  8:21 PM  Result Value Ref Range   Amylase 63 28 - 100 U/L  Lipase, blood     Status: None   Collection Time: 11/23/16  8:21 PM  Result Value Ref Range   Lipase 20 11 - 51 U/L    MDM Reactive fetal tracing; no contractions CBC, CMP, amylase, lipase -- no concern for gall bladder issues or pancreatitis based on exam & labs GI cocktail -- pt reports resolution in symptoms  Assessment and Plan  A: 1. Heartburn during pregnancy in third trimester   2. History of preterm delivery, currently pregnant in third trimester   3. Encounter for antenatal screening for malformation using ultrasound   4. [redacted] weeks gestation of pregnancy    P: Discharge home Pt has initial OB appt scheduled 12/13 -- will order outpatient anatomy ultrasound  from MAU Rx protonix & promethazine Discussed reasons to return to MAU  Judeth HornErin Winna Golla 11/23/2016, 8:02 PM

## 2016-11-23 NOTE — MAU Note (Signed)
Pt reports she has been having a lot of nausea and indigestion for the last week. Also reports pain in her upper right abd just under her ribs and pain radiates to her right shoulder blade.

## 2016-12-04 ENCOUNTER — Ambulatory Visit (HOSPITAL_COMMUNITY): Payer: No Typology Code available for payment source | Attending: Student

## 2016-12-06 ENCOUNTER — Ambulatory Visit (INDEPENDENT_AMBULATORY_CARE_PROVIDER_SITE_OTHER): Payer: Medicaid Other | Admitting: Medical

## 2016-12-06 ENCOUNTER — Other Ambulatory Visit (HOSPITAL_COMMUNITY)
Admission: RE | Admit: 2016-12-06 | Discharge: 2016-12-06 | Disposition: A | Discharge status: Home / Self Care | Payer: Medicaid Other | Source: Ambulatory Visit | Attending: Medical | Admitting: Medical

## 2016-12-06 ENCOUNTER — Ambulatory Visit (INDEPENDENT_AMBULATORY_CARE_PROVIDER_SITE_OTHER): Payer: Medicaid Other | Admitting: Clinical

## 2016-12-06 ENCOUNTER — Encounter: Payer: Self-pay | Admitting: Medical

## 2016-12-06 VITALS — BP 112/72 | HR 73 | Wt 140.5 lb

## 2016-12-06 DIAGNOSIS — O26893 Other specified pregnancy related conditions, third trimester: Secondary | ICD-10-CM

## 2016-12-06 DIAGNOSIS — O09213 Supervision of pregnancy with history of pre-term labor, third trimester: Secondary | ICD-10-CM | POA: Diagnosis not present

## 2016-12-06 DIAGNOSIS — Z113 Encounter for screening for infections with a predominantly sexual mode of transmission: Secondary | ICD-10-CM | POA: Insufficient documentation

## 2016-12-06 DIAGNOSIS — F3160 Bipolar disorder, current episode mixed, unspecified: Secondary | ICD-10-CM

## 2016-12-06 DIAGNOSIS — M549 Dorsalgia, unspecified: Secondary | ICD-10-CM

## 2016-12-06 DIAGNOSIS — O0973 Supervision of high risk pregnancy due to social problems, third trimester: Secondary | ICD-10-CM | POA: Insufficient documentation

## 2016-12-06 DIAGNOSIS — O09212 Supervision of pregnancy with history of pre-term labor, second trimester: Secondary | ICD-10-CM

## 2016-12-06 DIAGNOSIS — O9989 Other specified diseases and conditions complicating pregnancy, childbirth and the puerperium: Principal | ICD-10-CM

## 2016-12-06 DIAGNOSIS — O99891 Other specified diseases and conditions complicating pregnancy: Secondary | ICD-10-CM

## 2016-12-06 LAB — POCT URINALYSIS DIP (DEVICE)
Bilirubin Urine: NEGATIVE
Glucose, UA: NEGATIVE mg/dL
HGB URINE DIPSTICK: NEGATIVE
Ketones, ur: NEGATIVE mg/dL
Leukocytes, UA: NEGATIVE
NITRITE: NEGATIVE
PH: 7 (ref 5.0–8.0)
PROTEIN: NEGATIVE mg/dL
Specific Gravity, Urine: 1.01 (ref 1.005–1.030)
UROBILINOGEN UA: 0.2 mg/dL (ref 0.0–1.0)

## 2016-12-06 NOTE — BH Specialist Note (Signed)
Session Start time: 4:30End Time: 5:00 Total Time:  30 minutes Type of Service: Behavioral Health - Individual/Family Interpreter: No.   Interpreter Name & Language: n/a # [facility]  SUBJECTIVE: Nechama GuardRikki J Leng is a 25 y.o. female  Pt. was referred by Vonzella NippleJulie Wenzel, PA-C for:  Untreated BH. Pt. reports the following symptoms/concerns: Pt states that   Duration of problem:  At least 10 years Severity: moderate [facility], [facility], did not go back  OBJECTIVE: Mood: Appropriate & Affect: Appropriate Risk of harm to self or others: Low risk of harm to self or others, no SI today, no SI in 10 months; no known risk of harm to others Assessments administered: PHQ9: 14/ GAD7: 10  LIFE CONTEXT:  Family & Social: Undetermined  School/ Work: Undetermined Self-Care: Sleep often an issue, "no sleep for two days, then only sleep for next two days" Life changes: Current pregnancy What is important to pt/family (values): Overall wellbeing  GOALS ADDRESSED:  -Reduce symptoms of anxiety and depression related to bipolar disorder  INTERVENTIONS: Motivational Interviewing and Strength-based   ASSESSMENT:  Pt currently experiencing Bipolar affective disorder.  Pt may benefit from psychoeducation and brief therapeutic intervention regarding coping with symptoms of anxiety and depression related to bipolar disorder.   PLAN: 1. F/U with behavioral health clinician: Two weeks(additional coping strategies added at that time) 2. Behavioral Health meds: none 3. Behavioral recommendations:  -[facility] within next two weeks -Read educational material regarding coping with symptoms of anxiety with panic attack and depression -Try out different apps, as discussed in office visit, to find the one that is most helpful to daily coping -Take home Postpartum Planner to discuss  with family, and make plans for postpartum time 4. Referral: Brief Counseling/Psychotherapy, Publishing rights managerCommunity Resource and Psychoeducation 5. From scale of 1-10, how likely are you to follow plan: 8  Woc-Behavioral Health Clinician  Behavioral Health Clinician  Marlon PelWarmhandoff:   Warm Hand Off Completed.       Depression screen PHQ 2/9 12/06/2016  Decreased Interest 1  Down, Depressed, Hopeless 1  PHQ - 2 Score 2  Altered sleeping 3  Tired, decreased energy 3  Change in appetite 3  Feeling bad or failure about yourself  1  Trouble concentrating 1  Moving slowly or fidgety/restless 1  Suicidal thoughts 0  PHQ-9 Score 14   GAD 7 : Generalized Anxiety Score 12/06/2016  Nervous, Anxious, on Edge 1  Control/stop worrying 1  Worry too much - different things 1  Trouble relaxing 1  Restless 1  Easily annoyed or irritable 3  Afraid - awful might happen 2  Total GAD 7 Score 10

## 2016-12-06 NOTE — Patient Instructions (Signed)
Braxton Hicks Contractions Contractions of the uterus can occur throughout pregnancy. Contractions are not always a sign that you are in labor.  WHAT ARE BRAXTON HICKS CONTRACTIONS?  Contractions that occur before labor are called Braxton Hicks contractions, or false labor. Toward the end of pregnancy (32-34 weeks), these contractions can develop more often and may become more forceful. This is not true labor because these contractions do not result in opening (dilatation) and thinning of the cervix. They are sometimes difficult to tell apart from true labor because these contractions can be forceful and people have different pain tolerances. You should not feel embarrassed if you go to the hospital with false labor. Sometimes, the only way to tell if you are in true labor is for your health care provider to look for changes in the cervix. If there are no prenatal problems or other health problems associated with the pregnancy, it is completely safe to be sent home with false labor and await the onset of true labor. HOW CAN YOU TELL THE DIFFERENCE BETWEEN TRUE AND FALSE LABOR? False Labor   The contractions of false labor are usually shorter and not as hard as those of true labor.   The contractions are usually irregular.   The contractions are often felt in the front of the lower abdomen and in the groin.   The contractions may go away when you walk around or change positions while lying down.   The contractions get weaker and are shorter lasting as time goes on.   The contractions do not usually become progressively stronger, regular, and closer together as with true labor.  True Labor   Contractions in true labor last 30-70 seconds, become very regular, usually become more intense, and increase in frequency.   The contractions do not go away with walking.   The discomfort is usually felt in the top of the uterus and spreads to the lower abdomen and low back.   True labor can be  determined by your health care provider with an exam. This will show that the cervix is dilating and getting thinner.  WHAT TO REMEMBER  Keep up with your usual exercises and follow other instructions given by your health care provider.   Take medicines as directed by your health care provider.   Keep your regular prenatal appointments.   Eat and drink lightly if you think you are going into labor.   If Braxton Hicks contractions are making you uncomfortable:   Change your position from lying down or resting to walking, or from walking to resting.   Sit and rest in a tub of warm water.   Drink 2-3 glasses of water. Dehydration may cause these contractions.   Do slow and deep breathing several times an hour.  WHEN SHOULD I SEEK IMMEDIATE MEDICAL CARE? Seek immediate medical care if:  Your contractions become stronger, more regular, and closer together.   You have fluid leaking or gushing from your vagina.   You have a fever.   You pass blood-tinged mucus.   You have vaginal bleeding.   You have continuous abdominal pain.   You have low back pain that you never had before.   You feel your baby's head pushing down and causing pelvic pressure.   Your baby is not moving as much as it used to.  This information is not intended to replace advice given to you by your health care provider. Make sure you discuss any questions you have with your health care   provider. Document Released: 12/11/2005 Document Revised: 04/03/2016 Document Reviewed: 09/22/2013 Elsevier Interactive Patient Education  2017 Elsevier Inc. Introduction Patient Name: ________________________________________________ Patient Due Date: ____________________ What is a fetal movement count? A fetal movement count is the number of times that you feel your baby move during a certain amount of time. This may also be called a fetal kick count. A fetal movement count is recommended for every pregnant  woman. You may be asked to start counting fetal movements as early as week 28 of your pregnancy. Pay attention to when your baby is most active. You may notice your baby's sleep and wake cycles. You may also notice things that make your baby move more. You should do a fetal movement count:  When your baby is normally most active.  At the same time each day. A good time to count movements is while you are resting, after having something to eat and drink. How do I count fetal movements? 1. Find a quiet, comfortable area. Sit, or lie down on your side. 2. Write down the date, the start time and stop time, and the number of movements that you felt between those two times. Take this information with you to your health care visits. 3. For 2 hours, count kicks, flutters, swishes, rolls, and jabs. You should feel at least 10 movements during 2 hours. 4. You may stop counting after you have felt 10 movements. 5. If you do not feel 10 movements in 2 hours, have something to eat and drink. Then, keep resting and counting for 1 hour. If you feel at least 4 movements during that hour, you may stop counting. Contact a health care provider if:  You feel fewer than 4 movements in 2 hours.  Your baby is not moving like he or she usually does. Date: ____________ Start time: ____________ Stop time: ____________ Movements: ____________ Date: ____________ Start time: ____________ Stop time: ____________ Movements: ____________ Date: ____________ Start time: ____________ Stop time: ____________ Movements: ____________ Date: ____________ Start time: ____________ Stop time: ____________ Movements: ____________ Date: ____________ Start time: ____________ Stop time: ____________ Movements: ____________ Date: ____________ Start time: ____________ Stop time: ____________ Movements: ____________ Date: ____________ Start time: ____________ Stop time: ____________ Movements: ____________ Date: ____________ Start time:  ____________ Stop time: ____________ Movements: ____________ Date: ____________ Start time: ____________ Stop time: ____________ Movements: ____________ This information is not intended to replace advice given to you by your health care provider. Make sure you discuss any questions you have with your health care provider. Document Released: 01/10/2007 Document Revised: 08/09/2016 Document Reviewed: 01/20/2016 Elsevier Interactive Patient Education  2017 Elsevier Inc.  

## 2016-12-06 NOTE — Progress Notes (Signed)
   PRENATAL VISIT NOTE  Subjective:  Stacy Peterson is a 25 y.o. [phone] at 6572w5d being seen today for initial prenatal care visit.  She is currently monitored for the following issues for this low-risk pregnancy and has Left-sided low back pain with sciatica; HSV-2 seropositive; Schizoaffective disorder, bipolar type (HCC); Drug overdose, intentional (HCC); Intentional overdose of drug in tablet form (HCC); Recurrent major depression (HCC); Depression, major, recurrent, severe with psychosis (HCC); PTSD (post-traumatic stress disorder); Polysubstance dependence (HCC); and Encounter for supervision of pregnancy with history of pre-term labor in second trimester, antepartum on her problem list.  Patient reports backache.  Contractions: Irritability. Vag. Bleeding: None.  Movement: Present. Denies leaking of fluid.   The following portions of the patient's history were reviewed and updated as appropriate: allergies, current medications, past family history, past medical history, past social history, past surgical history and problem list. Problem list updated.  Objective:   Vitals:   12/06/16 1458  BP: 112/72  Pulse: 73  Weight: 140 lb 8 oz (63.7 kg)    Fetal Status: Fetal Heart Rate (bpm): 145 Fundal Height: 31 cm Movement: Present     General:  Alert, oriented and cooperative. Patient is in no acute distress.  Skin: Skin is warm and dry. No rash noted.   Cardiovascular: Normal heart rate noted  Respiratory: Normal respiratory effort, no problems with respiration noted  Abdomen: Soft, gravid, appropriate for gestational age. Pain/Pressure: Present     Pelvic:  Cervical exam deferred        Extremities: Normal range of motion.  Edema: None  Mental Status: Normal mood and affect. Normal behavior. Normal judgment and thought content.  MSK: no swelling of perispinal region. Mild tenderness to touch along the scapular board  Assessment and Plan:  Pregnancy: [id number] at 4072w5d  1. Encounter  for supervision of pregnancy with history of pre-term labor in second trimester, antepartum - Prenatal Profile - Hemoglobinopathy Evaluation - Pain Mgmt, Profile 6 Conf w/o mM, U - GC/Chlamydia probe amp ([place])not at [facility] - Cystic fibrosis diagnostic study - Patient counseled by Hulda MarinJamie McMannes due to mental health history  2. Back pain affecting pregnancy in third trimester - Ambulatory referral to Physical Therapy - Advised of appropriate topical therapy use during pregnancy, encouraged continued heat and ice  Preterm labor symptoms and general obstetric precautions including but not limited to vaginal bleeding, contractions, leaking of fluid and fetal movement were reviewed in detail with the patient. Please refer to After Visit Summary for other counseling recommendations.  Return in about 2 weeks (around 12/20/2016) for LOB.   Marny LowensteinJulie N Renesmay Nesbitt, PA-C

## 2016-12-07 LAB — PRENATAL PROFILE (SOLSTAS)
ANTIBODY SCREEN: NEGATIVE
BASOS ABS: 0 {cells}/uL (ref 0–200)
Basophils Relative: 0 %
EOS ABS: 0 {cells}/uL — AB (ref 15–500)
EOS PCT: 0 %
HCT: 34.5 % — ABNORMAL LOW (ref 35.0–45.0)
HIV 1&2 Ab, 4th Generation: NONREACTIVE
Hemoglobin: 11 g/dL — ABNORMAL LOW (ref 11.7–15.5)
Hepatitis B Surface Ag: NEGATIVE
LYMPHS PCT: 15 %
Lymphs Abs: 1320 cells/uL (ref 850–3900)
MCH: 27.8 pg (ref 27.0–33.0)
MCHC: 31.9 g/dL — ABNORMAL LOW (ref 32.0–36.0)
MCV: 87.1 fL (ref 80.0–100.0)
MPV: 9.8 fL (ref 7.5–12.5)
Monocytes Absolute: 440 cells/uL (ref 200–950)
Monocytes Relative: 5 %
NEUTROS PCT: 80 %
Neutro Abs: 7040 cells/uL (ref 1500–7800)
PLATELETS: 185 10*3/uL (ref 140–400)
RBC: 3.96 MIL/uL (ref 3.80–5.10)
RDW: 15.9 % — ABNORMAL HIGH (ref 11.0–15.0)
RH TYPE: POSITIVE
RUBELLA: 1.17 {index} — AB (ref <0.90)
WBC: 8.8 10*3/uL (ref 3.8–10.8)

## 2016-12-07 LAB — GC/CHLAMYDIA PROBE AMP (~~LOC~~) NOT AT ARMC
Chlamydia: NEGATIVE
Neisseria Gonorrhea: NEGATIVE

## 2016-12-08 LAB — HEMOGLOBINOPATHY EVALUATION
HCT: 34.5 % — ABNORMAL LOW (ref 35.0–45.0)
HEMOGLOBIN: 11 g/dL — AB (ref 11.7–15.5)
HGB A2 QUANT: 2.3 % (ref 1.8–3.5)
HGB A: 96.7 % (ref >96.0)
Hgb F Quant: <1 % (ref <2.0)
MCH: 27.8 pg (ref 27.0–33.0)
MCV: 87.1 fL (ref 80.0–100.0)
RBC: 3.96 MIL/uL (ref 3.80–5.10)
RDW: 15.9 % — AB (ref 11.0–15.0)

## 2016-12-09 LAB — PAIN MGMT, PROFILE 6 CONF W/O MM, U
6 Acetylmorphine: NEGATIVE ng/mL (ref <10)
ALCOHOL METABOLITES: NEGATIVE ng/mL (ref <500)
Amphetamines: NEGATIVE ng/mL (ref <500)
BARBITURATES: NEGATIVE ng/mL (ref <300)
BENZODIAZEPINES: NEGATIVE ng/mL (ref <100)
COCAINE METABOLITE: NEGATIVE ng/mL (ref <150)
CREATININE: 59.6 mg/dL (ref >=20.0)
MARIJUANA METABOLITE: 148 ng/mL — AB (ref <5)
MARIJUANA METABOLITE: POSITIVE ng/mL — AB (ref <20)
METHADONE METABOLITE: NEGATIVE ng/mL (ref <100)
OPIATES: NEGATIVE ng/mL (ref <100)
OXYCODONE: NEGATIVE ng/mL (ref <100)
Oxidant: NEGATIVE ug/mL (ref <200)
PH: 7.2 (ref 4.5–9.0)
PHENCYCLIDINE: NEGATIVE ng/mL (ref <25)
Please note:: 0

## 2016-12-11 ENCOUNTER — Encounter: Payer: Self-pay | Admitting: Medical

## 2016-12-11 DIAGNOSIS — F1290 Cannabis use, unspecified, uncomplicated: Secondary | ICD-10-CM | POA: Insufficient documentation

## 2016-12-11 LAB — CYSTIC FIBROSIS DIAGNOSTIC STUDY: Result Summary:: DETECTED — AB

## 2016-12-12 ENCOUNTER — Other Ambulatory Visit (HOSPITAL_COMMUNITY): Payer: Self-pay | Admitting: Student

## 2016-12-12 ENCOUNTER — Ambulatory Visit (HOSPITAL_COMMUNITY)
Admission: RE | Admit: 2016-12-12 | Discharge: 2016-12-12 | Disposition: A | Discharge status: Home / Self Care | Payer: Medicaid Other | Source: Ambulatory Visit | Attending: Student | Admitting: Student

## 2016-12-12 DIAGNOSIS — Z3689 Encounter for other specified antenatal screening: Secondary | ICD-10-CM | POA: Diagnosis present

## 2016-12-12 DIAGNOSIS — Z3A33 33 weeks gestation of pregnancy: Secondary | ICD-10-CM | POA: Diagnosis not present

## 2016-12-12 DIAGNOSIS — O09213 Supervision of pregnancy with history of pre-term labor, third trimester: Secondary | ICD-10-CM

## 2016-12-12 DIAGNOSIS — O09893 Supervision of other high risk pregnancies, third trimester: Secondary | ICD-10-CM

## 2016-12-12 DIAGNOSIS — Z3A30 30 weeks gestation of pregnancy: Secondary | ICD-10-CM

## 2016-12-12 DIAGNOSIS — Z363 Encounter for antenatal screening for malformations: Secondary | ICD-10-CM

## 2016-12-15 ENCOUNTER — Encounter: Payer: Self-pay | Admitting: *Deleted

## 2016-12-21 ENCOUNTER — Ambulatory Visit (INDEPENDENT_AMBULATORY_CARE_PROVIDER_SITE_OTHER): Payer: Medicaid Other | Admitting: Obstetrics and Gynecology

## 2016-12-21 VITALS — BP 116/75 | HR 89 | Wt 138.0 lb

## 2016-12-21 DIAGNOSIS — G8929 Other chronic pain: Secondary | ICD-10-CM | POA: Diagnosis not present

## 2016-12-21 DIAGNOSIS — N898 Other specified noninflammatory disorders of vagina: Secondary | ICD-10-CM | POA: Diagnosis not present

## 2016-12-21 DIAGNOSIS — Z3403 Encounter for supervision of normal first pregnancy, third trimester: Secondary | ICD-10-CM

## 2016-12-21 DIAGNOSIS — M5440 Lumbago with sciatica, unspecified side: Secondary | ICD-10-CM

## 2016-12-21 DIAGNOSIS — Z141 Cystic fibrosis carrier: Secondary | ICD-10-CM | POA: Diagnosis not present

## 2016-12-21 DIAGNOSIS — O0973 Supervision of high risk pregnancy due to social problems, third trimester: Secondary | ICD-10-CM

## 2016-12-21 DIAGNOSIS — O26893 Other specified pregnancy related conditions, third trimester: Secondary | ICD-10-CM | POA: Diagnosis not present

## 2016-12-21 DIAGNOSIS — O0933 Supervision of pregnancy with insufficient antenatal care, third trimester: Secondary | ICD-10-CM

## 2016-12-21 DIAGNOSIS — F1290 Cannabis use, unspecified, uncomplicated: Secondary | ICD-10-CM

## 2016-12-21 DIAGNOSIS — R768 Other specified abnormal immunological findings in serum: Secondary | ICD-10-CM

## 2016-12-21 MED ORDER — CYCLOBENZAPRINE HCL 10 MG PO TABS: 10.0000 mg | ORAL_TABLET | Freq: Three times a day (TID) | ORAL | 1 refills | Status: DC | PRN

## 2016-12-21 NOTE — Progress Notes (Signed)
Prenatal Visit Note Date: 12/21/2016 Clinic: [facility]  Subjective:  Stacy Peterson is a 25 y.o. [phone] at 4936w6d being seen today for ongoing prenatal care.  She is currently monitored for the following issues for this high-risk pregnancy and has Left-sided low back pain with sciatica; Late prenatal care affecting pregnancy in third trimester; HSV-2 seropositive; Schizoaffective disorder, bipolar type (HCC); Drug overdose, intentional (HCC); Intentional overdose of drug in tablet form (HCC); Recurrent major depression (HCC); Depression, major, recurrent, severe with psychosis (HCC); PTSD (post-traumatic stress disorder); Polysubstance dependence (HCC); Supervision of high risk pregnancy due to social problems in third trimester; Marijuana use; and Cystic fibrosis carrier on her problem list.  Patient reports continued sciatic and back pain.  Contractions: Irritability. Vag. Bleeding: None.  Movement: Present. Denies leaking of fluid.   The following portions of the patient's history were reviewed and updated as appropriate: allergies, current medications, past family history, past medical history, past social history, past surgical history and problem list. Problem list updated.  Objective:   Vitals:   12/21/16 1402  BP: 116/75  Pulse: 89  Weight: 138 lb (62.6 kg)    Fetal Status: Fetal Heart Rate (bpm): 140 Fundal Height: 31 cm Movement: Present  Presentation: Vertex  General:  Alert, oriented and cooperative. Patient is in no acute distress.  Skin: Skin is warm and dry. No rash noted.   Cardiovascular: Normal heart rate noted  Respiratory: Normal respiratory effort, no problems with respiration noted  Abdomen: Soft, gravid, appropriate for gestational age. Pain/Pressure: Present     Pelvic:  Cervical exam deferred        Extremities: Normal range of motion.  Edema: None  Mental Status: Normal mood and affect. Normal behavior. Normal judgment and thought  content.   Urinalysis:      Assessment and Plan:  Pregnancy: [id number] at 8536w6d  1. Supervision of low-risk first pregnancy, third trimester Routine care. BTL papers signed previously. Cant stay for 1hr GCT today. Will bring back next week for 2hr GTT  2. Vaginal discharge during pregnancy in third trimester Self swab done today - Wet prep, genital  3. Cystic fibrosis carrier Pt aware. FOB is Af. Am. But still recommended that she ask him to get tested too.   4. Late prenatal care affecting pregnancy in third trimester See below  5. Marijuana use On NOB UDS  6. HSV-2 seropositive ppx at 35-36wks  7. Chronic left-sided low back pain with sciatica, sciatica laterality unspecified Flexeril. Patient awaiting to hear back re: PT consult but I told her that could take weeks to set up. Comfort measures d/w pt.   8. Psych Just got housing in a house today and seems stable. [facility] she didn't have a good experience there. Mood seems good. Pt unable to stay to see Asher MuirJamie today so will have her see her at her 2hr GTT lab only visit.   Preterm labor symptoms and general obstetric precautions including but not limited to vaginal bleeding, contractions, leaking of fluid and fetal movement were reviewed in detail with the patient. Please refer to After Visit Summary for other counseling recommendations.  Return in about 5 days (around 12/26/2016) for 12/26/2016 2hr GTT and appt with Asher MuirJamie. 10-14d rob.   Tyronza Bingharlie Reyanne Hussar, MD

## 2016-12-22 LAB — WET PREP, GENITAL
Clue Cells Wet Prep HPF POC: NONE SEEN
TRICH WET PREP: NONE SEEN

## 2016-12-25 NOTE — L&D Delivery Note (Signed)
Delivery Note Called to room as pt had SROM followed by a strong urge to push and was noted to be complete. She pushed well with one contraction and at 6:36 AM a viable female was delivered via Vaginal, Spontaneous Delivery (Presentation: OA ).  APGAR: 8, 9; weight: pending. Placenta status: spont, intact.  Cord: 3 vessel  Anesthesia:  Epidural Episiotomy: None Lacerations: None Est. Blood Loss (mL): 100  Mom to postpartum.  Baby to Couplet care / Skin to Skin.  Pt will be NPO in anticipation of a BTL later this morning.  Cam HaiSHAW, Bryce Kimble CNM 02/03/2017, 7:02 AM

## 2016-12-27 ENCOUNTER — Ambulatory Visit: Payer: Self-pay

## 2016-12-27 ENCOUNTER — Other Ambulatory Visit: Payer: Self-pay

## 2016-12-27 NOTE — BH Specialist Note (Deleted)
Session Start time: ***   End Time: *** Total Time:  *** Type of Service: Behavioral Health - Individual/Family Interpreter: No.   Interpreter Name & Language: n/a # [facility]  SUBJECTIVE: Stacy Peterson is a 26 y.o. female  Pt. was referred by f/u for:  f/u untreated BH. Pt. reports the following symptoms/concerns: *** Duration of problem:  Over 10 years Severity: moderate [facility], [facility], ***  OBJECTIVE: Mood: Appropriate & Affect: Appropriate Risk of harm to self or others: *** Assessments administered: PHQ9: ***/ GAD7: ***  LIFE CONTEXT:  Family & Social: ***  School/ Work: ***  Self-Care: ***  Life changes: Current pregnancy What is important to pt/family (values): Overall wellbeing, ***  GOALS ADDRESSED:  -Reduce symptoms of anxiety and depression related to bipolar disorder  INTERVENTIONS: {CHL AMB BH Type of Intervention:21022753}   ASSESSMENT:  Pt currently experiencing ***.  Pt may benefit from ***.   PLAN: 1. F/U with behavioral health clinician: *** 2. Behavioral Health meds: none 3. Behavioral recommendations:  -*** [facility]? -***Did pt read educ material: anxiety/panic/depression?  -***Did pt try any apps -***Did pt discuss PP planner with family, any plans? 4. Referral: Brief Counseling/Psychotherapy 5. From scale of 1-10, how likely are you to follow plan: ***  Woc-Behavioral Health Clinician  Behavioral Health Clinician  Marlon PelWarmhandoff: no  Depression screen [facility] 2/9 12/21/2016 12/06/2016  Decreased Interest 1 1  Down, Depressed, Hopeless 1 1  PHQ - 2 Score 2 2  Altered sleeping 3 3  Tired, decreased energy 2 3  Change in appetite 2 3  Feeling bad or failure about yourself  1 1  Trouble concentrating 1 1  Moving slowly or fidgety/restless 1 1  Suicidal thoughts 0 0  PHQ-9 Score 12 14   GAD 7 : Generalized Anxiety Score  12/21/2016 12/06/2016  Nervous, Anxious, on Edge 1 1  Control/stop worrying 1 1  Worry too much - different things 1 1  Trouble relaxing 1 1  Restless 2 1  Easily annoyed or irritable 2 3  Afraid - awful might happen 1 2  Total GAD 7 Score 9 10

## 2016-12-28 ENCOUNTER — Other Ambulatory Visit: Payer: Self-pay

## 2016-12-28 ENCOUNTER — Ambulatory Visit: Payer: Self-pay

## 2016-12-28 NOTE — BH Specialist Note (Deleted)
Session Start time: ***   End Time: *** Total Time:  *** Type of Service: Behavioral Health - Individual/Family Interpreter: No.   Interpreter Name & Language: n/a # [facility]  SUBJECTIVE: Stacy GuardRikki J Peterson is a 26 y.o. female  Pt. was referred by f/u for:  Untreated BH . Pt. reports the following symptoms/concerns: *** Duration of problem:  Over 10 years Severity: moderate [facility], [facility], did not return  OBJECTIVE: Mood: {BHH MOOD:22306} & Affect: {BHH AFFECT:22307} Risk of harm to self or others: *** Assessments administered: ***  LIFE CONTEXT:  Family & Social: ***  School/ Work: ***  Self-Care: ***  Life changes: *** What is important to pt/family (values): ***  GOALS ADDRESSED:  ***  INTERVENTIONS: {CHL AMB BH Type of Intervention:21022753}   ASSESSMENT:  Pt currently experiencing ***.  Pt may benefit from ***.   PLAN: 1. F/U with behavioral health clinician: *** 2. Behavioral Health meds: none 3. Behavioral recommendations:  -[facility] *** -Did she read educational material(dep/anx/panic)*** -Did she try apps, any that helped?*** -Postpartum planner?*** 4. Referral: {BH Clinician Interventions:[phone]} 5. From scale of 1-10, how likely are you to follow plan: ***  Woc-Behavioral Health Clinician  Behavioral Health Clinician  Marlon PelWarmhandoff: no  Dep[facility] screen [facility] 12/21/2016 12/06/2016  Decreased Interest 1 1  Down, Depressed, Hopeless 1 1  PHQ - 2 Score 2 2  Altered sleeping 3 3  Tired, decreased energy 2 3  Change in appetite 2 3  Feeling bad or failure about yourself  1 1  Trouble concentrating 1 1  Moving slowly or fidgety/restless 1 1  Suicidal thoughts 0 0  PHQ-9 Score 12 14   GAD 7 : Generalized Anxiety Score 12/21/2016 12/06/2016  Nervous, Anxious, on Edge 1 1  Control/stop worrying 1 1  Worry too much - different things 1 1   Trouble relaxing 1 1  Restless 2 1  Easily annoyed or irritable 2 3  Afraid - awful might happen 1 2  Total GAD 7 Score 9 10

## 2017-01-04 ENCOUNTER — Encounter: Payer: Self-pay | Admitting: *Deleted

## 2017-01-04 ENCOUNTER — Other Ambulatory Visit: Payer: Self-pay | Admitting: *Deleted

## 2017-01-04 DIAGNOSIS — B379 Candidiasis, unspecified: Secondary | ICD-10-CM

## 2017-01-04 MED ORDER — TERCONAZOLE 0.4 % VA CREA: 1.0000 | TOPICAL_CREAM | Freq: Every day | VAGINAL | 0 refills | Status: AC

## 2017-01-04 NOTE — Progress Notes (Addendum)
Per 12/28 wet prep patient has a yeast infection. Patient has appointment 1/12 for 2hr gtt. Please inform her of result, visit note updated. Terconozole cream to pharmacy.

## 2017-01-05 ENCOUNTER — Other Ambulatory Visit: Payer: Self-pay

## 2017-01-08 ENCOUNTER — Encounter: Payer: Self-pay | Admitting: Family Medicine

## 2017-01-09 ENCOUNTER — Encounter: Payer: Self-pay | Admitting: Advanced Practice Midwife

## 2017-01-23 ENCOUNTER — Other Ambulatory Visit (HOSPITAL_COMMUNITY)
Admission: RE | Admit: 2017-01-23 | Discharge: 2017-01-23 | Disposition: A | Discharge status: Home / Self Care | Payer: Medicaid Other | Source: Ambulatory Visit | Attending: Certified Nurse Midwife | Admitting: Certified Nurse Midwife

## 2017-01-23 ENCOUNTER — Ambulatory Visit (INDEPENDENT_AMBULATORY_CARE_PROVIDER_SITE_OTHER): Payer: Medicaid Other | Admitting: Certified Nurse Midwife

## 2017-01-23 VITALS — BP 125/67 | HR 63 | Wt 144.3 lb

## 2017-01-23 DIAGNOSIS — Z113 Encounter for screening for infections with a predominantly sexual mode of transmission: Secondary | ICD-10-CM | POA: Diagnosis not present

## 2017-01-23 DIAGNOSIS — O0930 Supervision of pregnancy with insufficient antenatal care, unspecified trimester: Secondary | ICD-10-CM

## 2017-01-23 DIAGNOSIS — O0993 Supervision of high risk pregnancy, unspecified, third trimester: Secondary | ICD-10-CM

## 2017-01-23 DIAGNOSIS — O0933 Supervision of pregnancy with insufficient antenatal care, third trimester: Secondary | ICD-10-CM

## 2017-01-23 NOTE — Addendum Note (Signed)
Addended by: Kathee DeltonHILLMAN, Kolleen Ochsner L on: 01/23/2017 11:58 AM   Modules accepted: Orders

## 2017-01-23 NOTE — Progress Notes (Addendum)
Subjective:  Nechama GuardRikki J Mackert is a 26 y.o. [phone] at 1628w4d being seen today for ongoing prenatal care.  She is currently monitored for the following issues for this high-risk pregnancy and has Left-sided low back pain with sciatica; Late prenatal care affecting pregnancy in third trimester; HSV-2 seropositive; Schizoaffective disorder, bipolar type (HCC); Drug overdose, intentional (HCC); Intentional overdose of drug in tablet form (HCC); Recurrent major depression (HCC); Depression, major, recurrent, severe with psychosis (HCC); PTSD (post-traumatic stress disorder); Polysubstance dependence (HCC); Supervision of high risk pregnancy due to social problems in third trimester; Marijuana use; Cystic fibrosis carrier; and Limited prenatal care, antepartum on her problem list.  Patient reports no complaints.  Contractions: Irritability. Vag. Bleeding: None.  Movement: Present. Denies leaking of fluid.   The following portions of the patient's history were reviewed and updated as appropriate: allergies, current medications, past family history, past medical history, past social history, past surgical history and problem list. Problem list updated.  Objective:   Vitals:   01/23/17 1107  BP: 125/67  Pulse: 63  Weight: 144 lb 4.8 oz (65.5 kg)    Fetal Status: Fetal Heart Rate (bpm): 132 Fundal Height: 37 cm Movement: Present  Presentation: Vertex  General:  Alert, oriented and cooperative. Patient is in no acute distress.  Skin: Skin is warm and dry. No rash noted.   Cardiovascular: Normal heart rate noted  Respiratory: Normal respiratory effort, no problems with respiration noted  Abdomen: Soft, gravid, appropriate for gestational age. Pain/Pressure: Present     Pelvic: Vag. Bleeding: None     Cervical exam deferred        Extremities: Normal range of motion.  Edema: None  Mental Status: Normal mood and affect. Normal behavior. Normal judgment and thought content.   Urinalysis:      Assessment  and Plan:  Pregnancy: [id number] at 3728w4d  1. Supervision of high risk pregnancy in third trimester - refused GTT but agreed to fasting glucose, pt left before collection - refused GBS culture - initially agreed to weekly visits until delivery  2. Limited prenatal care, antepartum  After collection of GC/CT swab patient would not allow collection of GBS swab. I offered her to self collect but she refused. She became instantly angry and states "you just scraped up my pussy and that's why I don't come here. I will not be coming back, that's why I don't come here cause ya'll poke and prod me".  Term labor symptoms and general obstetric precautions including but not limited to vaginal bleeding, contractions, leaking of fluid and fetal movement were reviewed in detail with the patient. Please refer to After Visit Summary for other counseling recommendations.  Return in about 1 week (around 01/30/2017).  Marylynn Pearsonarrie Hillman, RN present during swab collection  Donette LarryMelanie Ranell Finelli, CNM

## 2017-01-23 NOTE — Addendum Note (Signed)
Addended by: Kathee DeltonHILLMAN, Alexander Mcauley L on: 01/23/2017 04:54 PM   Modules accepted: Orders

## 2017-01-25 LAB — GC/CHLAMYDIA PROBE AMP (~~LOC~~) NOT AT ARMC
Chlamydia: NEGATIVE
Neisseria Gonorrhea: NEGATIVE

## 2017-01-25 IMAGING — US US MFM OB DETAIL+14 WK
1 series · 14 of 28 positions shown · non-contrast
Comparison: none

[Series 1: us mfm ob detail+14 wk · 90 acquisitions, 14 frames shown]
[im 4/90]
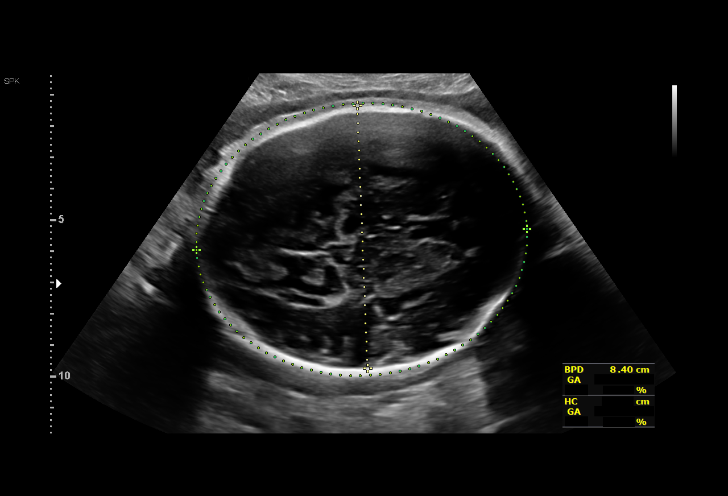
[im 10/90]
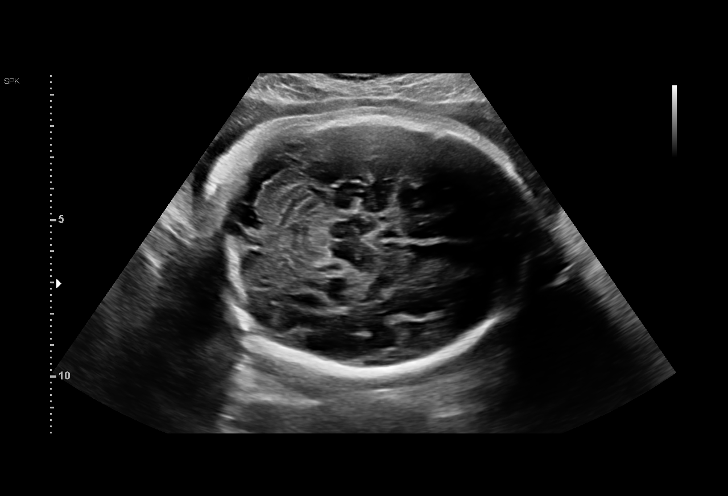
[im 17/90]
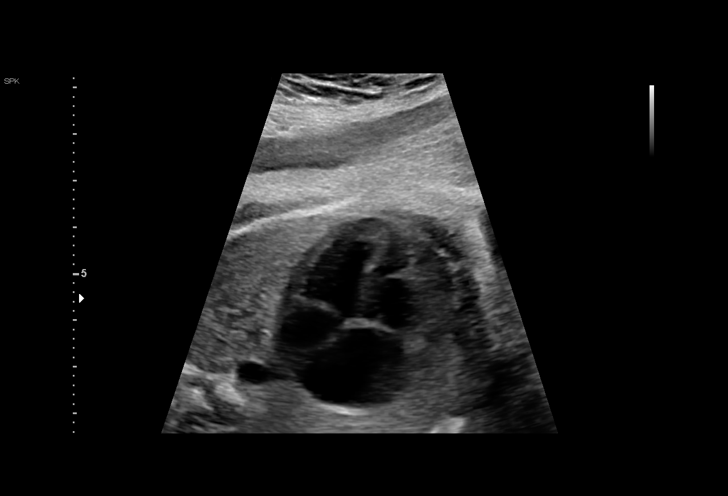
[im 24/90]
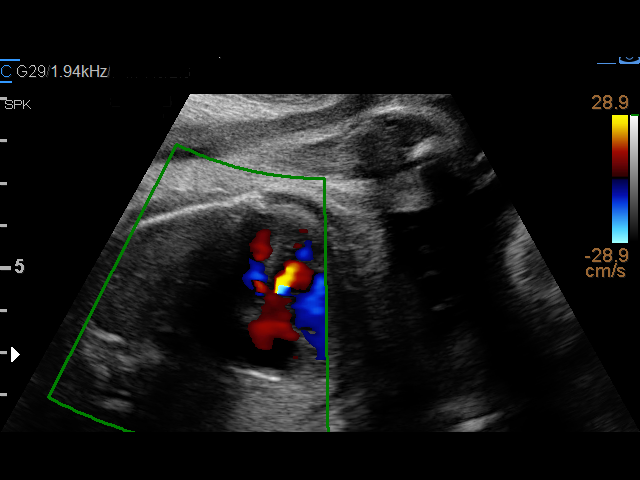
[im 30/90]
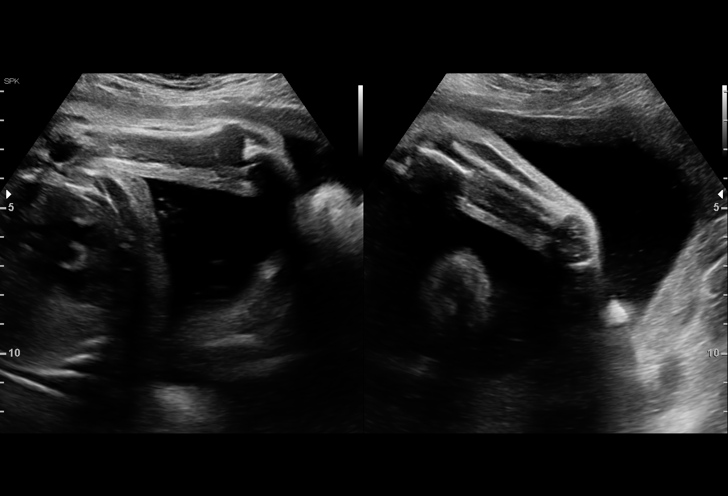
[im 37/90]
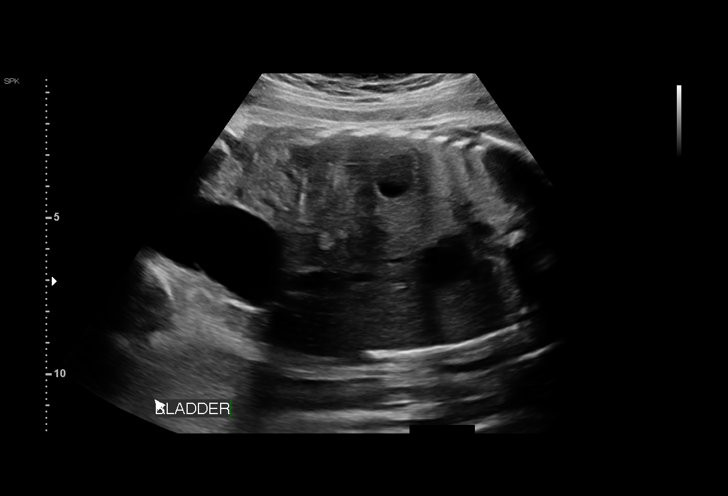
[im 43/90]
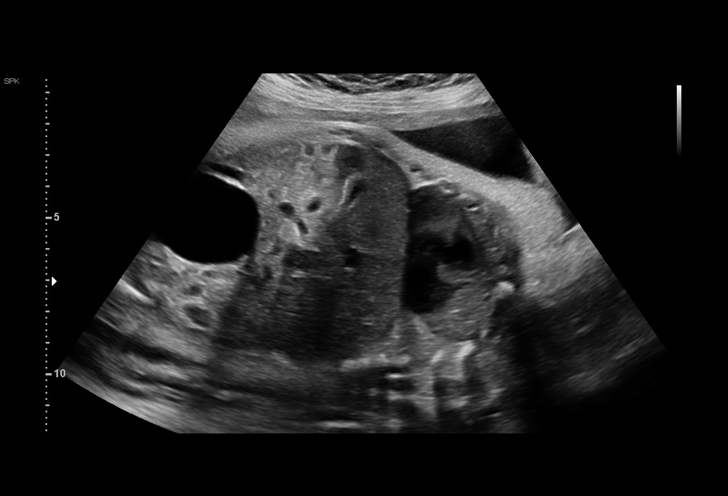
[im 50/90]
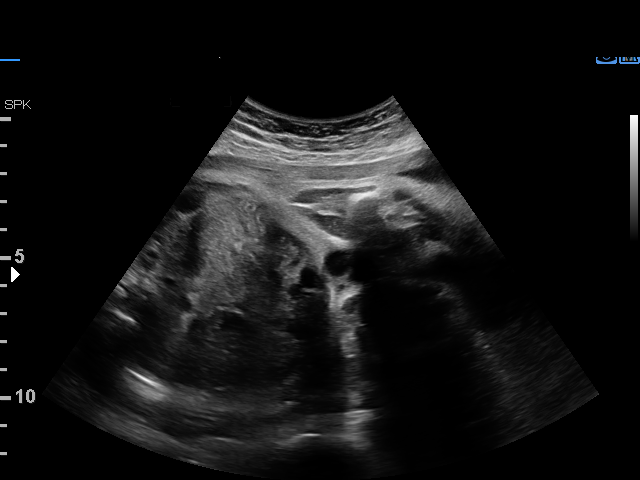
[im 57/90]
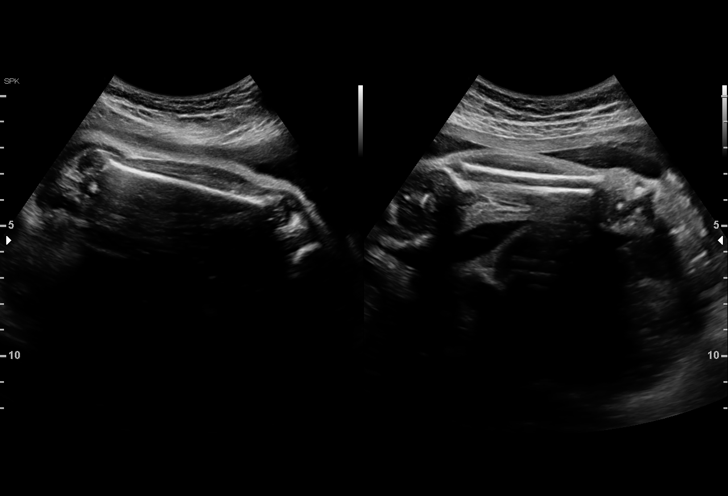
[im 63/90]
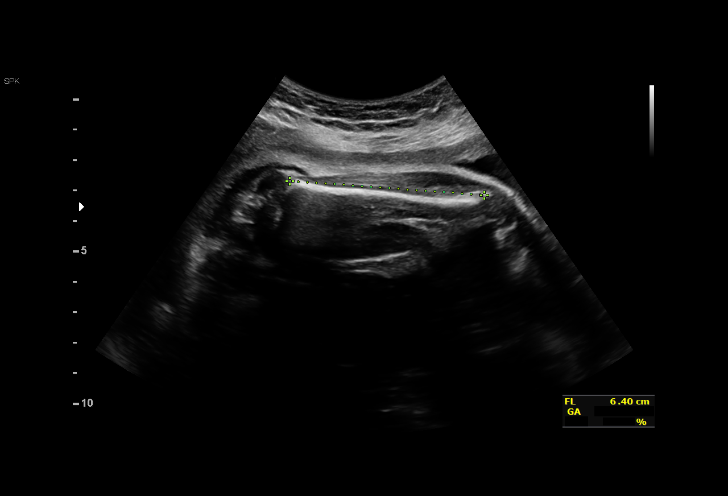
[im 70/90]
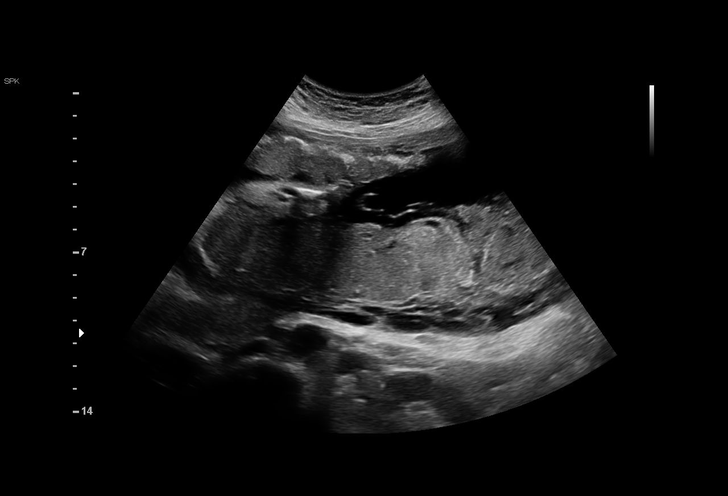
[im 76/90]
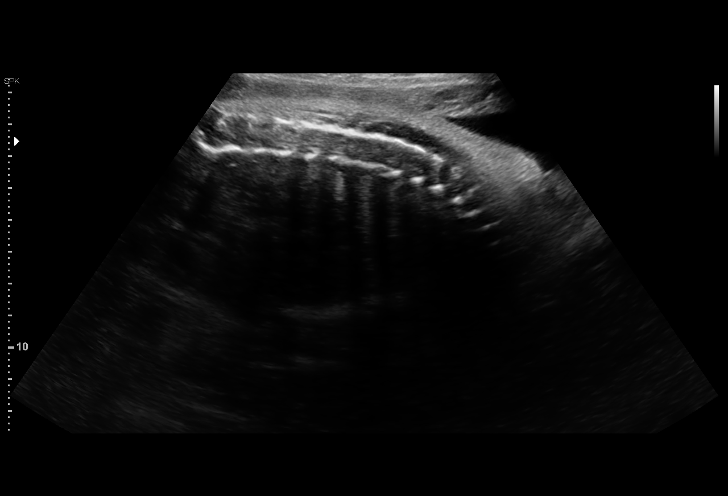
[im 83/90]
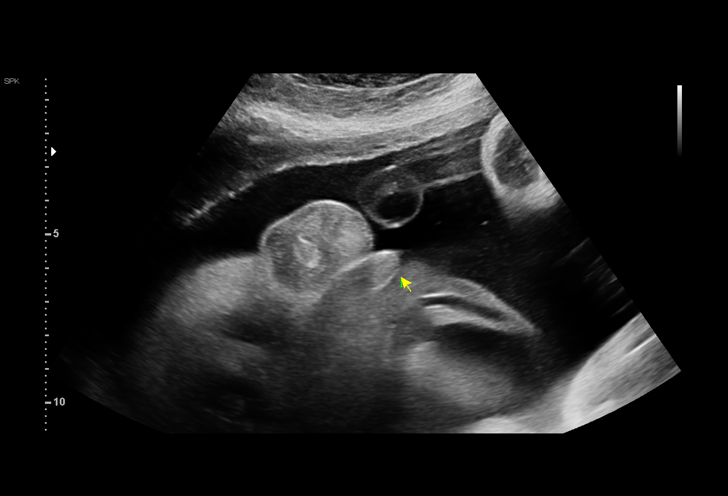
[im 90/90]
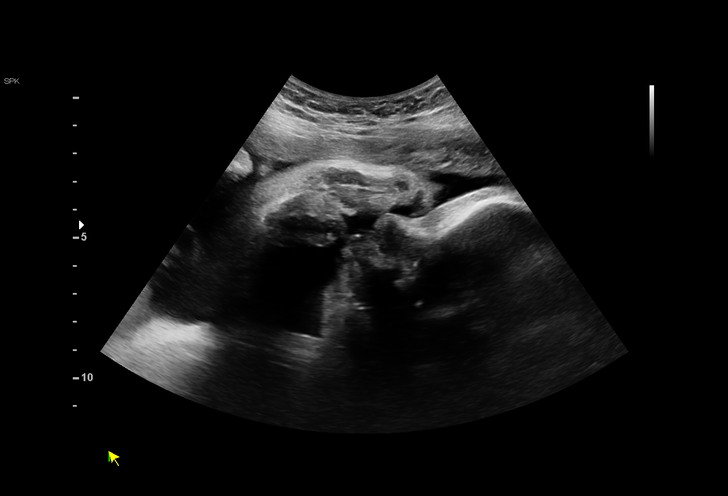

[14 of 28 positions shown; findings below may reference images not displayed]

Indications

33 weeks gestation of pregnancy
Preterm labor without delivery, second
trimester IUFD previous 21 weeks
Encounter for antenatal screening,
unspecified 001
OB History

Blood Type:            Height:  5'5"   Weight (lb):  140      BMI:
Gravidity:    5         Term:   3        Prem:   1        SAB:   0
TOP:          0       Ectopic:  0        Living: 3
Fetal Evaluation

Num Of Fetuses:     1
Fetal Heart         135
Rate(bpm):
Cardiac Activity:   Observed
Presentation:       Cephalic
Placenta:           Posterior, above cervical os
P. Cord Insertion:  Visualized, central

Amniotic Fluid
AFI FV:      Subjectively within normal limits
Biometry

BPD:      83.9  mm     G. Age:  33w 5d         52  %    CI:        76.91   %   70 - 86
FL/HC:      21.1   %   19.4 -
HC:       303   mm     G. Age:  33w 5d         18  %    HC/AC:      1.07       0.96 -
AC:      282.5  mm     G. Age:  32w 2d         19  %    FL/BPD:     76.0   %   71 - 87
FL:       63.8  mm     G. Age:  33w 0d         24  %    FL/AC:      22.6   %   20 - 24
HUM:      56.2  mm     G. Age:  32w 5d         39  %
CER:      42.7  mm     G. Age:  36w 5d         85  %
Est. FW:    3699  gm      4 lb 8 oz     43  %
Gestational Age

U/S Today:     33w 1d                                        EDD:   01/29/17
Best:          33w 4d    Det. By:   Previous Ultrasound      EDD:   01/26/17
(07/06/16)
Anatomy

Cranium:               Appears normal         Aortic Arch:            Not well visualized
Cavum:                 Appears normal         Ductal Arch:            Appears normal
Ventricles:            Appears normal         Diaphragm:              Appears normal
Choroid Plexus:        Not well visualized    Stomach:                Appears normal, left
sided
Cerebellum:            Appears normal         Abdomen:                Appears normal
Posterior Fossa:       Appears normal         Abdominal Wall:         Appears nml (cord
insert, abd wall)
Nuchal Fold:           Not applicable (>20    Cord Vessels:           Appears normal (3
wks GA)                                        vessel cord)
Face:                  Profile appears        Kidneys:                Appear normal
normal
Lips:                  Appears normal         Bladder:                Appears normal
Thoracic:              Appears normal         Spine:                  Ltd views no
intracranial signs of
NT
Heart:                 Appears normal         Upper Extremities:      Appears normal
(4CH, axis, and situs
RVOT:                  Appears normal         Lower Extremities:      Appears normal
LVOT:                  Appears normal

Other:  Appears to be male Both heels appear wnls   One 5th digit seen
Difficult exam due to fetal position and late gest. age
Cervix Uterus Adnexa

Cervix
Not visualized (advanced GA >13wks)

Uterus
No abnormality visualized.

Left Ovary
Not visualized.

Right Ovary
Not visualized.

Adnexa:       No adnexal mass visualized.
Impression

Single IUP at 33w 4d
Somewhat limited views of the fetal anatomy obtained due to
late gestational age
No gross anomalies noted
The estimated fetal weight is at the 43rd %tile
Posterior placenta without previa
Normal amniotic fluid volume
Recommendations

Consider ultrasound in 4 weeks for interval growth given late
onset of care

## 2017-02-02 ENCOUNTER — Telehealth: Payer: Self-pay | Admitting: *Deleted

## 2017-02-02 ENCOUNTER — Other Ambulatory Visit: Payer: Self-pay | Admitting: Obstetrics and Gynecology

## 2017-02-02 NOTE — Telephone Encounter (Signed)
Called pt to inquire if she has delivered her baby. She said she has not. She confirmed good FM daily from the baby. I advised that proper prenatal care is important and we are concerned since she is now 1 week past her due date and has not been seen since 1/30. I stated that typically we offer pt's to have induction of labor at this point in the pregnancy. Pt stated that she was planning to come to [facility] today to have her cervix checked. I advised that if she is agreeable to induction of labor within the next 24 hours, that would not be necessary. Pt happily agreed and I was able to schedule IOL for 2/10 @ midnight. She was advised that she may continue a normal diet until 10pm tonight. After that, she should have water only. Pt had additional questions regarding the induction process which were answered to her satisfaction. She expressed gratitude, voiced understanding and agreed to plan of care.

## 2017-02-03 ENCOUNTER — Inpatient Hospital Stay (HOSPITAL_COMMUNITY)
Admission: RE | Admit: 2017-02-03 | Discharge: 2017-02-04 | DRG: 775 | Disposition: A | Discharge status: Home / Self Care | Payer: Medicaid Other | Source: Ambulatory Visit | Attending: Obstetrics & Gynecology | Admitting: Obstetrics & Gynecology

## 2017-02-03 ENCOUNTER — Inpatient Hospital Stay (HOSPITAL_COMMUNITY): Payer: Medicaid Other | Admitting: Anesthesiology

## 2017-02-03 ENCOUNTER — Encounter (HOSPITAL_COMMUNITY): Payer: Self-pay

## 2017-02-03 DIAGNOSIS — F1290 Cannabis use, unspecified, uncomplicated: Secondary | ICD-10-CM | POA: Diagnosis present

## 2017-02-03 DIAGNOSIS — O480 Post-term pregnancy: Principal | ICD-10-CM | POA: Diagnosis present

## 2017-02-03 DIAGNOSIS — Z87891 Personal history of nicotine dependence: Secondary | ICD-10-CM

## 2017-02-03 DIAGNOSIS — Z141 Cystic fibrosis carrier: Secondary | ICD-10-CM | POA: Diagnosis not present

## 2017-02-03 DIAGNOSIS — F1210 Cannabis abuse, uncomplicated: Secondary | ICD-10-CM

## 2017-02-03 DIAGNOSIS — Z3A41 41 weeks gestation of pregnancy: Secondary | ICD-10-CM

## 2017-02-03 DIAGNOSIS — O99324 Drug use complicating childbirth: Secondary | ICD-10-CM | POA: Diagnosis present

## 2017-02-03 DIAGNOSIS — O0930 Supervision of pregnancy with insufficient antenatal care, unspecified trimester: Secondary | ICD-10-CM

## 2017-02-03 DIAGNOSIS — Z833 Family history of diabetes mellitus: Secondary | ICD-10-CM

## 2017-02-03 LAB — CBC
HCT: 30.1 % — ABNORMAL LOW (ref 36.0–46.0)
HEMOGLOBIN: 10.1 g/dL — AB (ref 12.0–15.0)
MCH: 27.9 pg (ref 26.0–34.0)
MCHC: 33.6 g/dL (ref 30.0–36.0)
MCV: 83.1 fL (ref 78.0–100.0)
Platelets: 217 10*3/uL (ref 150–400)
RBC: 3.62 MIL/uL — ABNORMAL LOW (ref 3.87–5.11)
RDW: 16.3 % — AB (ref 11.5–15.5)
WBC: 8.4 10*3/uL (ref 4.0–10.5)

## 2017-02-03 LAB — TYPE AND SCREEN
ABO/RH(D): O POS
Antibody Screen: NEGATIVE

## 2017-02-03 LAB — GROUP B STREP BY PCR: Group B strep by PCR: NEGATIVE

## 2017-02-03 LAB — OB RESULTS CONSOLE GBS: STREP GROUP B AG: NEGATIVE

## 2017-02-03 LAB — RPR: RPR: NONREACTIVE

## 2017-02-03 MED ORDER — ONDANSETRON HCL 4 MG PO TABS: 4.0000 mg | ORAL_TABLET | ORAL | Status: DC | PRN

## 2017-02-03 MED ORDER — OXYCODONE-ACETAMINOPHEN 5-325 MG PO TABS: 2.0000 | ORAL_TABLET | ORAL | Status: DC | PRN

## 2017-02-03 MED ORDER — EPHEDRINE 5 MG/ML INJ
10.0000 mg | INTRAVENOUS | Status: DC | PRN
  Filled 2017-02-03: qty 4

## 2017-02-03 MED ORDER — PHENYLEPHRINE 40 MCG/ML (10ML) SYRINGE FOR IV PUSH (FOR BLOOD PRESSURE SUPPORT)
80.0000 ug | PREFILLED_SYRINGE | INTRAVENOUS | Status: DC | PRN
  Filled 2017-02-03: qty 5
  Filled 2017-02-03: qty 10

## 2017-02-03 MED ORDER — ACETAMINOPHEN 325 MG PO TABS: 650.0000 mg | ORAL_TABLET | ORAL | Status: DC | PRN

## 2017-02-03 MED ORDER — LIDOCAINE HCL (PF) 1 % IJ SOLN
INTRAMUSCULAR | Status: DC | PRN
  Administered 2017-02-03 (×2): 6 mL via EPIDURAL

## 2017-02-03 MED ORDER — OXYCODONE-ACETAMINOPHEN 5-325 MG PO TABS: 1.0000 | ORAL_TABLET | ORAL | Status: DC | PRN

## 2017-02-03 MED ORDER — OXYTOCIN 40 UNITS IN LACTATED RINGERS INFUSION - SIMPLE MED
2.5000 [IU]/h | INTRAVENOUS | Status: DC
  Filled 2017-02-03: qty 1000

## 2017-02-03 MED ORDER — COCONUT OIL OIL
1.0000 | TOPICAL_OIL | Status: DC | PRN
Start: 2017-02-03 — End: 2017-02-04

## 2017-02-03 MED ORDER — BUPIVACAINE HCL (PF) 0.25 % IJ SOLN
INTRAMUSCULAR | Status: DC | PRN
  Administered 2017-02-03: 5 mL via EPIDURAL

## 2017-02-03 MED ORDER — DIPHENHYDRAMINE HCL 50 MG/ML IJ SOLN: 12.5000 mg | INTRAMUSCULAR | Status: DC | PRN

## 2017-02-03 MED ORDER — IBUPROFEN 600 MG PO TABS
600.0000 mg | ORAL_TABLET | Freq: Four times a day (QID) | ORAL | Status: DC
  Administered 2017-02-03 – 2017-02-04 (×5): 600 mg via ORAL
  Filled 2017-02-03 (×5): qty 1

## 2017-02-03 MED ORDER — TETANUS-DIPHTH-ACELL PERTUSSIS 5-2.5-18.5 LF-MCG/0.5 IM SUSP: 0.5000 mL | Freq: Once | INTRAMUSCULAR | Status: DC

## 2017-02-03 MED ORDER — DIPHENHYDRAMINE HCL 25 MG PO CAPS: 25.0000 mg | ORAL_CAPSULE | Freq: Four times a day (QID) | ORAL | Status: DC | PRN

## 2017-02-03 MED ORDER — SIMETHICONE 80 MG PO CHEW: 80.0000 mg | CHEWABLE_TABLET | ORAL | Status: DC | PRN

## 2017-02-03 MED ORDER — OXYTOCIN 40 UNITS IN LACTATED RINGERS INFUSION - SIMPLE MED
1.0000 m[IU]/min | INTRAVENOUS | Status: DC
  Administered 2017-02-03: 2 m[IU]/min via INTRAVENOUS

## 2017-02-03 MED ORDER — OXYTOCIN BOLUS FROM INFUSION
500.0000 mL | Freq: Once | INTRAVENOUS | Status: AC
  Administered 2017-02-03: 500 mL via INTRAVENOUS

## 2017-02-03 MED ORDER — LACTATED RINGERS IV SOLN
500.0000 mL | Freq: Once | INTRAVENOUS | Status: AC
  Administered 2017-02-03: 500 mL via INTRAVENOUS

## 2017-02-03 MED ORDER — SOD CITRATE-CITRIC ACID 500-334 MG/5ML PO SOLN: 30.0000 mL | ORAL | Status: DC | PRN

## 2017-02-03 MED ORDER — LIDOCAINE HCL (PF) 1 % IJ SOLN
30.0000 mL | INTRAMUSCULAR | Status: DC | PRN
  Filled 2017-02-03: qty 30

## 2017-02-03 MED ORDER — LACTATED RINGERS IV SOLN: 500.0000 mL | INTRAVENOUS | Status: DC | PRN

## 2017-02-03 MED ORDER — ZOLPIDEM TARTRATE 5 MG PO TABS: 5.0000 mg | ORAL_TABLET | Freq: Every evening | ORAL | Status: DC | PRN

## 2017-02-03 MED ORDER — ONDANSETRON HCL 4 MG/2ML IJ SOLN
4.0000 mg | Freq: Four times a day (QID) | INTRAMUSCULAR | Status: DC | PRN
  Administered 2017-02-03: 4 mg via INTRAVENOUS
  Filled 2017-02-03: qty 2

## 2017-02-03 MED ORDER — ONDANSETRON HCL 4 MG/2ML IJ SOLN: 4.0000 mg | INTRAMUSCULAR | Status: DC | PRN

## 2017-02-03 MED ORDER — FENTANYL CITRATE (PF) 100 MCG/2ML IJ SOLN
100.0000 ug | INTRAMUSCULAR | Status: DC | PRN
  Filled 2017-02-03: qty 2

## 2017-02-03 MED ORDER — PRENATAL MULTIVITAMIN CH
1.0000 | ORAL_TABLET | Freq: Every day | ORAL | Status: DC
  Administered 2017-02-03 – 2017-02-04 (×2): 1 via ORAL
  Filled 2017-02-03 (×2): qty 1

## 2017-02-03 MED ORDER — PHENYLEPHRINE 40 MCG/ML (10ML) SYRINGE FOR IV PUSH (FOR BLOOD PRESSURE SUPPORT)
80.0000 ug | PREFILLED_SYRINGE | INTRAVENOUS | Status: DC | PRN
  Administered 2017-02-03 (×2): 80 ug via INTRAVENOUS
  Filled 2017-02-03: qty 10
  Filled 2017-02-03: qty 5
  Filled 2017-02-03: qty 10

## 2017-02-03 MED ORDER — OXYCODONE HCL 5 MG PO TABS
5.0000 mg | ORAL_TABLET | ORAL | Status: DC | PRN
  Administered 2017-02-03: 5 mg via ORAL
  Filled 2017-02-03: qty 1

## 2017-02-03 MED ORDER — WITCH HAZEL-GLYCERIN EX PADS: 1.0000 "application " | MEDICATED_PAD | CUTANEOUS | Status: DC | PRN

## 2017-02-03 MED ORDER — BUPIVACAINE HCL (PF) 0.25 % IJ SOLN
INTRAMUSCULAR | Status: AC
  Filled 2017-02-03: qty 30

## 2017-02-03 MED ORDER — TERBUTALINE SULFATE 1 MG/ML IJ SOLN: 0.2500 mg | Freq: Once | INTRAMUSCULAR | Status: DC | PRN

## 2017-02-03 MED ORDER — LACTATED RINGERS IV SOLN
INTRAVENOUS | Status: DC
  Administered 2017-02-03 (×2): via INTRAVENOUS

## 2017-02-03 MED ORDER — BENZOCAINE-MENTHOL 20-0.5 % EX AERO
1.0000 "application " | INHALATION_SPRAY | CUTANEOUS | Status: DC | PRN
  Administered 2017-02-03: 1 via TOPICAL
  Filled 2017-02-03: qty 56

## 2017-02-03 MED ORDER — DIBUCAINE 1 % RE OINT: 1.0000 "application " | TOPICAL_OINTMENT | RECTAL | Status: DC | PRN

## 2017-02-03 MED ORDER — FENTANYL 2.5 MCG/ML BUPIVACAINE 1/10 % EPIDURAL INFUSION (WH - ANES)
14.0000 mL/h | INTRAMUSCULAR | Status: DC | PRN
  Administered 2017-02-03: 14 mL/h via EPIDURAL
  Filled 2017-02-03: qty 100

## 2017-02-03 MED ORDER — SENNOSIDES-DOCUSATE SODIUM 8.6-50 MG PO TABS
2.0000 | ORAL_TABLET | ORAL | Status: DC
  Administered 2017-02-04: 2 via ORAL
  Filled 2017-02-03: qty 2

## 2017-02-03 NOTE — H&P (Signed)
Stacy Peterson is a 26 y.o. female [phone] @ 41.1wks by 10wk scan presenting for IOL due to postterm. Denies leaking or bldg. 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 Denies s/s HSV outbreak.  OB History    Gravida Para Term Preterm AB Living   5 4 3 1  0 3   SAB TAB Ectopic Multiple Live Births   0 0     3     Past Medical History:  Diagnosis Date  . Bipolar 1 disorder (HCC)   . Chlamydia   . Depression   . Preterm labor   . Schizophrenia (HCC)   . Shingles   . Trichomonas vaginitis    Past Surgical History:  Procedure Laterality Date  . KNEE SURGERY     Family History: family history includes Diabetes in her paternal grandfather and paternal grandmother. Social History:  reports that she has quit smoking. Her smoking use included Cigarettes. She smoked 0.25 packs per day. She has never used smokeless tobacco. She reports that she uses drugs, including Marijuana. She reports that she does not drink alcohol.     Maternal Diabetes: No- did not do Genetic Screening: Declined Maternal Ultrasounds/Referrals: Normal Fetal Ultrasounds or other Referrals:  None Maternal Substance Abuse:  Yes:  Type: Marijuana Significant Maternal Medications:  None Significant Maternal Lab Results:  Lab values include: Other: GBS unknown- declined; agreed to PCR- neg Other Comments:  late onset of care @ 32wks with 3 visits total; no DM testing  ROS History   Blood pressure 118/80, pulse 89, temperature 98.2 F (36.8 C), temperature source Oral, resp. rate 18, last menstrual period 06/29/2016. Exam Physical Exam  Constitutional: She is oriented to person, place, and time. She appears well-developed.  HENT:  Head: Normocephalic.  Neck: Normal range of motion.  Cardiovascular: Normal rate.   Respiratory: Effort normal.   GI:  EFM 120s, +accels, no decels Ctx/irritability irreg q 3-6 mins  Musculoskeletal: Normal range of motion.  Neurological: She is alert and oriented to person, place, and time.  Skin: Skin is warm and dry.  Psychiatric: She has a normal mood and affect. Her behavior is normal. Thought content normal.    Prenatal labs: ABO, Rh: O/POS/-- (12/13 1639) Antibody: NEG (12/13 1639) Rubella: 1.17 (12/13 1639) RPR: NON REAC (12/13 1639)  HBsAg: NEGATIVE (12/13 1639)  HIV: NONREACTIVE (12/13 1639)  GBS:   PCR neg  Assessment/Plan: IUP@41 .1wks GBS unk Cx favorable  [facility] method GBS PCR if pt agreeable Anticipate SVD    Stacy Peterson 02/03/2017, 1:00 AM

## 2017-02-03 NOTE — Anesthesia Pain Management Evaluation Note (Signed)
  CRNA Pain Management Visit Note  Patient: Stacy Peterson, 26 y.o., female  "Hello I am a member of the anesthesia team at [facility]. We have an anesthesia team available at all times to provide care throughout the hospital, including epidural management and anesthesia for C-section. I don't know your plan for the delivery whether it a natural birth, water birth, IV sedation, nitrous supplementation, doula or epidural, but we want to meet your pain goals."   1.Was your pain managed to your expectations on prior hospitalizations?   Yes   2.What is your expectation for pain management during this hospitalization?     Epidural  3.How can we help you reach that goal? Epidural bolused  Record the patient's initial score and the patient's pain goal.   Pain: 5  Pain Goal: 5 [facility] wants you to be able to say your pain was always managed very well.  [facility] CenterMERRITT,Stacy Peterson 02/03/2017

## 2017-02-03 NOTE — Anesthesia Preprocedure Evaluation (Signed)
Anesthesia Evaluation  Patient identified by MRN, date of birth, ID band Patient awake    Reviewed: Allergy & Precautions, H&P , NPO status , Patient's Chart, lab work & pertinent test results  Airway Mallampati: I  TM Distance: >3 FB Neck ROM: full    Dental no notable dental hx.    Pulmonary neg pulmonary ROS, former smoker,    Pulmonary exam normal        Cardiovascular negative cardio ROS Normal cardiovascular exam     Neuro/Psych negative neurological ROS     GI/Hepatic negative GI ROS, Neg liver ROS,   Endo/Other  negative endocrine ROS  Renal/GU negative Renal ROS     Musculoskeletal   Abdominal Normal abdominal exam  (+)   Peds  Hematology negative hematology ROS (+)   Anesthesia Other Findings   Reproductive/Obstetrics (+) Pregnancy                             Anesthesia Physical Anesthesia Plan  ASA: II  Anesthesia Plan: Epidural   Post-op Pain Management:    Induction:   Airway Management Planned:   Additional Equipment:   Intra-op Plan:   Post-operative Plan:   Informed Consent: I have reviewed the patients History and Physical, chart, labs and discussed the procedure including the risks, benefits and alternatives for the proposed anesthesia with the patient or authorized representative who has indicated his/her understanding and acceptance.     Plan Discussed with:   Anesthesia Plan Comments:         Anesthesia Quick Evaluation

## 2017-02-03 NOTE — Anesthesia Procedure Notes (Signed)
Epidural Patient location during procedure: OB Start time: 02/03/2017 5:08 AM End time: 02/03/2017 5:11 AM  Staffing Anesthesiologist: Leilani AbleHATCHETT, Caterra Ostroff Performed: anesthesiologist   Preanesthetic Checklist Completed: patient identified, surgical consent, pre-op evaluation, timeout performed, IV checked, risks and benefits discussed and monitors and equipment checked  Epidural Patient position: sitting Prep: site prepped and draped and DuraPrep Patient monitoring: continuous pulse ox and blood pressure Approach: midline Location: L3-L4 Injection technique: LOR air  Needle:  Needle type: Tuohy  Needle gauge: 17 G Needle length: 9 cm and 9 Needle insertion depth: 5 cm cm Catheter type: closed end flexible Catheter size: 19 Gauge Catheter at skin depth: 10 cm Test dose: negative and Other  Assessment Sensory level: T9 Events: blood not aspirated, injection not painful, no injection resistance, negative IV test and no paresthesia  Additional Notes Reason for block:procedure for pain

## 2017-02-03 NOTE — Progress Notes (Signed)
CSW acknowledges consult. CSW attempted to meet with [name] at bedside to complete assessment for consult regarding drug exposed new borns; however, [name] was not up to participating in assessment at this time and requested that CSW return Monday, when her lawyer is present and adoption paperwork will be signed. CSW will attempt to meet with [name] on Monday per her request.   Stacy Peterson, MSW, LCSW-A Clinical Social Worker  [facility]  Office: [phone]

## 2017-02-03 NOTE — Anesthesia Postprocedure Evaluation (Signed)
Anesthesia Post Note  Patient: Nechama GuardRikki J Mattice  Procedure(s) Performed: * No procedures listed *  Patient location during evaluation: Mother Baby Anesthesia Type: Epidural Level of consciousness: awake and alert and oriented Pain management: satisfactory to patient Vital Signs Assessment: post-procedure vital signs reviewed and stable Respiratory status: spontaneous breathing and nonlabored ventilation Cardiovascular status: stable Postop Assessment: no headache, no backache, no signs of nausea or vomiting, adequate PO intake and patient able to bend at knees (patient up walking) Anesthetic complications: no        Last Vitals:  Vitals:   02/03/17 0915 02/03/17 1015  BP: 95/65 99/61  Pulse: 62 65  Resp: 18 16  Temp: 36.4 C 36.8 C    Last Pain:  Vitals:   02/03/17 1230  TempSrc:   PainSc: 0-No pain   Pain Goal:                 Christabelle Hanzlik

## 2017-02-04 ENCOUNTER — Encounter (HOSPITAL_COMMUNITY): Payer: Self-pay

## 2017-02-04 ENCOUNTER — Encounter (HOSPITAL_COMMUNITY)
Admission: RE | Disposition: A | Discharge status: Home / Self Care | Payer: Self-pay | Source: Ambulatory Visit | Attending: Obstetrics & Gynecology

## 2017-02-04 DIAGNOSIS — F1210 Cannabis abuse, uncomplicated: Secondary | ICD-10-CM

## 2017-02-04 DIAGNOSIS — Z3A41 41 weeks gestation of pregnancy: Secondary | ICD-10-CM

## 2017-02-04 SURGERY — LIGATION, FALLOPIAN TUBE, POSTPARTUM
Anesthesia: Choice | Laterality: Bilateral

## 2017-02-04 MED ORDER — BENZOCAINE-MENTHOL 20-0.5 % EX AERO
1.0000 | INHALATION_SPRAY | CUTANEOUS | 1 refills | Status: DC | PRN
Start: 2017-02-04 — End: 2019-03-17

## 2017-02-04 MED ORDER — PRENATAL MULTIVITAMIN CH
1.0000 | ORAL_TABLET | Freq: Every day | ORAL | 11 refills | Status: DC
Start: 2017-02-05 — End: 2019-03-17

## 2017-02-04 MED ORDER — IBUPROFEN 600 MG PO TABS: 600.0000 mg | ORAL_TABLET | Freq: Four times a day (QID) | ORAL | 0 refills | Status: DC

## 2017-02-04 NOTE — Progress Notes (Signed)
Up on entering patients room, this writer found the urine collection kit ie cotton bulbs and blastic cup on the computer. The cotton bulbs were dry, there was no urine present. Pt stated that she "took it out of the diaper because the baby had a stool and needed to be changed". I replaced the cotton bulbs and instructed pt to call me the next time the baby needs to be changed. She agreed. We will continue to monitor.

## 2017-02-04 NOTE — Plan of Care (Signed)
Problem: Role Relationship: Goal: Ability to demonstrate positive interaction with newborn will improve Outcome: Completed/Met Date Met: 02/04/17 Mother continues to hold and feed baby while adopted parent interact with baby also.  Comments: Birth mother continues to hold and feed baby while adopted mother interact with baby also.

## 2017-02-04 NOTE — Clinical Social Work Maternal (Signed)
CLINICAL SOCIAL WORK MATERNAL/CHILD NOTE  Patient Details  Name: Stacy Peterson MRN: [id number] Date of Birth: 02/28/1991  Date:  02/04/2017  Clinical Social Worker Initiating Note:   , MSW, LCSW-A   Date/ Time Initiated:  02/04/17/1532              Child's Name:  Unknown    Legal Guardian:  Other (Comment) ([place] and Jessica (adoptive parents ) )   Need for Interpreter:  None   Date of Referral:  02/02/17     Reason for Referral:  Adoption , Current Substance Use/Substance Use During Pregnancy    Referral Source:  RN   Address:  [address] [place] [address]  Phone number:  [phone]   Household Members: Self   Natural Supports (not living in the home): Immediate Family, Friends, Extended Family   Professional Supports:None   Employment:    Type of Work:     Education:      Financial Resources:Medicaid   Other Resources:     Cultural/Religious Considerations Which May Impact Care: None reported.   Strengths:     Risk Factors/Current Problems: Substance Use    Cognitive State: Alert , Able to Concentrate , Goal Oriented , Insightful    Mood/Affect: Calm , Comfortable , Interested    CSW Assessment: CSW met with [name] at bedside to complete assessment regarding substance use and adoption. Upon this writers arrival, [name] was pleasant and welcoming. This writer explained role and reasoning for visit. [name] notes she has not used substance recently thus is not concerned for baby. This writer informed [name] of UDS and CDS taken of baby and mandatory reporting. [name] verbalizes understanding noting that baby is going to be adopted thus she will not be the care giver of child. [name] notes adoptive parents are Jessica and [place]. CSW informed [name] that she would nee da copy of the consent to adoption paperwork. [name] provided this to CSW. CSW placed a copy on baby's chart along with copy's of adoptive parents  photo Id's. CSW also obtained a copy of signed request for access forms and placed in baby and [name]'s chart. At this time, no other needs addressed or requested. Case closed to this CSW.   CSW Plan/Description: No Further Intervention Required/No Barriers to Discharge     , MSW, LCSW-A Clinical Social Worker  [facility]  Office: [phone]    CLINICAL SOCIAL WORK MATERNAL/CHILD NOTE  Patient Details  Name: Stacy Peterson MRN: [id number] Date of Birth: 08/27/1991  Date:  02/04/2017  Clinical Social Worker Initiating Note:  Stacy Peterson, MSW, LCSW-A  Date/ Time Initiated:  02/04/17/1532     Child's Name:  Unknown    Legal Guardian:  Other (Comment) ([place] and Janett [place]ow (adoptive parents ) )   Need for Interpreter:  None   Date of Referral:  02/02/17     Reason for Referral:  Adoption , Current Substance Use/Substance Use During Pregnancy    Referral Source:  RN   Address:  [place] [address]  Phone number:  [phone]   Household Members:  Self   Natural Supports (not living in the home):  Immediate Family, Friends, Extended Family   Professional Supports: None   Employment:     Type of Work:     Education:      Pensions consultant:  [employer]   Other Resources:      Cultural/Religious Considerations Which May Impact Care:  None reported.   Strengths:      Risk Factors/Current Problems:  Substance Use    Cognitive State:  Alert , Able to Concentrate , Goal Oriented , Insightful    Mood/Affect:  Calm , Comfortable , Interested    CSW Assessment: CSW met with [name] at bedside to complete assessment regarding substance use and adoption. Upon this writers arrival, [name] was pleasant and welcoming. This Probation officer explained role and reasoning for visit. [name] notes she has not used substance recently thus is not concerned for baby. This Probation officer informed [name] of UDS and CDS taken of baby and mandatory reporting. [name] verbalizes understanding noting that baby is going to be adopted thus she will not be the care giver of child. [name] notes adoptive parents are [place] and [place]. CSW informed [name] that she would nee da copy of the consent to adoption paperwork. [name] provided this to CSW. CSW placed a copy on baby's chart along with copy's of adoptive parents photo Id's. CSW also obtained a  copy of signed request for access forms and placed in baby and [name]'s chart. At this time, no other needs addressed or requested. Case closed to this CSW.   CSW Plan/Description:  No Further Intervention Required/No Barriers to Discharge    Oda Cogan, MSW, [facility]  Office: [phone]

## 2017-02-04 NOTE — Progress Notes (Signed)
Arline [facility]Cindy [facility] called regarding information on the birth registration form. Patient was asleep and so she will be coming to see patient at approximately 0700 AM today.

## 2017-02-04 NOTE — Discharge Summary (Signed)
OB Discharge Summary  Patient Name: Stacy Peterson DOB: 1991-08-21 MRN: [id number]  Date of admission: 02/03/2017 Delivering MD: Cam Hai D   Date of discharge: 02/04/2017  Admitting diagnosis: Induction desires sterilization Intrauterine pregnancy: [redacted]w[redacted]d     Secondary diagnosis:Active Problems:   Post-dates pregnancy  Additional problems:none     Discharge diagnosis: Term Pregnancy Delivered                                                                     Post partum procedures:none  Augmentation: Pitocin  Complications: None  Hospital course:  Induction of Labor With Vaginal Delivery   26 y.o. yo [phone] at [redacted]w[redacted]d was admitted to the hospital 02/03/2017 for induction of labor.  Indication for induction: Postdates.  Patient had an uncomplicated labor course as follows: Membrane Rupture Time/Date: 6:27 AM ,02/03/2017   Intrapartum Procedures: Episiotomy: None [1]                                         Lacerations:  None [1]  Patient had delivery of a Viable infant.  Information for the patient's newborn:  Rosi, Secrist [[id number]]  Delivery Method: Vag-Spont   02/03/2017  Details of delivery can be found in separate delivery note.  Patient had a routine postpartum course. Patient is discharged home 02/04/17.  Physical exam  Vitals:   02/03/17 1015 02/03/17 1448 02/03/17 2127 02/04/17 0803  BP: 99/61 96/60 107/63 (!) 98/54  Pulse: 65 68 62 (!) 58  Resp: 16 17 16 16   Temp: 98.3 F (36.8 C) 98.6 F (37 C) 98.4 F (36.9 C) 98.7 F (37.1 C)  TempSrc: Oral Oral Oral Oral  SpO2: 100%  99% 97%  Weight:      Height:       General: alert, cooperative and no distress Lochia: appropriate Uterine Fundus: firm Incision: N/A DVT Evaluation: No evidence of DVT seen on physical exam. Labs: Lab Results  Component Value Date   WBC 8.4 02/03/2017   HGB 10.1 (L) 02/03/2017   HCT 30.1 (L) 02/03/2017   MCV 83.1 02/03/2017   PLT 217 02/03/2017   CMP Latest  Ref Rng & Units 11/23/2016  Glucose 65 - 99 mg/dL 84  BUN 6 - 20 mg/dL 6  Creatinine 1.30 - 8.65 mg/dL 7.84(O)  Sodium 962 - 952 mmol/L 136  Potassium 3.5 - 5.1 mmol/L 3.8  Chloride 101 - 111 mmol/L 104  CO2 22 - 32 mmol/L 21(L)  Calcium 8.9 - 10.3 mg/dL 9.0  Total Protein 6.5 - 8.1 g/dL 6.7  Total Bilirubin 0.3 - 1.2 mg/dL 8.4(X)  Alkaline Phos 38 - 126 U/L 91  AST 15 - 41 U/L 14(L)  ALT 14 - 54 U/L 8(L)    Discharge instruction: per After Visit Summary and "Baby and Me Booklet".  After Visit Meds:  Allergies as of 02/04/2017   No Known Allergies     Medication List    TAKE these medications   benzocaine-Menthol 20-0.5 % Aero Commonly known as:  DERMOPLAST Apply 1 application topically as needed for irritation (perineal discomfort).   ibuprofen 600 MG tablet Commonly known  as:  ADVIL,MOTRIN Take 1 tablet (600 mg total) by mouth every 6 (six) hours.   prenatal multivitamin Tabs tablet Take 1 tablet by mouth daily at 12 noon. Start taking on:  02/05/2017       Diet: routine diet  Activity: Advance as tolerated. Pelvic rest for 6 weeks.   Outpatient follow up:6 weeks Follow up Appt:No future appointments. Follow up visit: No Follow-up on file.  Postpartum contraception: Undecided  Newborn Data: Live born female  Birth Weight: 7 lb 3.4 oz (3272 g) APGAR: 8, 9  Baby Feeding: Bottle Disposition:BUFA   02/04/2017 Wyvonnia DuskyMarie Siriyah Ambrosius, CNM

## 2017-02-04 NOTE — Progress Notes (Signed)
Discharge teaching complete. Pt understood all information and did not have any questions. Pt ambulated out of the hospital and was discharged home to family.

## 2017-02-05 ENCOUNTER — Encounter: Payer: Self-pay | Admitting: General Practice

## 2017-03-14 ENCOUNTER — Ambulatory Visit: Payer: Self-pay | Admitting: Advanced Practice Midwife

## 2017-03-20 ENCOUNTER — Ambulatory Visit: Payer: Self-pay | Admitting: Advanced Practice Midwife

## 2018-02-08 ENCOUNTER — Emergency Department (HOSPITAL_COMMUNITY): Admission: EM | Admit: 2018-02-08 | Discharge: 2018-02-08 | Discharge status: Against Medical Advice | Payer: Self-pay

## 2018-02-08 NOTE — ED Triage Notes (Signed)
Called for triage. No response. 

## 2018-02-08 NOTE — ED Notes (Signed)
Pt called for triage, no response from lobby 

## 2018-04-30 ENCOUNTER — Encounter: Payer: Self-pay | Admitting: *Deleted

## 2018-06-07 ENCOUNTER — Emergency Department (HOSPITAL_COMMUNITY)
Admission: EM | Admit: 2018-06-07 | Discharge: 2018-06-07 | Disposition: A | Discharge status: Home / Self Care | Payer: Medicaid Other | Attending: Emergency Medicine | Admitting: Emergency Medicine

## 2018-06-07 ENCOUNTER — Encounter (HOSPITAL_COMMUNITY): Payer: Self-pay

## 2018-06-07 ENCOUNTER — Other Ambulatory Visit: Payer: Self-pay

## 2018-06-07 DIAGNOSIS — Z5321 Procedure and treatment not carried out due to patient leaving prior to being seen by health care provider: Secondary | ICD-10-CM | POA: Insufficient documentation

## 2018-06-07 DIAGNOSIS — R112 Nausea with vomiting, unspecified: Secondary | ICD-10-CM | POA: Insufficient documentation

## 2018-06-07 DIAGNOSIS — R197 Diarrhea, unspecified: Secondary | ICD-10-CM | POA: Insufficient documentation

## 2018-06-07 LAB — COMPREHENSIVE METABOLIC PANEL
ALBUMIN: 4.9 g/dL (ref 3.5–5.0)
ALT: 14 U/L (ref 14–54)
ANION GAP: 7 (ref 5–15)
AST: 16 U/L (ref 15–41)
Alkaline Phosphatase: 66 U/L (ref 38–126)
BILIRUBIN TOTAL: 0.7 mg/dL (ref 0.3–1.2)
BUN: 12 mg/dL (ref 6–20)
CALCIUM: 9.7 mg/dL (ref 8.9–10.3)
CO2: 27 mmol/L (ref 22–32)
Chloride: 107 mmol/L (ref 101–111)
Creatinine, Ser: 0.86 mg/dL (ref 0.44–1.00)
GFR calc Af Amer: >60 mL/min (ref >60)
GFR calc non Af Amer: >60 mL/min (ref >60)
GLUCOSE: 104 mg/dL — AB (ref 65–99)
POTASSIUM: 3.8 mmol/L (ref 3.5–5.1)
Sodium: 141 mmol/L (ref 135–145)
TOTAL PROTEIN: 8.1 g/dL (ref 6.5–8.1)

## 2018-06-07 LAB — CBC
HEMATOCRIT: 40.9 % (ref 36.0–46.0)
Hemoglobin: 13.5 g/dL (ref 12.0–15.0)
MCH: 30.5 pg (ref 26.0–34.0)
MCHC: 33 g/dL (ref 30.0–36.0)
MCV: 92.5 fL (ref 78.0–100.0)
Platelets: 265 10*3/uL (ref 150–400)
RBC: 4.42 MIL/uL (ref 3.87–5.11)
RDW: 15.5 % (ref 11.5–15.5)
WBC: 8.8 10*3/uL (ref 4.0–10.5)

## 2018-06-07 LAB — LIPASE, BLOOD: Lipase: 28 U/L (ref 11–51)

## 2018-06-07 MED ORDER — ONDANSETRON 4 MG PO TBDP: 4.0000 mg | ORAL_TABLET | Freq: Once | ORAL | Status: DC | PRN

## 2018-06-07 NOTE — ED Notes (Signed)
No answer when called for recheck on vital signs 

## 2018-06-07 NOTE — ED Triage Notes (Signed)
Pt reports N/V/D since waking this morning. She reports 5-6 episodes of vomiting and diarrhea since this morning. A&Ox4. Ambulatory.

## 2018-06-10 NOTE — ED Notes (Signed)
Follow up call made  No answer  06/10/18 1225 s Verner Kopischke rn

## 2019-02-21 ENCOUNTER — Other Ambulatory Visit: Payer: Self-pay

## 2019-02-21 ENCOUNTER — Inpatient Hospital Stay (HOSPITAL_COMMUNITY): Payer: Self-pay

## 2019-02-21 ENCOUNTER — Emergency Department (HOSPITAL_COMMUNITY): Payer: Self-pay

## 2019-02-21 ENCOUNTER — Inpatient Hospital Stay (HOSPITAL_COMMUNITY)
Admission: EM | Admit: 2019-02-21 | Discharge: 2019-02-24 | DRG: 200 | Disposition: A | Discharge status: Home / Self Care | Payer: Self-pay | Attending: Surgery | Admitting: Surgery

## 2019-02-21 DIAGNOSIS — M25512 Pain in left shoulder: Secondary | ICD-10-CM | POA: Diagnosis present

## 2019-02-21 DIAGNOSIS — F209 Schizophrenia, unspecified: Secondary | ICD-10-CM | POA: Diagnosis present

## 2019-02-21 DIAGNOSIS — F319 Bipolar disorder, unspecified: Secondary | ICD-10-CM | POA: Diagnosis present

## 2019-02-21 DIAGNOSIS — T07XXXA Unspecified multiple injuries, initial encounter: Secondary | ICD-10-CM

## 2019-02-21 DIAGNOSIS — Z87891 Personal history of nicotine dependence: Secondary | ICD-10-CM

## 2019-02-21 DIAGNOSIS — S022XXA Fracture of nasal bones, initial encounter for closed fracture: Secondary | ICD-10-CM

## 2019-02-21 DIAGNOSIS — S1083XA Contusion of other specified part of neck, initial encounter: Secondary | ICD-10-CM | POA: Diagnosis present

## 2019-02-21 DIAGNOSIS — J939 Pneumothorax, unspecified: Secondary | ICD-10-CM

## 2019-02-21 DIAGNOSIS — S60212A Contusion of left wrist, initial encounter: Secondary | ICD-10-CM | POA: Diagnosis present

## 2019-02-21 DIAGNOSIS — S0922XA Traumatic rupture of left ear drum, initial encounter: Secondary | ICD-10-CM | POA: Diagnosis present

## 2019-02-21 DIAGNOSIS — S8012XA Contusion of left lower leg, initial encounter: Secondary | ICD-10-CM | POA: Diagnosis present

## 2019-02-21 DIAGNOSIS — S270XXA Traumatic pneumothorax, initial encounter: Principal | ICD-10-CM

## 2019-02-21 DIAGNOSIS — Z4682 Encounter for fitting and adjustment of non-vascular catheter: Secondary | ICD-10-CM

## 2019-02-21 DIAGNOSIS — S01311A Laceration without foreign body of right ear, initial encounter: Secondary | ICD-10-CM | POA: Diagnosis present

## 2019-02-21 DIAGNOSIS — S5012XA Contusion of left forearm, initial encounter: Secondary | ICD-10-CM | POA: Diagnosis present

## 2019-02-21 DIAGNOSIS — S20212A Contusion of left front wall of thorax, initial encounter: Secondary | ICD-10-CM | POA: Diagnosis present

## 2019-02-21 DIAGNOSIS — M25511 Pain in right shoulder: Secondary | ICD-10-CM | POA: Diagnosis present

## 2019-02-21 LAB — COMPREHENSIVE METABOLIC PANEL
ALBUMIN: 4.8 g/dL (ref 3.5–5.0)
ALT: 18 U/L (ref 0–44)
AST: 27 U/L (ref 15–41)
Alkaline Phosphatase: 72 U/L (ref 38–126)
Anion gap: 12 (ref 5–15)
BILIRUBIN TOTAL: 1.2 mg/dL (ref 0.3–1.2)
BUN: 12 mg/dL (ref 6–20)
CHLORIDE: 104 mmol/L (ref 98–111)
CO2: 21 mmol/L — ABNORMAL LOW (ref 22–32)
CREATININE: 0.86 mg/dL (ref 0.44–1.00)
Calcium: 9.7 mg/dL (ref 8.9–10.3)
GFR calc Af Amer: >60 mL/min (ref >60)
Glucose, Bld: 91 mg/dL (ref 70–99)
Potassium: 3.6 mmol/L (ref 3.5–5.1)
Sodium: 137 mmol/L (ref 135–145)
TOTAL PROTEIN: 8.2 g/dL — AB (ref 6.5–8.1)

## 2019-02-21 LAB — CBC
HEMATOCRIT: 39.2 % (ref 36.0–46.0)
Hemoglobin: 12.8 g/dL (ref 12.0–15.0)
MCH: 30.8 pg (ref 26.0–34.0)
MCHC: 32.7 g/dL (ref 30.0–36.0)
MCV: 94.2 fL (ref 80.0–100.0)
NRBC: 0 % (ref 0.0–0.2)
Platelets: 286 10*3/uL (ref 150–400)
RBC: 4.16 MIL/uL (ref 3.87–5.11)
RDW: 15 % (ref 11.5–15.5)
WBC: 11.6 10*3/uL — ABNORMAL HIGH (ref 4.0–10.5)

## 2019-02-21 LAB — I-STAT BETA HCG BLOOD, ED (MC, WL, AP ONLY): I-stat hCG, quantitative: <5 m[IU]/mL (ref <5)

## 2019-02-21 MED ORDER — LORAZEPAM 2 MG/ML IJ SOLN
1.0000 mg | Freq: Once | INTRAMUSCULAR | Status: AC
  Administered 2019-02-21: 1 mg via INTRAVENOUS

## 2019-02-21 MED ORDER — PRENATAL MULTIVITAMIN CH
1.0000 | ORAL_TABLET | Freq: Every day | ORAL | Status: DC
  Administered 2019-02-22 – 2019-02-23 (×2): 1 via ORAL
  Filled 2019-02-21 (×3): qty 1

## 2019-02-21 MED ORDER — IOPAMIDOL (ISOVUE-300) INJECTION 61%
INTRAVENOUS | Status: AC
  Filled 2019-02-21: qty 100

## 2019-02-21 MED ORDER — LIDOCAINE-EPINEPHRINE (PF) 2 %-1:200000 IJ SOLN
20.0000 mL | Freq: Once | INTRAMUSCULAR | Status: AC
  Administered 2019-02-21: 20 mL
  Filled 2019-02-21: qty 20

## 2019-02-21 MED ORDER — KETAMINE HCL 50 MG/5ML IJ SOSY
15.0000 mg | PREFILLED_SYRINGE | Freq: Once | INTRAMUSCULAR | Status: AC
  Administered 2019-02-21: 15 mg via INTRAVENOUS

## 2019-02-21 MED ORDER — SODIUM CHLORIDE 0.9 % IV SOLN
INTRAVENOUS | Status: DC
  Administered 2019-02-21 – 2019-02-23 (×6): via INTRAVENOUS

## 2019-02-21 MED ORDER — MORPHINE SULFATE (PF) 4 MG/ML IV SOLN
4.0000 mg | Freq: Once | INTRAVENOUS | Status: AC
  Administered 2019-02-21: 4 mg via INTRAVENOUS
  Filled 2019-02-21: qty 1

## 2019-02-21 MED ORDER — OXYCODONE HCL 5 MG PO TABS
5.0000 mg | ORAL_TABLET | ORAL | Status: DC | PRN
  Administered 2019-02-21: 5 mg via ORAL
  Administered 2019-02-22 – 2019-02-24 (×7): 10 mg via ORAL
  Filled 2019-02-21 (×7): qty 2
  Filled 2019-02-21: qty 1
  Filled 2019-02-21 (×3): qty 2

## 2019-02-21 MED ORDER — IOPAMIDOL (ISOVUE-300) INJECTION 61%
100.0000 mL | Freq: Once | INTRAVENOUS | Status: AC | PRN
  Administered 2019-02-21: 100 mL via INTRAVENOUS

## 2019-02-21 MED ORDER — ONDANSETRON HCL 4 MG/2ML IJ SOLN
4.0000 mg | Freq: Four times a day (QID) | INTRAMUSCULAR | Status: DC | PRN
  Administered 2019-02-22 – 2019-02-23 (×2): 4 mg via INTRAVENOUS
  Filled 2019-02-21 (×2): qty 2

## 2019-02-21 MED ORDER — KETAMINE HCL 50 MG/5ML IJ SOSY
30.0000 mg | PREFILLED_SYRINGE | Freq: Once | INTRAMUSCULAR | Status: DC
  Filled 2019-02-21: qty 5

## 2019-02-21 MED ORDER — LORAZEPAM 2 MG/ML IJ SOLN
INTRAMUSCULAR | Status: AC
  Administered 2019-02-21: 1 mg via INTRAVENOUS
  Filled 2019-02-21: qty 1

## 2019-02-21 MED ORDER — ACETAMINOPHEN 325 MG PO TABS
650.0000 mg | ORAL_TABLET | Freq: Four times a day (QID) | ORAL | Status: DC | PRN
  Administered 2019-02-22 – 2019-02-24 (×2): 650 mg via ORAL
  Filled 2019-02-21 (×2): qty 2

## 2019-02-21 MED ORDER — HYDRALAZINE HCL 20 MG/ML IJ SOLN: 10.0000 mg | INTRAMUSCULAR | Status: DC | PRN

## 2019-02-21 MED ORDER — MORPHINE SULFATE (PF) 2 MG/ML IV SOLN
2.0000 mg | INTRAVENOUS | Status: DC | PRN
  Administered 2019-02-21 – 2019-02-22 (×2): 2 mg via INTRAVENOUS
  Filled 2019-02-21 (×2): qty 1

## 2019-02-21 MED ORDER — HYDRALAZINE HCL 20 MG/ML IJ SOLN: 10.0000 mg | Freq: Four times a day (QID) | INTRAMUSCULAR | Status: DC | PRN

## 2019-02-21 NOTE — ED Notes (Signed)
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 spaces appear normal. No nerve root edema or effacement. No disc extrusion or stenosis. Upper chest: Large pneumothorax noted on the left. Visualized right lung clear. Other: None IMPRESSION: CT head: Study within normal limits. CT maxillofacial: 1. Fractures of the distal left and right nasal bones, comminuted distally. Major fracture fragments in near anatomic alignment. 2. Nondisplaced fracture proximal vomer, extending to the left and right sides. 3. No other fractures.  No dislocation. 4. Small air-fluid levels in each maxillary antrum. Mucosal thickening in several ethmoid air cells. Ostiomeatal unit complexes are patent bilaterally. Mild leftward deviation of the nasal septum. No nares obstruction. 5. Soft tissue edema throughout the face without well-defined hematoma. CT cervical spine: 1. Large pneumothorax on the left. Report called to referring physician at time chest and rib radiographs interpreted. 2.  No evident fracture or spondylolisthesis. 3. No nerve root edema or effacement. No disc  extrusion or stenosis. Electronically Signed   By: Bretta BangWilliam  Woodruff III M.D.   On: 02/21/2019 11:37   None  Pending Labs Unresulted Labs (From admission, onward)    Start     Ordered   02/22/19 0500  CBC  Tomorrow morning,   R     02/21/19 1318   02/22/19 0500  Basic metabolic panel  Tomorrow morning,   R     02/21/19 1318   02/21/19 1316  HIV antibody (Routine Testing)  Once,   R     02/21/19 1318          Vitals/Pain Today's Vitals   02/21/19 1900 02/21/19 1945 02/21/19 2000 02/21/19 2015  BP: 102/68 103/71 (!) 103/92 113/86  Pulse: 74 87 (!) 108 91  Resp: 16  (!) 22 (!) 21  Temp:      TempSrc:      SpO2: 100% 98% 100% 98%  PainSc:        Isolation Precautions No active isolations  Medications Medications  prenatal multivitamin tablet 1 tablet (has no administration in time range)  0.9 %  sodium chloride infusion (has no administration in time range)  iopamidol (ISOVUE-300) 61 % injection (has no administration in time range)  iopamidol (ISOVUE-300) 61 % injection (has no administration  in time range)  morphine 2 MG/ML injection 2 mg (2 mg Intravenous Given 02/21/19 2034)  oxyCODONE (Oxy IR/ROXICODONE) immediate release tablet 5-10 mg (has no administration in time range)  ondansetron (ZOFRAN) injection 4 mg (has no administration in time range)  hydrALAZINE (APRESOLINE) injection 10 mg (has no administration in time range)  acetaminophen (TYLENOL) tablet 650 mg (has no administration in time range)  morphine 4 MG/ML injection 4 mg (4 mg Intravenous Given 02/21/19 1014)  lidocaine-EPINEPHrine (XYLOCAINE W/EPI) 2 %-1:200000 (PF) injection 20 mL (20 mLs Infiltration Given by Other 02/21/19 1131)  morphine 4 MG/ML injection 4 mg (4 mg Intravenous Given 02/21/19 1154)  LORazepam (ATIVAN) injection 1 mg (1 mg Intravenous Given 02/21/19 1208)  morphine 4 MG/ML injection 4 mg (4 mg Intravenous Given 02/21/19 1313)  morphine 4 MG/ML injection 4 mg (4 mg Intravenous Given 02/21/19  1500)  iopamidol (ISOVUE-300) 61 % injection 100 mL (100 mLs Intravenous Contrast Given 02/21/19 1818)  morphine 4 MG/ML injection 4 mg (4 mg Intravenous Given 02/21/19 1638)  ketamine 50 mg in normal saline 5 mL (10 mg/mL) syringe (15 mg Intravenous Given 02/21/19 1749)    Mobility non-ambulatory (chest tube, multiple medication administration)

## 2019-02-21 NOTE — ED Notes (Signed)
[facility] at bedside for transport. 

## 2019-02-21 NOTE — ED Notes (Signed)
Patient transported to CT 

## 2019-02-21 NOTE — ED Notes (Signed)
Patient transported to X-ray and CT 

## 2019-02-21 NOTE — ED Notes (Signed)
[facility] contacted for transport  
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
## 2019-02-21 NOTE — ED Notes (Signed)
Pt called out to use bathroom. Pt has chest tube was offered bed pan or pure wick pt refused both.

## 2019-02-21 NOTE — ED Notes (Signed)
Attempted to take patient for CT scan. Pt unable to lay flat d/t pain and chest tube. MD aware.

## 2019-02-21 NOTE — ED Notes (Signed)
Nurse is giving blood, 3 minutes left, will call back for report.

## 2019-02-21 NOTE — ED Notes (Signed)
[employer] now at bedside speaking with patient.

## 2019-02-21 NOTE — ED Notes (Signed)
Attempted to call report. Patient not officially placed yet. Will get a call back in approximately 15 minutes.

## 2019-02-21 NOTE — ED Notes (Signed)
Pt refusing CT scan at this time. States she still cannot lay flat. States her pain meds are not working.

## 2019-02-21 NOTE — ED Notes (Signed)
No availability at [facility], [facility] paged, bed to be changed to 6[facility]

## 2019-02-21 NOTE — Progress Notes (Signed)
CSW aware of patient in ED- please call this CSW once appropriate to meet with patient:  [phone]  Stacy Gardner, LCSW Clinical Social Worker  [facility]  [phone]

## 2019-02-22 ENCOUNTER — Inpatient Hospital Stay (HOSPITAL_COMMUNITY): Payer: Self-pay

## 2019-02-22 MED ORDER — ALPRAZOLAM 0.25 MG PO TABS
0.2500 mg | ORAL_TABLET | Freq: Three times a day (TID) | ORAL | Status: DC | PRN
  Administered 2019-02-22 – 2019-02-24 (×5): 0.25 mg via ORAL
  Filled 2019-02-22 (×5): qty 1

## 2019-02-22 MED ORDER — SODIUM CHLORIDE 0.9 % IV BOLUS
1000.0000 mL | Freq: Once | INTRAVENOUS | Status: AC
  Administered 2019-02-22: 1000 mL via INTRAVENOUS

## 2019-02-22 MED ORDER — BACITRACIN ZINC 500 UNIT/GM EX OINT: TOPICAL_OINTMENT | CUTANEOUS | Status: DC | PRN

## 2019-02-22 MED ORDER — HYDROMORPHONE HCL 1 MG/ML IJ SOLN
1.0000 mg | INTRAMUSCULAR | Status: DC | PRN
  Administered 2019-02-22 – 2019-02-24 (×13): 1 mg via INTRAVENOUS
  Filled 2019-02-22 (×14): qty 1

## 2019-02-22 MED ORDER — BACITRACIN ZINC 500 UNIT/GM EX OINT
TOPICAL_OINTMENT | Freq: Two times a day (BID) | CUTANEOUS | Status: DC
  Administered 2019-02-22 – 2019-02-23 (×3): via TOPICAL
  Filled 2019-02-22: qty 28.35
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
## 2019-02-22 NOTE — Plan of Care (Signed)
  Problem: Nutrition: Goal: Adequate nutrition will be maintained Outcome: Progressing   Problem: Pain Managment: Goal: General experience of comfort will improve Outcome: Progressing   Problem: Skin Integrity: Goal: Risk for impaired skin integrity will decrease Outcome: Progressing   
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
## 2019-02-22 NOTE — Progress Notes (Signed)
Paged portable for a contionus pulse ox machine. The one at the bedside is not currently working.

## 2019-02-22 NOTE — Progress Notes (Signed)
Wasted 10mg  Oxycodone with Danford Bad, [employer] fund[employer].
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
## 2019-02-22 NOTE — Progress Notes (Signed)
Paged Dr. Janee Morn regarding BP 89/59. Orders to administer 1L NS bolus.

## 2019-02-23 ENCOUNTER — Inpatient Hospital Stay (HOSPITAL_COMMUNITY): Payer: Self-pay

## 2019-02-23 MED ORDER — SODIUM CHLORIDE 0.9 % IV BOLUS
1000.0000 mL | Freq: Once | INTRAVENOUS | Status: AC
  Administered 2019-02-23: 1000 mL via INTRAVENOUS

## 2019-02-23 NOTE — Progress Notes (Addendum)
Went in pt room for assessment and pt. stated the pain medicine we are giving her is not strong enough. Paged Dr. Sheliah Hatch regarding pain medication and was told pt. is not receiving any more pain medication. Pt. believes we are not treating her with the full dose of pain medication due to her health insurance status. Reinforced to the pt. that is not the case.

## 2019-02-23 NOTE — Progress Notes (Signed)
Dr. Sheliah Hatch notified of patient's BP 90/49- Patient is asymptomatic. Dr. Sheliah Hatch states to continue to monitor.

## 2019-02-23 NOTE — Progress Notes (Signed)
PT Cancellation Note  Patient Details Name: Stacy Peterson MRN: [id number] DOB: 1991-07-26   Cancelled Treatment:    Reason Eval/Treat Not Completed: Patient declined. Pt sleeping soundly upon entering room. Did not respond to name only, awoke to name and touch. Mumbled "maybe later" and drifted back to sleep. Acute PT to follow up later today as time allows vs another date.   Sallyanne Kuster, PTA, CLT Acute Rehab Services [phone] 02/23/19, 10:10 AM   Sallyanne Kuster 02/23/2019, 10:09 AM

## 2019-02-23 NOTE — Progress Notes (Signed)
Paged about pain and patient wanted chest tube removed. I called the nurse and had her put me on speaker to discuss with the patient but the patient was unwilling to discuss her issues with me at the time. -continue current therapy
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
## 2019-02-23 NOTE — Plan of Care (Signed)
  Problem: Clinical Measurements: Goal: Ability to maintain clinical measurements within normal limits will improve Outcome: Progressing Goal: Will remain free from infection Outcome: Progressing   Problem: Nutrition: Goal: Adequate nutrition will be maintained Outcome: Progressing   Problem: Elimination: Goal: Will not experience complications related to bowel motility Outcome: Progressing Goal: Will not experience complications related to urinary retention Outcome: Progressing

## 2019-02-23 NOTE — Progress Notes (Addendum)
Patient called out for pain medication and help getting out of the bed. RN and NT went to patients bedside to help her get out of bed and give her 10mg  Oxycodone. Attempting to help get her up, she yells, "Get the F off me. I am not taking the pain medication. I demand to speak with my doctor. I want this chest tube out. I cannot take this." Paged Dr. Magnus Ivan to notify him, placed him on speaker phone to speak with patient and explained that he would not be removing the chest tube today, the plan is to repeat chest x-ray tomorrow." Patient is yelling over the doctor stating she will not talk to him over the phone.   Patient refused pain medication and Xanax, also refused for a repeat of her BP and allowing [place] to put the continuous pulse ox on her finger.   Patient states she wants a new doctor and Charity fundraiser.   Selena Batten, AC on 6N at the time and went to speak with patient along with Danford Bad, Charity fundraiser (charge).

## 2019-02-23 NOTE — Progress Notes (Signed)
Patient's mother requested to speak with this nurse and stated that the patient told her she was feeling trapped and she was in pain. Went to patient's room to administer pain medication. During that time patient was verbally abusive towards this nurse and the patient's mother. Tried to reassure and comfort the patient to no effect. Patient's mother assisted patient in ambulation around the hall.

## 2019-02-24 ENCOUNTER — Emergency Department (HOSPITAL_COMMUNITY)
Admission: EM | Admit: 2019-02-24 | Discharge: 2019-02-24 | Discharge status: Against Medical Advice | Payer: Medicaid Other | Attending: Emergency Medicine | Admitting: Emergency Medicine

## 2019-02-24 ENCOUNTER — Other Ambulatory Visit: Payer: Self-pay

## 2019-02-24 ENCOUNTER — Inpatient Hospital Stay (HOSPITAL_COMMUNITY): Payer: Self-pay

## 2019-02-24 DIAGNOSIS — R0602 Shortness of breath: Secondary | ICD-10-CM | POA: Insufficient documentation

## 2019-02-24 DIAGNOSIS — Z87891 Personal history of nicotine dependence: Secondary | ICD-10-CM | POA: Insufficient documentation

## 2019-02-24 DIAGNOSIS — R079 Chest pain, unspecified: Secondary | ICD-10-CM | POA: Insufficient documentation

## 2019-02-24 MED ORDER — METHOCARBAMOL 500 MG PO TABS: 500.0000 mg | ORAL_TABLET | Freq: Three times a day (TID) | ORAL | 0 refills | Status: DC | PRN

## 2019-02-24 MED ORDER — FENTANYL CITRATE (PF) 100 MCG/2ML IJ SOLN
INTRAMUSCULAR | Status: AC
  Filled 2019-02-24: qty 2

## 2019-02-24 MED ORDER — OXYCODONE HCL 5 MG PO TABS: 5.0000 mg | ORAL_TABLET | Freq: Four times a day (QID) | ORAL | 0 refills | Status: DC | PRN

## 2019-02-24 MED ORDER — FENTANYL CITRATE (PF) 100 MCG/2ML IJ SOLN
25.0000 ug | Freq: Once | INTRAMUSCULAR | Status: AC
  Administered 2019-02-24: 25 ug via INTRAVENOUS

## 2019-02-24 MED ORDER — ACETAMINOPHEN 325 MG PO TABS: 650.0000 mg | ORAL_TABLET | Freq: Four times a day (QID) | ORAL | Status: DC | PRN

## 2019-02-24 MED ORDER — METHOCARBAMOL 500 MG PO TABS
500.0000 mg | ORAL_TABLET | Freq: Three times a day (TID) | ORAL | Status: DC
  Administered 2019-02-24: 500 mg via ORAL
  Filled 2019-02-24: qty 1

## 2019-02-24 NOTE — ED Notes (Signed)
Pt  Leaving AMA because her ride is here and she "needs to go". Informed pt that she is waiting on CSM or SW consult. Pt states that she will find a safe place to go. Asked pt if this RN could go get CSM or SW. Pt continues to insist upon leaving. Pt ambulatory and pushing her belongings in a WC with no s/sx acute distress noted

## 2019-02-24 NOTE — Progress Notes (Signed)
Spent 20 minutes in patient room. Patient states she does not want this nurse caring for her anymore and would like to be transferred to another floor.This nurse spoke with charge about it and patient spoke with [facility]. Charge nurse Celso taking over care of patient for the remainder of shift.

## 2019-02-24 NOTE — Progress Notes (Signed)
Patient asked for pain medication. This nurse brought in 10 mg Oxycodone for patient. Patient states she wanted her pain medication when she was about to go to bed and that she didn't want the Oxycodone because it did nothing for her.

## 2019-02-24 NOTE — Discharge Instructions (Signed)
Nasal Fracture A fracture is a break in a bone. A nasal fracture is a broken nose. Minor breaks do not need treatment. They often heal on their own in about one month. Serious breaks may need treatment. Sometimes surgery is needed. What are the causes? This condition is usually caused by a direct hit to the nose (blunt injury). This often occurs from:  Playing a contact sport.  Being in a car accident.  Falling.  Getting punched. What are the signs or symptoms?  Pain.  Swelling of the nose.  Bleeding from the nose.  Bruising around the nose or bruising around the eyes (black eyes).  The nose having a crooked shape. How is this treated? Treatment depends on how bad the injury is.  Minor breaks often do not need treatment.  For more serious breaks that have caused bones to move out of position, treatment may involve one of these: ? Moving the bones back into position without surgery. Your doctor may be able to do this in his or her office after you are given medicine to numb the nose area (local anesthetic). ? Surgery. If needed, this will be done after the swelling is gone. Follow these instructions at home: Activity  Return to your normal activities as told by your doctor. Ask your doctor what activities are safe for you.  Do not play contact sports for 3-4 weeks or as told by your doctor. General instructions      If told, put ice on the injured area: ? Put ice in a plastic bag. ? Place a towel between your skin and the bag. ? Leave the ice on for 20 minutes, 2-3 times a day.  Take over-the-counter and prescription medicines only as told by your doctor.  If your nose bleeds, sit up while you gently squeeze your nose shut for 10 minutes.  Try to not blow your nose.  Keep all follow-up visits as told by your doctor. This is important. Contact a doctor if:  You have more pain or very bad pain.  You keep having nosebleeds.  The shape of your nose does not  return to normal after 5 days.  You have pus coming out of your nose. Get help right away if:  Your nose bleeds for more than 20 minutes.  You have clear fluid draining out of your nose.  You have a swelling on the inside of your nose that does not get better.  You have trouble moving your eyes.  You keep throwing up (vomiting). Summary  A nasal fracture is a broken nose.  Symptoms include pain, swelling, and bruising.  Minor breaks often do not require treatment. More serious breaks may require surgery or other treatments.  If your nose bleeds, sit up while you gently squeeze your nose shut for 10 minutes. This information is not intended to replace advice given to you by your health care provider. Make sure you discuss any questions you have with your health care provider. Document Released: 09/19/2008 Document Revised: 05/14/2018 Document Reviewed: 05/14/2018 Elsevier Interactive Patient Education  2019 [employer]vier Inc[employer]   Pneumothorax A pneumothorax is commonly called a collapsed lung. It is a condition in which air leaks from a lung and builds up between the thin layer of tissue that covers the lungs (visceral pleura) and the interior wall of the chest cavity (parietal pleura). The air gets trapped outside the lung, between the lung and the chest wall (pleural space). The air takes up space and prevents the  lung from fully expanding. This condition sometimes occurs suddenly with no apparent cause. The buildup of air may be small or large. A small pneumothorax may go away on its own. A large pneumothorax will require treatment and hospitalization. What are the causes? This condition may be caused by:  Trauma and injury to the chest wall.  Surgery and other medical procedures.  A complication of an underlying lung problem, especially chronic obstructive pulmonary disease (COPD) or emphysema. Sometimes the cause of this condition is not known. What increases the risk? You are  more likely to develop this condition if:  You have an underlying lung problem.  You smoke.  You are 58-17 years old, female, tall, and underweight.  You have a personal or family history of pneumothorax.  You have an eating disorder (anorexia nervosa). This condition can also happen quickly, even in people with no history of lung problems. What are the signs or symptoms? Sometimes a pneumothorax will have no symptoms. When symptoms are present, they can include:  Chest pain.  Shortness of breath.  Increased rate of breathing.  Bluish color to your lips or skin (cyanosis). How is this diagnosed? This condition may be diagnosed by:  A medical history and physical exam.  A chest X-ray, chest CT scan, or ultrasound. How is this treated? Treatment depends on how severe your condition is. The goal of treatment is to remove the extra air and allow your lung to expand back to its normal size.  For a small pneumothorax: ? No treatment may be needed. ? Extra oxygen is sometimes used to make it go away more quickly.  For a large pneumothorax or a pneumothorax that is causing symptoms, a procedure is done to drain the air from your lungs. To do this, a health care provider may use: ? A needle with a syringe. This is used to suck air from a pleural space where no additional leakage is taking place. ? A chest tube. This is used to suck air where there is ongoing leakage into the pleural space. The chest tube may need to remain in place for several days until the air leak has healed.  In more severe cases, surgery may be needed to repair the damage that is causing the leak.  If you have multiple pneumothorax episodes or have an air leak that will not heal, a procedure called a pleurodesis may be done. A medicine is placed in the pleural space to irritate the tissues around the lung so that the lung will stick to the chest wall, seal any leaks, and stop any buildup of air in that space. If  you have an underlying lung problem, severe symptoms, or a large pneumothorax you will usually need to stay in the hospital. Follow these instructions at home: Lifestyle  Do not use any products that contain nicotine or tobacco, such as cigarettes and e-cigarettes. These are major risk factors in pneumothorax. If you need help quitting, ask your health care provider.  Do not lift anything that is heavier than 10 lb (4.5 kg), or the limit that your health care provider tells you, until he or she says that it is safe.  Avoid activities that take a lot of effort (strenuous) for as long as told by your health care provider.  Return to your normal activities as told by your health care provider. Ask your health care provider what activities are safe for you.  Do not fly in an airplane or scuba dive until your  health care provider says it is okay. General instructions  Take over-the-counter and prescription medicines only as told by your health care provider.  If a cough or pain makes it difficult for you to sleep at night, try sleeping in a semi-upright position in a recliner or by using 2 or 3 pillows.  If you had a chest tube and it was removed, ask your health care provider when you can remove the bandage (dressing). While the dressing is in place, do not allow it to get wet.  Keep all follow-up visits as told by your health care provider. This is important. Contact a health care provider if:  You cough up thick mucus (sputum) that is yellow or green in color.  You were treated with a chest tube, and you have redness, increasing pain, or discharge at the site where it was placed. Get help right away if:  You have increasing chest pain or shortness of breath.  You have a cough that will not go away.  You begin coughing up blood.  You have pain that is getting worse or is not controlled with medicines.  The site where your chest tube was located opens up.  You feel air coming out  of the site where the chest tube was placed.  You have a fever or persistent symptoms for more than 2-3 days.  You have a fever and your symptoms suddenly get worse. These symptoms may represent a serious problem that is an emergency. Do not wait to see if the symptoms will go away. Get medical help right away. Call your local emergency services (911 in the [place] Do not drive yourself to the hospital. Summary  A pneumothorax, commonly called a collapsed lung, is a condition in which air leaks from a lung and gets trapped between the lung and the chest wall (pleural space).  The buildup of air may be small or large. A small pneumothorax may go away on its own. A large pneumothorax will require treatment and hospitalization.  Treatment for this condition depends on how severe the pneumothorax is. The goal of treatment is to remove the extra air and allow the lung to expand back to its normal size. This information is not intended to replace advice given to you by your health care provider. Make sure you discuss any questions you have with your health care provider. Document Released: 12/11/2005 Document Revised: 11/19/2017 Document Reviewed: 11/19/2017 Elsevier Interactive Patient Education  2019 [employer].
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
## 2019-02-24 NOTE — Progress Notes (Addendum)
Pt has been very moody this shift. Refusing labs, not complying with nurses, doctors, staff. Meds were given to pt by pharmacy at discharge. Extra supplies were also given to pt.Marland Kitchen

## 2019-02-24 NOTE — ED Triage Notes (Signed)
Per pt, was DC'd from hospital today after having chest tube removed from left side. Pt having 10/10 back and rib pain. Also states " it feels tight to breathe."

## 2019-02-24 NOTE — Progress Notes (Addendum)
CSW met with patient at beside. Patient was not receptive to speaking with the social worker but did accept the resources. CSW left the resources at bedside. CSW informed the nurse that if she has any other needs to page the social worker.   CSW called and left a message for the patients mother to see if the patient has followed up with [facility].   Patient is expected to discharge this afternoon.   CSW provided resources.   CSW could not complete SBIRT due to patient refusing to speak with CSW.   Domenic Schwab, MSW, [place]

## 2019-02-24 NOTE — Progress Notes (Signed)
BP 87/59 HR 54 Paged Dr Sheliah Hatch on call and made aware
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
## 2019-02-25 MED FILL — Hydromorphone HCl Inj 2 MG/ML: INTRAMUSCULAR | Qty: 2 | Status: AC

## 2019-03-14 ENCOUNTER — Emergency Department (HOSPITAL_COMMUNITY)
Admission: EM | Admit: 2019-03-14 | Discharge: 2019-03-15 | Disposition: A | Discharge status: Other Acute Inpatient Hospital | Payer: Self-pay | Attending: Emergency Medicine | Admitting: Emergency Medicine

## 2019-03-14 ENCOUNTER — Emergency Department (HOSPITAL_COMMUNITY): Payer: Self-pay

## 2019-03-14 ENCOUNTER — Other Ambulatory Visit: Payer: Self-pay

## 2019-03-14 ENCOUNTER — Encounter (HOSPITAL_COMMUNITY): Payer: Self-pay | Admitting: *Deleted

## 2019-03-14 DIAGNOSIS — F10921 Alcohol use, unspecified with intoxication delirium: Secondary | ICD-10-CM

## 2019-03-14 DIAGNOSIS — Y939 Activity, unspecified: Secondary | ICD-10-CM | POA: Insufficient documentation

## 2019-03-14 DIAGNOSIS — F250 Schizoaffective disorder, bipolar type: Secondary | ICD-10-CM | POA: Insufficient documentation

## 2019-03-14 DIAGNOSIS — R451 Restlessness and agitation: Secondary | ICD-10-CM

## 2019-03-14 DIAGNOSIS — F1410 Cocaine abuse, uncomplicated: Secondary | ICD-10-CM

## 2019-03-14 DIAGNOSIS — Z87891 Personal history of nicotine dependence: Secondary | ICD-10-CM | POA: Insufficient documentation

## 2019-03-14 DIAGNOSIS — R4182 Altered mental status, unspecified: Secondary | ICD-10-CM | POA: Insufficient documentation

## 2019-03-14 DIAGNOSIS — T50902A Poisoning by unspecified drugs, medicaments and biological substances, intentional self-harm, initial encounter: Secondary | ICD-10-CM | POA: Diagnosis present

## 2019-03-14 DIAGNOSIS — Y929 Unspecified place or not applicable: Secondary | ICD-10-CM | POA: Insufficient documentation

## 2019-03-14 DIAGNOSIS — F1920 Other psychoactive substance dependence, uncomplicated: Secondary | ICD-10-CM | POA: Diagnosis present

## 2019-03-14 DIAGNOSIS — F1210 Cannabis abuse, uncomplicated: Secondary | ICD-10-CM

## 2019-03-14 DIAGNOSIS — Z79899 Other long term (current) drug therapy: Secondary | ICD-10-CM | POA: Insufficient documentation

## 2019-03-14 LAB — CBC WITH DIFFERENTIAL/PLATELET
Abs Immature Granulocytes: 0.03 10*3/uL (ref 0.00–0.07)
Basophils Absolute: 0 10*3/uL (ref 0.0–0.1)
Basophils Relative: 0 %
Eosinophils Absolute: 0.1 10*3/uL (ref 0.0–0.5)
Eosinophils Relative: 1 %
HCT: 40 % (ref 36.0–46.0)
Hemoglobin: 13.2 g/dL (ref 12.0–15.0)
Immature Granulocytes: 0 %
Lymphocytes Relative: 28 %
Lymphs Abs: 2.7 10*3/uL (ref 0.7–4.0)
MCH: 30.8 pg (ref 26.0–34.0)
MCHC: 33 g/dL (ref 30.0–36.0)
MCV: 93.5 fL (ref 80.0–100.0)
Monocytes Absolute: 0.8 10*3/uL (ref 0.1–1.0)
Monocytes Relative: 8 %
Neutro Abs: 5.9 10*3/uL (ref 1.7–7.7)
Neutrophils Relative %: 63 %
Platelets: 300 10*3/uL (ref 150–400)
RBC: 4.28 MIL/uL (ref 3.87–5.11)
RDW: 15.8 % — ABNORMAL HIGH (ref 11.5–15.5)
WBC: 9.4 10*3/uL (ref 4.0–10.5)
nRBC: 0 % (ref 0.0–0.2)

## 2019-03-14 LAB — COMPREHENSIVE METABOLIC PANEL
ALK PHOS: 75 U/L (ref 38–126)
ALT: 18 U/L (ref 0–44)
AST: 23 U/L (ref 15–41)
Albumin: 4.8 g/dL (ref 3.5–5.0)
Anion gap: 14 (ref 5–15)
BUN: 9 mg/dL (ref 6–20)
CO2: 18 mmol/L — ABNORMAL LOW (ref 22–32)
Calcium: 9.4 mg/dL (ref 8.9–10.3)
Chloride: 107 mmol/L (ref 98–111)
Creatinine, Ser: 0.73 mg/dL (ref 0.44–1.00)
GFR calc Af Amer: >60 mL/min (ref >60)
GFR calc non Af Amer: >60 mL/min (ref >60)
Glucose, Bld: 94 mg/dL (ref 70–99)
Potassium: 3.4 mmol/L — ABNORMAL LOW (ref 3.5–5.1)
Sodium: 139 mmol/L (ref 135–145)
Total Bilirubin: 0.4 mg/dL (ref 0.3–1.2)
Total Protein: 8.4 g/dL — ABNORMAL HIGH (ref 6.5–8.1)

## 2019-03-14 LAB — I-STAT BETA HCG BLOOD, ED (MC, WL, AP ONLY): I-stat hCG, quantitative: <5 m[IU]/mL (ref <5)

## 2019-03-14 MED ORDER — ZIPRASIDONE MESYLATE 20 MG IM SOLR
10.0000 mg | Freq: Once | INTRAMUSCULAR | Status: AC
  Administered 2019-03-14: 10 mg via INTRAMUSCULAR
  Filled 2019-03-14: qty 20

## 2019-03-14 MED ORDER — LORAZEPAM 2 MG/ML IJ SOLN
2.0000 mg | Freq: Once | INTRAMUSCULAR | Status: AC
  Administered 2019-03-14: 2 mg via INTRAMUSCULAR
  Filled 2019-03-14: qty 1

## 2019-03-14 MED ORDER — STERILE WATER FOR INJECTION IJ SOLN
INTRAMUSCULAR | Status: AC
  Administered 2019-03-14: 23:00:00
  Filled 2019-03-14: qty 10
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
## 2019-03-15 ENCOUNTER — Emergency Department (HOSPITAL_COMMUNITY): Payer: Self-pay

## 2019-03-15 ENCOUNTER — Inpatient Hospital Stay (HOSPITAL_COMMUNITY)
Admission: AD | Admit: 2019-03-15 | Discharge: 2019-03-17 | DRG: 885 | Disposition: A | Discharge status: Home / Self Care | Payer: No Typology Code available for payment source | Source: Intra-hospital | Attending: Psychiatry | Admitting: Psychiatry

## 2019-03-15 DIAGNOSIS — F10129 Alcohol abuse with intoxication, unspecified: Secondary | ICD-10-CM | POA: Diagnosis present

## 2019-03-15 DIAGNOSIS — F250 Schizoaffective disorder, bipolar type: Secondary | ICD-10-CM | POA: Diagnosis present

## 2019-03-15 DIAGNOSIS — Z590 Homelessness: Secondary | ICD-10-CM

## 2019-03-15 DIAGNOSIS — Z560 Unemployment, unspecified: Secondary | ICD-10-CM | POA: Diagnosis not present

## 2019-03-15 DIAGNOSIS — Z79899 Other long term (current) drug therapy: Secondary | ICD-10-CM

## 2019-03-15 DIAGNOSIS — Z87891 Personal history of nicotine dependence: Secondary | ICD-10-CM | POA: Diagnosis not present

## 2019-03-15 DIAGNOSIS — F1994 Other psychoactive substance use, unspecified with psychoactive substance-induced mood disorder: Secondary | ICD-10-CM | POA: Diagnosis present

## 2019-03-15 DIAGNOSIS — Z62810 Personal history of physical and sexual abuse in childhood: Secondary | ICD-10-CM | POA: Diagnosis present

## 2019-03-15 DIAGNOSIS — Z791 Long term (current) use of non-steroidal anti-inflammatories (NSAID): Secondary | ICD-10-CM

## 2019-03-15 DIAGNOSIS — Z833 Family history of diabetes mellitus: Secondary | ICD-10-CM | POA: Diagnosis not present

## 2019-03-15 LAB — RAPID URINE DRUG SCREEN, HOSP PERFORMED
Amphetamines: NOT DETECTED
Barbiturates: NOT DETECTED
Benzodiazepines: NOT DETECTED
Cocaine: POSITIVE — AB
Opiates: NOT DETECTED
Tetrahydrocannabinol: POSITIVE — AB

## 2019-03-15 LAB — ETHANOL: Alcohol, Ethyl (B): 280 mg/dL — ABNORMAL HIGH (ref <10)

## 2019-03-15 LAB — SALICYLATE LEVEL

## 2019-03-15 LAB — ACETAMINOPHEN LEVEL: Acetaminophen (Tylenol), Serum: <10 ug/mL — ABNORMAL LOW (ref 10–30)

## 2019-03-15 MED ORDER — ZIPRASIDONE MESYLATE 20 MG IM SOLR: 10.0000 mg | Freq: Once | INTRAMUSCULAR | Status: DC

## 2019-03-15 MED ORDER — OLANZAPINE 5 MG PO TBDP
5.0000 mg | ORAL_TABLET | Freq: Two times a day (BID) | ORAL | Status: DC
  Administered 2019-03-15: 5 mg via ORAL
  Filled 2019-03-15: qty 1

## 2019-03-15 NOTE — [facility] Counselor (Addendum)
Clinician attempted to speak to pt's nurse to see if the pt was roused, alert and able to engage in the assessment. Clinician was placed on hold for ten minutes. Clinician to call back.    Redmond Pulling, MS, [facility], [facility] Triage Specialist [phone]

## 2019-03-15 NOTE — ED Notes (Signed)
Pt A&O x 3, no distress noted, calm & cooperative, sleeping at present,  Monitoring for safety, Q 15 min checks in effect.  Pending report to [facility] and [employer] transport.

## 2019-03-15 NOTE — ED Notes (Signed)
Bed: XYB33 Expected date:  Expected time:  Means of arrival:  Comments: Hold for 15

## 2019-03-15 NOTE — BH Assessment (Signed)
[facility] Assessment Progress Note  Case was staffed with Jannifer Franklin MD, Cresenciano Genre who recommended a inpatient admission to assist with stabilization.
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
## 2019-03-15 NOTE — ED Notes (Signed)
Called Berneice Heinrich RN, [facility], at [facility] and she said that pt has a bed assignment, 306-1 and will be able to go sometime after 7 pm.
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
## 2019-03-15 NOTE — ED Notes (Signed)
Bed: [facility] Expected date:  Expected time:  Means of arrival:  Comments: 

## 2019-03-15 NOTE — [facility] Counselor (Addendum)
Clinician spoke to the pt's nurse Verlon Au) and noted the pt is still sleeping. Clinician asked Verlon Au, RN to call [phone] once the pt is roused, alert and able to engage in the assessment.   Per chart, pt was administered a Geodon injection, 10 mg at 0030.  Redmond Pulling, MS, [facility], [facility] Triage Specialist [phone]

## 2019-03-15 NOTE — ED Notes (Signed)
Safety Sitter at bedside 

## 2019-03-15 NOTE — ED Notes (Signed)
Report called to RN [place], [facility].  Pending call back from [facility] or RN in reference to accepting pt.

## 2019-03-15 NOTE — ED Notes (Signed)
Pt talking on phone at present.

## 2019-03-15 NOTE — ED Notes (Signed)
Pt accepted to [facility], pending transport at 12 midnight via [facility] escort.

## 2019-03-16 ENCOUNTER — Encounter (HOSPITAL_COMMUNITY): Payer: Self-pay

## 2019-03-16 ENCOUNTER — Other Ambulatory Visit: Payer: Self-pay

## 2019-03-16 DIAGNOSIS — F1994 Other psychoactive substance use, unspecified with psychoactive substance-induced mood disorder: Secondary | ICD-10-CM

## 2019-03-16 MED ORDER — NICOTINE 21 MG/24HR TD PT24
21.0000 mg | MEDICATED_PATCH | Freq: Every day | TRANSDERMAL | Status: DC
  Administered 2019-03-16: 21 mg via TRANSDERMAL
  Filled 2019-03-16 (×5): qty 1

## 2019-03-16 MED ORDER — MAGNESIUM HYDROXIDE 400 MG/5ML PO SUSP: 30.0000 mL | Freq: Every day | ORAL | Status: DC | PRN

## 2019-03-16 MED ORDER — OLANZAPINE 5 MG PO TBDP
5.0000 mg | ORAL_TABLET | Freq: Every day | ORAL | Status: DC
  Filled 2019-03-16 (×3): qty 1

## 2019-03-16 MED ORDER — ONDANSETRON 4 MG PO TBDP: 4.0000 mg | ORAL_TABLET | Freq: Four times a day (QID) | ORAL | Status: DC | PRN

## 2019-03-16 MED ORDER — OLANZAPINE 5 MG PO TBDP
5.0000 mg | ORAL_TABLET | Freq: Two times a day (BID) | ORAL | Status: DC
  Filled 2019-03-16 (×6): qty 1

## 2019-03-16 MED ORDER — TRAZODONE HCL 50 MG PO TABS
50.0000 mg | ORAL_TABLET | Freq: Every evening | ORAL | Status: DC | PRN
  Administered 2019-03-16: 50 mg via ORAL
  Filled 2019-03-16: qty 1

## 2019-03-16 MED ORDER — ALUM & MAG HYDROXIDE-SIMETH 200-200-20 MG/5ML PO SUSP: 30.0000 mL | ORAL | Status: DC | PRN

## 2019-03-16 MED ORDER — OLANZAPINE 5 MG PO TBDP
5.0000 mg | ORAL_TABLET | Freq: Two times a day (BID) | ORAL | Status: DC
  Filled 2019-03-16: qty 1

## 2019-03-16 MED ORDER — HYDROXYZINE HCL 25 MG PO TABS: 25.0000 mg | ORAL_TABLET | Freq: Four times a day (QID) | ORAL | Status: DC | PRN

## 2019-03-16 MED ORDER — VITAMIN B-1 100 MG PO TABS
100.0000 mg | ORAL_TABLET | Freq: Every day | ORAL | Status: DC
  Filled 2019-03-16 (×3): qty 1

## 2019-03-16 MED ORDER — ACETAMINOPHEN 325 MG PO TABS
650.0000 mg | ORAL_TABLET | Freq: Four times a day (QID) | ORAL | Status: DC | PRN
  Administered 2019-03-16 – 2019-03-17 (×3): 650 mg via ORAL
  Filled 2019-03-16 (×3): qty 2

## 2019-03-16 MED ORDER — LORAZEPAM 1 MG PO TABS
1.0000 mg | ORAL_TABLET | Freq: Four times a day (QID) | ORAL | Status: DC | PRN
  Filled 2019-03-16: qty 1

## 2019-03-16 MED ORDER — TRAZODONE HCL 50 MG PO TABS
50.0000 mg | ORAL_TABLET | Freq: Every evening | ORAL | Status: DC | PRN
  Administered 2019-03-17: 50 mg via ORAL
  Filled 2019-03-16: qty 1

## 2019-03-16 MED ORDER — IBUPROFEN 600 MG PO TABS
600.0000 mg | ORAL_TABLET | Freq: Once | ORAL | Status: AC
  Administered 2019-03-17: 600 mg via ORAL
  Filled 2019-03-16 (×2): qty 1

## 2019-03-16 MED ORDER — LOPERAMIDE HCL 2 MG PO CAPS: 2.0000 mg | ORAL_CAPSULE | ORAL | Status: DC | PRN

## 2019-03-16 MED ORDER — ADULT MULTIVITAMIN W/MINERALS CH
1.0000 | ORAL_TABLET | Freq: Every day | ORAL | Status: DC
  Filled 2019-03-16 (×4): qty 1
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
## 2019-03-16 NOTE — Progress Notes (Signed)
Report received from admitting RN.  Introduced self to pt.  Pt denies SI/HI, denies hallucinations, and endorses left-sided abdomen pain of 4/10.  She requests medication for sleep and reports she has taken Trazodone in the past.  Denies allergies.  Pt was provided with snacks and PO fluids.  PRN medication administered for sleep and pain.  Pt denies further needs and concerns.  She verbally contracts for safety and reports she will inform staff of needs and concerns.  Will continue to monitor and assess.
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
## 2019-03-16 NOTE — Tx Team (Signed)
Initial Treatment Plan 03/16/2019 4:17 AM Nechama Guard MPN:[id number]    PATIENT STRESSORS: Financial difficulties Marital or family conflict Substance abuse   PATIENT STRENGTHS: Physical Health Supportive family/friends   PATIENT IDENTIFIED PROBLEMS: Substance abuse  Depression  "safety"                 DISCHARGE CRITERIA:  Ability to meet basic life and health needs Adequate post-discharge living arrangements Improved stabilization in mood, thinking, and/or behavior Medical problems require only outpatient monitoring  PRELIMINARY DISCHARGE PLAN: Attend aftercare/continuing care group Attend PHP/IOP Outpatient therapy Placement in alternative living arrangements  PATIENT/FAMILY INVOLVEMENT: This treatment plan has been presented to and reviewed with the patient, ARIS STRELOW, and/or family member.  The patient and family have been given the opportunity to ask questions and make suggestions.  Bethann Punches, RN 03/16/2019, 4:17 AM

## 2019-03-16 NOTE — Progress Notes (Signed)
Pt presents with a labile mood. Pt noted to be easily agitated and resistant to treatment. Pt appears to be withdrawn and is isolative to her room. Pt refuses to take scheduled medications. Pt refuses to attend scheduled groups. Pt guarded and forwards little information. Pt denies AVH. Pt denies SI/HI. Pt denies any substance abuse withdrawal symptoms.  Medications reviewed with pt. Verbal support provided. Pt encouraged to take scheduled meds. Pt encouraged to attend groups. 15 minute checks performed for safety.   Pt non-receptive to tx plan.
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
## 2019-03-17 DIAGNOSIS — F250 Schizoaffective disorder, bipolar type: Principal | ICD-10-CM

## 2019-03-17 MED ORDER — QUETIAPINE FUMARATE 100 MG PO TABS
100.0000 mg | ORAL_TABLET | Freq: Every day | ORAL | Status: DC
  Filled 2019-03-17 (×2): qty 1

## 2019-03-17 MED ORDER — LORAZEPAM 1 MG PO TABS
1.0000 mg | ORAL_TABLET | Freq: Once | ORAL | Status: AC
  Administered 2019-03-17: 1 mg via ORAL

## 2019-03-17 MED ORDER — OLANZAPINE 5 MG PO TBDP
5.0000 mg | ORAL_TABLET | Freq: Once | ORAL | Status: AC
  Administered 2019-03-17: 5 mg via ORAL
  Filled 2019-03-17: qty 1

## 2019-03-17 MED ORDER — FLUOXETINE HCL 20 MG PO CAPS
20.0000 mg | ORAL_CAPSULE | Freq: Every day | ORAL | Status: DC
  Filled 2019-03-17 (×4): qty 1

## 2019-03-17 MED ORDER — PRENATAL MULTIVITAMIN CH
1.0000 | ORAL_TABLET | Freq: Every day | ORAL | Status: DC
  Filled 2019-03-17 (×4): qty 1

## 2019-03-17 NOTE — Progress Notes (Signed)
Patient irritable and upset, wants to sign 72 hr request for discharge but patient is involuntary.  Patient talked to MD who said to discharge patient. Patient was given paper work for discharge.  Belongings given to patient out of her locker. Two nurses and security guard walked patient to lobby with discharge paperwork.

## 2019-03-17 NOTE — Plan of Care (Signed)
  Problem: Self-Concept: Goal: Level of anxiety will decrease Outcome: Not Progressing Note:  Pt has been increasingly anxious tonight and she attributes this to not being able to fall asleep.  She required one time PO medications to help with her anxiety and agitation.

## 2019-03-17 NOTE — Progress Notes (Signed)
Patient stated she has not seen MD today.  That she did talk to SW this afternoon.  That she saw MD yesterday.  Patient wants to be discharged.  Patient is involuntary and wants to sign a 72 hr request for discharge.   Patient talked to MD who stated he will discharge patient now.  Patient does not want medications or any therapy. Patient upset and wants discharge ASAP.
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
## 2019-03-17 NOTE — Tx Team (Signed)
Interdisciplinary Treatment and Diagnostic Plan Update  03/17/2019 Time of Session:  Stacy Peterson MRN: [id number]  Principal Diagnosis: Schizoaffective disorder, bipolar type [facility])  Secondary Diagnoses: Principal Problem:   Schizoaffective disorder, bipolar type ([place]) Active Problems:   Substance induced mood disorder ([place])   Current Medications:  Current Facility-Administered Medications  Medication Dose Route Frequency Provider Last Rate Last Dose  . acetaminophen (TYLENOL) tablet 650 mg  650 mg Oral Q6H PRN Patrecia Pour, NP   650 mg at 03/16/19 2053  . alum & mag hydroxide-simeth (MAALOX/MYLANTA) 200-200-20 MG/5ML suspension 30 mL  30 mL Oral Q4H PRN Patrecia Pour, NP      . hydrOXYzine (ATARAX/VISTARIL) tablet 25 mg  25 mg Oral Q6H PRN Cobos, Myer Peer, MD      . LORazepam (ATIVAN) tablet 1 mg  1 mg Oral Q6H PRN Cobos, Myer Peer, MD      . magnesium hydroxide (MILK OF MAGNESIA) suspension 30 mL  30 mL Oral Daily PRN Patrecia Pour, NP      . multivitamin with minerals tablet 1 tablet  1 tablet Oral Daily Cobos, Fernando A, MD      . nicotine (NICODERM CQ - dosed in mg/24 hours) patch 21 mg  21 mg Transdermal Daily Cobos, Myer Peer, MD   21 mg at 03/16/19 1721  . OLANZapine zydis (ZYPREXA) disintegrating tablet 5 mg  5 mg Oral QHS Cobos, Fernando A, MD      . ondansetron (ZOFRAN-ODT) disintegrating tablet 4 mg  4 mg Oral Q6H PRN Cobos, Myer Peer, MD      . thiamine (VITAMIN B-1) tablet 100 mg  100 mg Oral Daily Cobos, Fernando A, MD      . traZODone (DESYREL) tablet 50 mg  50 mg Oral QHS PRN Cobos, Myer Peer, MD   50 mg at 03/17/19 0010   PTA Medications: Medications Prior to Admission  Medication Sig Dispense Refill Last Dose  . acetaminophen (TYLENOL) 325 MG tablet Take 2 tablets (650 mg total) by mouth every 6 (six) hours as needed for mild pain, moderate pain or fever.     . benzocaine-Menthol (DERMOPLAST) 20-0.5 % AERO Apply 1 application topically as needed for  irritation (perineal discomfort). (Patient not taking: Reported on 02/21/2019) 1 each 1 Not Taking at Unknown time  . ibuprofen (ADVIL,MOTRIN) 600 MG tablet Take 1 tablet (600 mg total) by mouth every 6 (six) hours. (Patient not taking: Reported on 02/21/2019) 30 tablet 0 Not Taking at Unknown time  . methocarbamol (ROBAXIN) 500 MG tablet Take 1 tablet (500 mg total) by mouth every 8 (eight) hours as needed for muscle spasms. 30 tablet 0   . Prenatal Vit-Fe Fumarate-FA (PRENATAL MULTIVITAMIN) TABS tablet Take 1 tablet by mouth daily at 12 noon. (Patient not taking: Reported on 02/21/2019) 30 tablet 11 Not Taking at Unknown time    Patient Stressors: Financial difficulties Marital or family conflict Substance abuse  Patient Strengths: Physical Health Supportive family/friends  Treatment Modalities: Medication Management, Group therapy, Case management,  1 to 1 session with clinician, Psychoeducation, Recreational therapy.   Physician Treatment Plan for Primary Diagnosis: Schizoaffective disorder, bipolar type ([place]) Long Term Goal(s): Improvement in symptoms so as ready for discharge Improvement in symptoms so as ready for discharge   Short Term Goals: Ability to identify changes in lifestyle to reduce recurrence of condition will improve Ability to verbalize feelings will improve Ability to disclose and discuss suicidal ideas Ability to demonstrate self-control will improve Ability to  identify and develop effective coping behaviors will improve Ability to maintain clinical measurements within normal limits will improve Compliance with prescribed medications will improve Ability to identify triggers associated with substance abuse/mental health issues will improve  Medication Management: Evaluate patient's response, side effects, and tolerance of medication regimen.  Therapeutic Interventions: 1 to 1 sessions, Unit Group sessions and Medication administration.  Evaluation of Outcomes:  Not Met  Physician Treatment Plan for Secondary Diagnosis: Principal Problem:   Schizoaffective disorder, bipolar type ([place]) Active Problems:   Substance induced mood disorder ([place])  Long Term Goal(s): Improvement in symptoms so as ready for discharge Improvement in symptoms so as ready for discharge   Short Term Goals: Ability to identify changes in lifestyle to reduce recurrence of condition will improve Ability to verbalize feelings will improve Ability to disclose and discuss suicidal ideas Ability to demonstrate self-control will improve Ability to identify and develop effective coping behaviors will improve Ability to maintain clinical measurements within normal limits will improve Compliance with prescribed medications will improve Ability to identify triggers associated with substance abuse/mental health issues will improve     Medication Management: Evaluate patient's response, side effects, and tolerance of medication regimen.  Therapeutic Interventions: 1 to 1 sessions, Unit Group sessions and Medication administration.  Evaluation of Outcomes: Not Met   RN Treatment Plan for Primary Diagnosis: Schizoaffective disorder, bipolar type ([place]) Long Term Goal(s): Knowledge of disease and therapeutic regimen to maintain health will improve  Short Term Goals: Ability to participate in decision making will improve, Ability to verbalize feelings will improve, Ability to disclose and discuss suicidal ideas, Ability to identify and develop effective coping behaviors will improve and Compliance with prescribed medications will improve  Medication Management: RN will administer medications as ordered by provider, will assess and evaluate patient's response and provide education to patient for prescribed medication. RN will report any adverse and/or side effects to prescribing provider.  Therapeutic Interventions: 1 on 1 counseling sessions, Psychoeducation, Medication administration,  Evaluate responses to treatment, Monitor vital signs and CBGs as ordered, Perform/monitor CIWA, COWS, AIMS and Fall Risk screenings as ordered, Perform wound care treatments as ordered.  Evaluation of Outcomes: Not Met   LCSW Treatment Plan for Primary Diagnosis: Schizoaffective disorder, bipolar type ([place]) Long Term Goal(s): Safe transition to appropriate next level of care at discharge, Engage patient in therapeutic group addressing interpersonal concerns.  Short Term Goals: Engage patient in aftercare planning with referrals and resources  Therapeutic Interventions: Assess for all discharge needs, 1 to 1 time with Social worker, Explore available resources and support systems, Assess for adequacy in community support network, Educate family and significant other(s) on suicide prevention, Complete Psychosocial Assessment, Interpersonal group therapy.  Evaluation of Outcomes: Not Met   Progress in Treatment: Attending groups: No. Participating in groups: No. Taking medication as prescribed: Yes. Toleration medication: Yes. Family/Significant other contact made: No, will contact:  if patient consents to collateral contacts Patient understands diagnosis: Yes. Discussing patient identified problems/goals with staff: Yes. Medical problems stabilized or resolved: Yes. Denies suicidal/homicidal ideation: Yes. Issues/concerns per patient self-inventory: No. Other:   New problem(s) identified:  New Short Term/Long Term Goal(s):Detox, medication stabilization, elimination of SI thoughts, development of comprehensive mental wellness plan.    Patient Goals:  Help with depression and substance abuse   Discharge Plan or Barriers: CSW will continue to assess for appropriate referrals and possible discharge planning.   Reason for Continuation of Hospitalization: Anxiety Depression Medication stabilization Suicidal ideation  Estimated Length of  Stay: 03/21/2019  Attendees: Patient:  03/17/2019 10:21 AM  Physician: Dr. Johnn Hai. MD 03/17/2019 10:21 AM  Nursing: Rise Paganini.Raliegh Ip, RN 03/17/2019 10:21 AM  RN Care Manager: 03/17/2019 10:21 AM  Social Worker: Radonna Ricker, [place] 03/17/2019 10:21 AM  Recreational Therapist:  03/17/2019 10:21 AM  Other:  03/17/2019 10:21 AM  Other:  03/17/2019 10:21 AM  Other: 03/17/2019 10:21 AM    Scribe for Treatment Team: Marylee Floras, [place] 03/17/2019 10:21 AM
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
## 2019-03-17 NOTE — [facility] Suicide Risk Assessment (Signed)
[facility] INPATIENT:  Family/Significant Other Suicide Prevention Education  Suicide Prevention Education:  Patient Refusal for Family/Significant Other Suicide Prevention Education: The patient Stacy Peterson has refused to provide written consent for family/significant other to be provided Family/Significant Other Suicide Prevention Education during admission and/or prior to discharge.  Physician notified.  Lorri Frederick, LCSW 03/17/2019, 1:07 PM
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
## 2019-03-17 NOTE — BHH Suicide Risk Assessment (Signed)
[facility] Discharge Suicide Risk Assessment   Principal Problem: Schizoaffective disorder, bipolar type (HCC) Discharge Diagnoses: Principal Problem:   Schizoaffective disorder, bipolar type (HCC) Active Problems:   Substance induced mood disorder (HCC)   Total Time spent with patient: 45 minutes  Demanding discharge alert oriented irritable no thoughts of harming self or others contracting fully no psychosis no mania no cravings tremors or withdrawal reported or discerned Mental Status Per Nursing Assessment::   On Admission:  NA  Demographic Factors:  Unemployed  Loss Factors: NA  Historical Factors: Impulsivity  Risk Reduction Factors:   Religious beliefs about death  Continued Clinical Symptoms:  Alcohol/Substance Abuse/Dependencies  Cognitive Features That Contribute To Risk:  Polarized thinking    Suicide Risk:  Minimal: No identifiable suicidal ideation.  Patients presenting with no risk factors but with morbid ruminations; may be classified as minimal risk based on the severity of the depressive symptoms    Plan Of Care/Follow-up recommendations:  Activity:  full  Stacy Gaughran, MD 03/17/2019, 3:57 PM

## 2019-03-17 NOTE — Plan of Care (Signed)
Nurse discussed medications, depression, coping skills with patient.

## 2019-03-17 NOTE — Progress Notes (Signed)
Patient continued to lay in bed all morning.  Patient did sit up in bed to talk to SW.  Patient refused all of her medications.  Stated medications do not help her.  Patient did deny SI and HI.  Denied A/V hallucinations. Emotional support and encouragement given patient. Safety maintained with 15 minute checks.

## 2019-03-17 NOTE — Progress Notes (Signed)
D: Pt was in dayroom talking to peer upon initial approach.  Pt presents with depressed affect and mood.  She states "I had a good day."  Her goal was to "socialize and stay positive" and pt met goal.  Pt denies SI/HI, denies hallucinations, reports generalized pain of 4/10.  Pt has been visible in milieu interacting with peers and staff appropriately.  Pt attended evening group.  Pt requested medication education printouts on Zyprexa and Trazodone and these were provided to her.   A: Introduced self to pt.  Met with pt 1:1.  Actively listened to pt and offered support and encouragement.  PRN medication administered for sleep and pain.  Q15 minute safety checks maintained.  R: Pt is safe on the unit.  Pt is compliant with medications.  Pt verbally contracts for safety.  Will continue to monitor and assess.      Pt was having difficulty falling asleep.  Writer met with pt and she reports racing thoughts, worrying, "it's like everyone else is asleep and I feel out of place.  I can't fall asleep and it's pissing me off."  Pt also verbalized frustration with being in a new place and not having all of her belongings with her.  On-site provider notified and zyprexa zydis 5 mg POX1 and Ativan 1 mg POX1 was ordered and administered.  Encouraged pt to come to staff if she needs anything else and she agrees to do so.  She denies further needs at this time and states "thank you."  Will continue to monitor and assess.

## 2019-03-18 NOTE — Discharge Summary (Signed)
Physician Discharge Summary Note  Patient:  Stacy Peterson is an 28 y.o., female MRN:  [id number] DOB:  01/30/1991 Patient phone:  [phone] (home)  Patient address:   [address] [place] [address],  Total Time spent with patient: 45 minutes  Date of Admission:  03/15/2019 Date of Discharge: 03/17/19  Reason for Admission: Alcohol intoxication violence/substance abuse and depression  Principal Problem: Schizoaffective disorder, bipolar type [facility]) Discharge Diagnoses: Principal Problem:   Schizoaffective disorder, bipolar type ([place]) Active Problems:   Substance induced mood disorder ([place])  Past Medical History:  Past Medical History:  Diagnosis Date  . Bipolar 1 disorder ([place])   . Chlamydia   . Depression   . Preterm labor   . Schizophrenia ([place])   . Shingles   . Trichomonas vaginitis     Past Surgical History:  Procedure Laterality Date  . KNEE SURGERY     Family History:  Family History  Problem Relation Age of Onset  . Diabetes Paternal Grandmother   . Diabetes Paternal Grandfather     Social History:  Social History   Substance and Sexual Activity  Alcohol Use No  . Alcohol/week: 12.0 standard drinks  . Types: 12 Cans of beer per week     Social History   Substance and Sexual Activity  Drug Use Yes  . Types: Marijuana   Comment: marijuana inearly pregnancy    Social History   Socioeconomic History  . Marital status: Single    Spouse name: Not on file  . Number of children: Not on file  . Years of education: Not on file  . Highest education level: Not on file  Occupational History  . Not on file  Social Needs  . Financial resource strain: Not on file  . Food insecurity:    Worry: Not on file    Inability: Not on file  . Transportation needs:    Medical: Not on file    Non-medical: Not on file  Tobacco Use  . Smoking status: Former Smoker    Packs/day: 0.25    Types: Cigarettes  . Smokeless tobacco: Never Used  Substance and  Sexual Activity  . Alcohol use: No    Alcohol/week: 12.0 standard drinks    Types: 12 Cans of beer per week  . Drug use: Yes    Types: Marijuana    Comment: marijuana inearly pregnancy  . Sexual activity: Not Currently    Birth control/protection: None  Lifestyle  . Physical activity:    Days per week: Not on file    Minutes per session: Not on file  . Stress: Not on file  Relationships  . Social connections:    Talks on phone: Not on file    Gets together: Not on file    Attends religious service: Not on file    Active member of club or organization: Not on file    Attends meetings of clubs or organizations: Not on file    Relationship status: Not on file  Other Topics Concern  . Not on file  Social History Narrative  . Not on file    Hospital Course:    Once here the patient was monitored for withdrawal symptoms by the morning of the 23rd I met with her on rounds we discussed the treatment plan but she claimed later in the morning that she had no recall of these events that she was still under the influence of her sleeping medication and she was demanding discharge irritable with  the staff insisting she had no thoughts of harming self or others insisting she had no cravings tremors or withdrawal symptoms, and demanding discharge.  We felt it was not worth fighting with her just to keep her here since she did not need detox and she again denied suicidal thoughts homicidal thoughts denied withdrawal symptoms.  Physical Findings: AIMS: Facial and Oral Movements Muscles of Facial Expression: None, normal Lips and Perioral Area: None, normal Jaw: None, normal Tongue: None, normal,Extremity Movements Upper (arms, wrists, hands, fingers): None, normal Lower (legs, knees, ankles, toes): None, normal, Trunk Movements Neck, shoulders, hips: None, normal, Overall Severity Severity of abnormal movements (highest score from questions above): None, normal Incapacitation due to abnormal  movements: None, normal Patient's awareness of abnormal movements (rate only patient's report): No Awareness, Dental Status Current problems with teeth and/or dentures?: No Does patient usually wear dentures?: No  CIWA:  CIWA-Ar Total: 8 COWS:  COWS Total Score: 4  Musculoskeletal: Strength & Muscle Tone: within normal limits Gait & Station: normal Patient leans: N/A  Psychiatric Specialty Exam: Physical Exam  ROS  Blood pressure 105/68, pulse 98, temperature 98.4 F (36.9 C), temperature source Oral, resp. rate 18, height _0  (1.651 m), weight 66 kg, SpO2 99 %, unknown if currently breastfeeding.Body mass index is 24.21 kg/m.  General Appearance: Casual  Eye Contact:  Minimal  Speech:  Pressured  Volume:  Increased  Mood:  Angry and Irritable  Affect:  Congruent  Thought Process:  Goal Directed  Orientation:  Full (Time, Place, and Person)  Thought Content:  Tangential  Suicidal Thoughts:  No  Homicidal Thoughts:  No  Memory:  Immediate;   Fair  Judgement:  Impaired  Insight:  Shallow  Psychomotor Activity:  Normal  Concentration:  Concentration: Fair  Recall:  [place] of Knowledge:  Fair  Language:  Fair  Akathisia:  Negative  Handed:  Right  AIMS (if indicated):     Assets:  Resilience  ADL's:  Intact  Cognition:  WNL  Sleep:  Number of Hours: 3     Have you used any form of tobacco in the last 30 days? (Cigarettes, Smokeless Tobacco, Cigars, and/or Pipes): Yes  Has this patient used any form of tobacco in the last 30 days? (Cigarettes, Smokeless Tobacco, Cigars, and/or Pipes) Yes, No  Blood Alcohol level:  Lab Results  Component Value Date   ETH 280 (H) 03/14/2019   ETH <5 11/57/2620    Metabolic Disorder Labs:  Lab Results  Component Value Date   HGBA1C 5.3 02/12/2016   MPG 105 02/12/2016   No results found for: PROLACTIN Lab Results  Component Value Date   CHOL 156 02/12/2016   TRIG 70 02/12/2016   HDL 57 02/12/2016   CHOLHDL 2.7  02/12/2016   VLDL 14 02/12/2016   LDLCALC 85 02/12/2016    See Psychiatric Specialty Exam and Suicide Risk Assessment completed by Attending Physician prior to discharge.  Discharge destination:  Home  Is patient on multiple antipsychotic therapies at discharge:  No   Has Patient had three or more failed trials of antipsychotic monotherapy by history:  No  Recommended Plan for Multiple Antipsychotic Therapies: NA   Allergies as of 03/17/2019   No Known Allergies     Medication List    STOP taking these medications   acetaminophen 325 MG tablet Commonly known as:  TYLENOL   benzocaine-Menthol 20-0.5 % Aero Commonly known as:  DERMOPLAST   ibuprofen 600 MG tablet Commonly known  as:  ADVIL,MOTRIN   methocarbamol 500 MG tablet Commonly known as:  ROBAXIN   prenatal multivitamin Tabs tablet       Signed: Johnn Hai, MD 03/18/2019, 10:42 AM

## 2019-12-04 ENCOUNTER — Encounter (HOSPITAL_COMMUNITY): Payer: Self-pay | Admitting: Emergency Medicine

## 2019-12-04 ENCOUNTER — Other Ambulatory Visit: Payer: Self-pay

## 2019-12-04 ENCOUNTER — Emergency Department (HOSPITAL_COMMUNITY): Payer: Self-pay

## 2019-12-04 ENCOUNTER — Emergency Department (HOSPITAL_COMMUNITY)
Admission: EM | Admit: 2019-12-04 | Discharge: 2019-12-05 | Disposition: A | Discharge status: Still In Hospital | Payer: Self-pay | Attending: Obstetrics and Gynecology | Admitting: Obstetrics and Gynecology

## 2019-12-04 DIAGNOSIS — Z87891 Personal history of nicotine dependence: Secondary | ICD-10-CM | POA: Insufficient documentation

## 2019-12-04 DIAGNOSIS — R519 Headache, unspecified: Secondary | ICD-10-CM | POA: Insufficient documentation

## 2019-12-04 DIAGNOSIS — O034 Incomplete spontaneous abortion without complication: Secondary | ICD-10-CM | POA: Diagnosis present

## 2019-12-04 DIAGNOSIS — O074 Failed attempted termination of pregnancy without complication: Secondary | ICD-10-CM | POA: Insufficient documentation

## 2019-12-04 DIAGNOSIS — A419 Sepsis, unspecified organism: Secondary | ICD-10-CM

## 2019-12-04 DIAGNOSIS — R509 Fever, unspecified: Secondary | ICD-10-CM | POA: Insufficient documentation

## 2019-12-04 DIAGNOSIS — Z20828 Contact with and (suspected) exposure to other viral communicable diseases: Secondary | ICD-10-CM | POA: Insufficient documentation

## 2019-12-04 LAB — COMPREHENSIVE METABOLIC PANEL
ALT: 16 U/L (ref 0–44)
AST: 18 U/L (ref 15–41)
Albumin: 3.7 g/dL (ref 3.5–5.0)
Alkaline Phosphatase: 87 U/L (ref 38–126)
Anion gap: 11 (ref 5–15)
BUN: 8 mg/dL (ref 6–20)
CO2: 23 mmol/L (ref 22–32)
Calcium: 9.1 mg/dL (ref 8.9–10.3)
Chloride: 102 mmol/L (ref 98–111)
Creatinine, Ser: 0.62 mg/dL (ref 0.44–1.00)
GFR calc Af Amer: >60 mL/min (ref >60)
GFR calc non Af Amer: >60 mL/min (ref >60)
Glucose, Bld: 102 mg/dL — ABNORMAL HIGH (ref 70–99)
Potassium: 3.7 mmol/L (ref 3.5–5.1)
Sodium: 136 mmol/L (ref 135–145)
Total Bilirubin: 0.2 mg/dL — ABNORMAL LOW (ref 0.3–1.2)
Total Protein: 7.5 g/dL (ref 6.5–8.1)

## 2019-12-04 LAB — CBC WITH DIFFERENTIAL/PLATELET
Abs Immature Granulocytes: 0.04 10*3/uL (ref 0.00–0.07)
Basophils Absolute: 0 10*3/uL (ref 0.0–0.1)
Basophils Relative: 0 %
Eosinophils Absolute: 0 10*3/uL (ref 0.0–0.5)
Eosinophils Relative: 0 %
HCT: 29.6 % — ABNORMAL LOW (ref 36.0–46.0)
Hemoglobin: 9.9 g/dL — ABNORMAL LOW (ref 12.0–15.0)
Immature Granulocytes: 1 %
Lymphocytes Relative: 11 %
Lymphs Abs: 1 10*3/uL (ref 0.7–4.0)
MCH: 31.8 pg (ref 26.0–34.0)
MCHC: 33.4 g/dL (ref 30.0–36.0)
MCV: 95.2 fL (ref 80.0–100.0)
Monocytes Absolute: 0.6 10*3/uL (ref 0.1–1.0)
Monocytes Relative: 7 %
Neutro Abs: 7.2 10*3/uL (ref 1.7–7.7)
Neutrophils Relative %: 81 %
Platelets: 205 10*3/uL (ref 150–400)
RBC: 3.11 MIL/uL — ABNORMAL LOW (ref 3.87–5.11)
RDW: 13.9 % (ref 11.5–15.5)
WBC: 8.9 10*3/uL (ref 4.0–10.5)
nRBC: 0 % (ref 0.0–0.2)

## 2019-12-04 LAB — I-STAT BETA HCG BLOOD, ED (MC, WL, AP ONLY): I-stat hCG, quantitative: 142 m[IU]/mL — ABNORMAL HIGH (ref <5)

## 2019-12-04 LAB — PROTIME-INR
INR: 0.9 (ref 0.8–1.2)
Prothrombin Time: 12.2 seconds (ref 11.4–15.2)

## 2019-12-04 LAB — POC SARS CORONAVIRUS 2 AG -  ED: SARS Coronavirus 2 Ag: NEGATIVE

## 2019-12-04 LAB — LACTIC ACID, PLASMA: Lactic Acid, Venous: 1.1 mmol/L (ref 0.5–1.9)

## 2019-12-04 MED ORDER — LORAZEPAM 2 MG/ML IJ SOLN
0.5000 mg | Freq: Once | INTRAMUSCULAR | Status: DC
  Filled 2019-12-04: qty 1

## 2019-12-04 MED ORDER — ACETAMINOPHEN 325 MG PO TABS
650.0000 mg | ORAL_TABLET | Freq: Once | ORAL | Status: AC | PRN
  Administered 2019-12-04: 21:00:00 650 mg via ORAL
  Filled 2019-12-04: qty 2

## 2019-12-04 MED ORDER — VANCOMYCIN HCL 10 G IV SOLR
1500.0000 mg | Freq: Once | INTRAVENOUS | Status: AC
  Administered 2019-12-04: 1500 mg via INTRAVENOUS
  Filled 2019-12-04: qty 1500

## 2019-12-04 MED ORDER — METOCLOPRAMIDE HCL 5 MG/ML IJ SOLN
10.0000 mg | Freq: Once | INTRAMUSCULAR | Status: AC
  Administered 2019-12-04: 10 mg via INTRAVENOUS
  Filled 2019-12-04: qty 2

## 2019-12-04 MED ORDER — SODIUM CHLORIDE 0.9 % IV SOLN
2.0000 g | Freq: Once | INTRAVENOUS | Status: AC
  Administered 2019-12-04: 23:00:00 2 g via INTRAVENOUS
  Filled 2019-12-04: qty 20

## 2019-12-04 MED ORDER — SODIUM CHLORIDE 0.9 % IV BOLUS
30.0000 mL/kg | Freq: Once | INTRAVENOUS | Status: AC
  Administered 2019-12-04: 1000 mL via INTRAVENOUS

## 2019-12-04 MED ORDER — ACETAMINOPHEN 325 MG PO TABS
650.0000 mg | ORAL_TABLET | Freq: Once | ORAL | Status: DC
  Filled 2019-12-04: qty 2

## 2019-12-04 MED ORDER — DIPHENHYDRAMINE HCL 50 MG/ML IJ SOLN
25.0000 mg | Freq: Once | INTRAMUSCULAR | Status: AC
  Administered 2019-12-04: 22:00:00 25 mg via INTRAVENOUS
  Filled 2019-12-04: qty 1

## 2019-12-04 MED ORDER — SODIUM CHLORIDE (PF) 0.9 % IJ SOLN
INTRAMUSCULAR | Status: AC
  Administered 2019-12-05: 10 mL
  Filled 2019-12-04: qty 50

## 2019-12-04 MED ORDER — DEXAMETHASONE SODIUM PHOSPHATE 10 MG/ML IJ SOLN
10.0000 mg | Freq: Once | INTRAMUSCULAR | Status: AC
  Administered 2019-12-04: 10 mg via INTRAVENOUS
  Filled 2019-12-04: qty 1

## 2019-12-04 MED ORDER — IOHEXOL 350 MG/ML SOLN
75.0000 mL | Freq: Once | INTRAVENOUS | Status: AC | PRN
  Administered 2019-12-04: 75 mL via INTRAVENOUS

## 2019-12-04 NOTE — ED Triage Notes (Signed)
Patient reports headache since abortion on 11/13 but worsening x1 week. Denies N/V.

## 2019-12-04 NOTE — ED Notes (Signed)
Patient transported to CT 

## 2019-12-04 NOTE — Progress Notes (Signed)
A consult was received from an ED physician for vancomycin per pharmacy dosing.  The patient's profile has been reviewed for ht/wt/allergies/indication/available labs.   A one time order has been placed for Vancomycin 1500 mg.  Further antibiotics/pharmacy consults should be ordered by admitting physician if indicated.                       Thank you, Dorrene German 12/04/2019  10:08 PM

## 2019-12-04 NOTE — ED Provider Notes (Addendum)
[facility]-EMERGENCY DEPT Provider Note   CSN: [id number] Arrival date & time: 12/04/19  2044     History Chief Complaint  Patient presents with  . Headache    Stacy Peterson is a 28 y.o. female reports a history of medical abortion approximately 1 month ago (11/13) at [redacted] weeks gestation, presenting to the ED with headache and fevers and chills and vaginal discharge. She reports she had a medically induced abortion from [facility] on 11/13.  She does not follow with any OBGYN. She reports she did pass several fetal parts and clots after her medically induced abortion.  She complains of vaginal discharge which continues to today.  Believes it is thick, and reporting also clots still passing from her vagina.  She has cramping abdominal pain but no significant abd tenderness.  Separately, she reports she has had near daily persistent headaches for over a month, beginning prior to inducing her abortion.  The headaches are frontally located and worse in the morning.  They occur nearly every day.  She has NO prior hx of headaches or migraines, she tells me.  She said in the past week the headaches have become even more severe.  They are associated with photophobia.  She is nauseated and vomiting at home today.  Her pain is 10/10.  No hx of IVDA   HPI      Past Medical History:  Diagnosis Date  . Bipolar 1 disorder (HCC)   . Chlamydia   . Depression   . Preterm labor   . Schizophrenia (HCC)   . Shingles   . Trichomonas vaginitis     Patient Active Problem List   Diagnosis Date Noted  . Retained products of conception following abortion 12/05/2019  . Fever of unknown origin 12/05/2019  . Substance induced mood disorder (HCC)   . Pneumothorax 02/21/2019  . 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 Marijuana use 12/11/2016  . Recurrent major depression (HCC) 02/11/2016  . Depression, major, recurrent, 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/11/2016  . PTSD  (post-traumatic stress disorder) 02/11/2016  . Polysubstance dependence (HCC) 02/11/2016  . Intentional overdose of drug in tablet form (HCC)   . Schizoaffective disorder, bipolar type (HCC) 01/17/2016  . Drug overdose, intentional (HCC) 01/17/2016  . 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 Left-sided low back pain with sciatica 04/30/2014    Past Surgical History:  Procedure Laterality Date  . KNEE SURGERY       OB History    Gravida  5   Para  4   Term  3   Preterm  1   AB  1   Living  3     SAB  0   TAB  0   Ectopic      Multiple  0   Live Births  3           Family History  Problem Relation Age of Onset  . Diabetes Paternal Grandmother   . Diabetes Paternal Grandfather     Social History   Tobacco Use  . Smoking status: Former Smoker    Packs/day: 0.25    Types: Cigarettes  . Smokeless tobacco: Never Used  Substance Use Topics  . Alcohol use: No    Alcohol/week: 12.0 standard drinks    Types: 12 Cans of beer per week  . Drug use: Yes    Types: Marijuana    Comment: marijuana inearly pregnancy    Home Medications Prior to Admission medications   Not  on File    Allergies    Patient has no known allergies.  Review of Systems   Review of Systems  Constitutional: Positive for chills, diaphoresis, fatigue and fever.  Eyes: Positive for photophobia and visual disturbance.  Respiratory: Negative for cough and shortness of breath.   Cardiovascular: Negative for chest pain and palpitations.  Gastrointestinal: Positive for abdominal pain, nausea and vomiting.  Genitourinary: Positive for pelvic pain, vaginal bleeding, vaginal discharge and vaginal pain. Negative for genital sores.  Musculoskeletal: Negative for arthralgias and back pain.  Skin: Negative for pallor and rash.  Neurological: Positive for light-headedness and headaches. Negative for syncope, facial asymmetry and speech difficulty.  All other systems reviewed and are  negative.   Physical Exam Updated Vital Signs BP 110/76   Pulse 60   Temp (!) 101.3 F (38.5 C) (Oral)   Resp (!) 25   Ht  (1.651 m)   Wt 65.8 kg   LMP 08/26/2019   SpO2 100%   BMI 24.13 kg/m   Physical Exam Vitals and nursing note reviewed.  Constitutional:      General: She is in acute distress.     Appearance: She is well-developed. She is ill-appearing and diaphoretic.     Comments: Sobbing, lying with eyes closed  HENT:     Head: Normocephalic and atraumatic.  Eyes:     Extraocular Movements: Extraocular movements intact.     Conjunctiva/sclera: Conjunctivae normal.     Pupils: Pupils are equal, round, and reactive to light.  Cardiovascular:     Rate and Rhythm: Regular rhythm. Tachycardia present.     Heart sounds: Normal heart sounds.  Pulmonary:     Effort: Pulmonary effort is normal. No respiratory distress.     Breath sounds: Normal breath sounds.  Abdominal:     Palpations: Abdomen is soft.     Tenderness: There is no abdominal tenderness.     Comments: Mild suprapubic tenderness  Genitourinary:    Comments: Exam performed with chaperone present. External: Normal external female genitalia. No lesions, rashes, drainage, or suspicious lymph nodes. Internal: Minimal CMT. Cervix closed and without erythema. Minimal physiologic lochia.No lacerations. No foreign bodies. Musculoskeletal:     Cervical back: Normal range of motion and neck supple.  Skin:    General: Skin is warm.  Neurological:     Mental Status: She is alert.     GCS: GCS eye subscore is 4. GCS verbal subscore is 5. GCS motor subscore is 6.  Psychiatric:        Mood and Affect: Mood normal.        Behavior: Behavior normal.     ED Results / Procedures / Treatments   Labs (all labs ordered are listed, but only abnormal results are displayed) Labs Reviewed  COMPREHENSIVE METABOLIC PANEL - Abnormal; Notable for the following components:      Result Value   Glucose, Bld 102 (*)     Total Bilirubin 0.2 (*)    All other components within normal limits  CBC WITH DIFFERENTIAL/PLATELET - Abnormal; Notable for the following components:   RBC 3.11 (*)    Hemoglobin 9.9 (*)    HCT 29.6 (*)    All other components within normal limits  URINALYSIS, ROUTINE W REFLEX MICROSCOPIC - Abnormal; Notable for the following components:   Color, Urine STRAW (*)    Hgb urine dipstick SMALL (*)    Leukocytes,Ua TRACE (*)    All other components within normal limits  PROTEIN, CSF -  Abnormal; Notable for the following components:   Total  Protein, CSF 11 (*)    All other components within normal limits  I-STAT BETA HCG BLOOD, ED ([facility], [facility], [facility] ONLY) - Abnormal; Notable for the following components:   I-stat hCG, quantitative 142.0 (*)    All other components within normal limits  CULTURE, BLOOD (ROUTINE X 2)  CULTURE, BLOOD (ROUTINE X 2)  URINE CULTURE  CSF CULTURE  GRAM STAIN  WET PREP, GENITAL  LACTIC ACID, PLASMA  PROTIME-INR  GLUCOSE, CSF  LACTIC ACID, PLASMA  APTT  CSF CELL COUNT WITH DIFFERENTIAL  CSF CELL COUNT WITH DIFFERENTIAL  HERPES SIMPLEX VIRUS(HSV) DNA BY PCR  VDRL, CSF  POC SARS CORONAVIRUS 2 AG -  ED  GC/CHLAMYDIA PROBE AMP ([place]) NOT AT [facility]    EKG None  Radiology DG Chest 2 View  Result Date: 12/04/2019 CLINICAL DATA:  Headaches.  Assess for sepsis EXAM: CHEST - 2 VIEW COMPARISON:  March 15, 2019 FINDINGS: The heart size and mediastinal contours are within normal limits. Both lungs are clear. The visualized skeletal structures are unremarkable. IMPRESSION: No active cardiopulmonary disease. Electronically Signed   By: Sherian Rein M.D.   On: 12/04/2019 21:31   US OB Comp < 14 Wks  Result Date: 12/05/2019 CLINICAL DATA:  Status post abortion with concern for retained products of conception. EXAM: OBSTETRIC <14 WK [place] AND TRANSVAGINAL OB US TECHNIQUE: Both transabdominal and transvaginal ultrasound examinations were performed for complete evaluation  of the gestation as well as the maternal uterus, adnexal regions, and pelvic cul-de-sac. Transvaginal technique was performed to assess early pregnancy. COMPARISON:  None. FINDINGS: Intrauterine gestational sac: None Yolk sac:  Not Visualized. Embryo:  Not Visualized. Cardiac Activity: Not Visualized. Subchorionic hemorrhage:  None visualized. Maternal uterus/adnexae: The right ovary measures 2.8 x 2.7 x 2 cm. The left ovary measures 2.7 x 1.9 x 1.6 cm. At the endometrial fundus, there is a heterogeneous area measuring approximately 3.7 x 3.3 x 2.7 cm. There are cystic and solid-appearing areas in this location. There is hyperemia there is a trace amount of free fluid in the patient's pelvis. IMPRESSION: 1. No IUP identified. 2. Thickened heterogeneous appearance of the endometrium at the fundus raises concern for retained products of conception. 3. Trace amount of free fluid in the patient's pelvis. Electronically Signed   By: Katherine Mantle M.D.   On: 12/05/2019 01:48   US OB Transvaginal  Result Date: 12/05/2019 CLINICAL DATA:  Status post abortion with concern for retained products of conception. EXAM: OBSTETRIC <14 WK [place] AND TRANSVAGINAL OB US TECHNIQUE: Both transabdominal and transvaginal ultrasound examinations were performed for complete evaluation of the gestation as well as the maternal uterus, adnexal regions, and pelvic cul-de-sac. Transvaginal technique was performed to assess early pregnancy. COMPARISON:  None. FINDINGS: Intrauterine gestational sac: None Yolk sac:  Not Visualized. Embryo:  Not Visualized. Cardiac Activity: Not Visualized. Subchorionic hemorrhage:  None visualized. Maternal uterus/adnexae: The right ovary measures 2.8 x 2.7 x 2 cm. The left ovary measures 2.7 x 1.9 x 1.6 cm. At the endometrial fundus, there is a heterogeneous area measuring approximately 3.7 x 3.3 x 2.7 cm. There are cystic and solid-appearing areas in this location. There is hyperemia there is a trace amount  of free fluid in the patient's pelvis. IMPRESSION: 1. No IUP identified. 2. Thickened heterogeneous appearance of the endometrium at the fundus raises concern for retained products of conception. 3. Trace amount of free fluid in the patient's pelvis.  Electronically Signed   By: Katherine Mantle M.D.   On: 12/05/2019 01:48   CT ABDOMEN PELVIS W CONTRAST  Result Date: 12/04/2019 CLINICAL DATA:  History of recent medical induced abortion with fevers EXAM: CT ABDOMEN AND PELVIS WITH CONTRAST TECHNIQUE: Multidetector CT imaging of the abdomen and pelvis was performed using the standard protocol following bolus administration of intravenous contrast. CONTRAST:  75mL OMNIPAQUE IOHEXOL 350 MG/ML SOLN COMPARISON:  02/21/2019 FINDINGS: Lower chest: No acute abnormality. Hepatobiliary: No focal liver abnormality is seen. No gallstones, gallbladder wall thickening, or biliary dilatation. Pancreas: Unremarkable. No pancreatic ductal dilatation or surrounding inflammatory changes. Spleen: Normal in size without focal abnormality. Adrenals/Urinary Tract: The adrenal glands are within normal limits. Kidneys are well visualized bilaterally. No renal calculi or obstructive changes are seen. Bladder is well distended. Stomach/Bowel: No obstructive or inflammatory changes of the colon are seen. The appendix is air filled. No small bowel abnormality is seen. Vascular/Lymphatic: No significant vascular findings are present. No enlarged abdominal or pelvic lymph nodes. Reproductive: No adnexal mass is noted. The uterus is well visualized. There is enhancing and hypodense tissue identified within the uterine cavity. Given the patient's clinical history these changes likely represent retained products of conception. No air is noted within the endometrial canal to suggest definitive abscess. Other: No abdominal wall hernia or abnormality. No abdominopelvic ascites. Musculoskeletal: No acute or significant osseous findings. IMPRESSION:  Enhancing tissue within the endometrial canal highly suspicious for retained products of conception. No air is noted to suggest acute abscess. No other focal abnormality is noted. Critical Value/emergent results were called by telephone at the time of interpretation on 12/04/2019 at 11:33 pm to Dr. Alvester Chou , who verbally acknowledged these results. Electronically Signed   By: Alcide Clever M.D.   On: 12/04/2019 23:33   CT VENOGRAM HEAD  Result Date: 12/04/2019 CLINICAL DATA:  Dural venous sinus thrombosis suspected Persistent daily morning headache after induced abortion, evaluate for thrombosis EXAM: CT VENOGRAM HEAD TECHNIQUE: Noncontrast CT images of the head were obtained in the axial plane and re-formatted to coronal and sagittal projections without intravenous contrast. Vena graphic phase images of the brain were obtained following the administration of intravenous contrast with multiplanar and MIP reformats. CONTRAST:  75mL OMNIPAQUE IOHEXOL 350 MG/ML SOLN COMPARISON:  03/15/2019 FINDINGS: CT HEAD FINDINGS Brain: There is no mass, hemorrhage or extra-axial collection. The size and configuration of the ventricles and extra-axial CSF spaces are normal. The brain parenchyma is normal, without acute or chronic infarction. Vascular: No abnormal hyperdensity of the major intracranial arteries or dural venous sinuses. No intracranial atherosclerosis. Skull: The visualized skull base, calvarium and extracranial soft tissues are normal. Sinuses/Orbits: No fluid levels or advanced mucosal thickening of the visualized paranasal sinuses. No mastoid or middle ear effusion. The orbits are normal. CTV HEAD FINDINGS Superior sagittal sinus: Normal. Straight sinus: Normal. Inferior sagittal sinus, vein of Galen and internal cerebral veins: Normal. Transverse sinuses: Normal. Sigmoid sinuses: Normal. Visualized jugular veins: Normal. MIPs confirm findings. IMPRESSION: 1. No dural venous sinus thrombosis. 2. Normal  head CT. Electronically Signed   By: Deatra Robinson M.D.   On: 12/04/2019 23:21    Procedures .Critical Care Performed by: Terald Sleeper, MD Authorized by: Terald Sleeper, MD   Critical care provider statement:    Critical care time (minutes):  40   Critical care was necessary to treat or prevent imminent or life-threatening deterioration of the following conditions:  Sepsis   Critical care was time  spent personally by me on the following activities:  Discussions with consultants, evaluation of patient's response to treatment, examination of patient, ordering and performing treatments and interventions, ordering and review of laboratory studies, ordering and review of radiographic studies, pulse oximetry, re-evaluation of patient's condition, obtaining history from patient or surrogate and review of old charts Comments:     IV fluids, antibiotics, discussion with consultants .Lumbar Puncture  Date/Time: 12/05/2019 1:50 AM Performed by: Wyvonnia Dusky, MD Authorized by: Wyvonnia Dusky, MD   Consent:    Consent obtained:  Verbal   Consent given by:  Patient   Risks discussed:  Infection, headache, pain and repeat procedure   Alternatives discussed:  No treatment Pre-procedure details:    Procedure purpose:  Diagnostic   Preparation: Patient was prepped and draped in usual sterile fashion   Sedation:    Sedation type:  Anxiolysis Anesthesia (see MAR for exact dosages):    Anesthesia method:  Local infiltration   Local anesthetic:  Lidocaine 1% w/o epi Procedure details:    Lumbar space:  L4-L5 interspace   Patient position:  R lateral decubitus   Needle gauge:  20   Needle length (in):  3.5   Ultrasound guidance: no     Number of attempts:  1   Fluid appearance:  Clear   Tubes of fluid:  4   Total volume (ml):  4 Post-procedure:    Puncture site:  Adhesive bandage applied and direct pressure applied   Patient tolerance of procedure:  Tolerated well, no immediate  complications   (including critical care time)  Medications Ordered in ED Medications  acetaminophen (TYLENOL) tablet 650 mg (650 mg Oral Not Given 12/04/19 2233)  sodium chloride (PF) 0.9 % injection (has no administration in time range)  LORazepam (ATIVAN) injection 0.5 mg (0.5 mg Intravenous Not Given 12/05/19 0148)  cefTRIAXone (ROCEPHIN) 2 g in sodium chloride 0.9 % 100 mL IVPB (has no administration in time range)  acetaminophen (TYLENOL) tablet 650 mg (650 mg Oral Given 12/04/19 2109)  cefTRIAXone (ROCEPHIN) 2 g in sodium chloride 0.9 % 100 mL IVPB (0 g Intravenous Stopped 12/04/19 2344)  dexamethasone (DECADRON) injection 10 mg (10 mg Intravenous Given 12/04/19 2224)  sodium chloride 0.9 % bolus 1,974 mL (0 mL/kg  65.8 kg Intravenous Stopped 12/05/19 0148)  metoCLOPramide (REGLAN) injection 10 mg (10 mg Intravenous Given 12/04/19 2225)  diphenhydrAMINE (BENADRYL) injection 25 mg (25 mg Intravenous Given 12/04/19 2224)  vancomycin (VANCOCIN) 1,500 mg in sodium chloride 0.9 % 500 mL IVPB (1,500 mg Intravenous New Bag/Given 12/04/19 2323)  iohexol (OMNIPAQUE) 350 MG/ML injection 75 mL (75 mLs Intravenous Contrast Given 12/04/19 2242)  midazolam (VERSED) 2 MG/2ML injection (1 mg  Given 12/05/19 0031)  LORazepam (ATIVAN) injection 1 mg (1 mg Intravenous Given 12/05/19 0154)    ED Course  I have reviewed the triage vital signs and the nursing notes.  Pertinent labs & imaging results that were available during my care of the patient were reviewed by me and considered in my medical decision making (see chart for details).  This is a 28 year old female presenting to the emergency department with complaint of acute on chronic headaches.  Headaches have been ongoing for approximately 1 month, with no prior history of headaches.  It is concerning that she is having headaches in the mornings in particular when she wakes up, and that they are frontally located.  There is raise concern for  possible thrombotic event such as sinus thrombosis given her  pregnancy status.  Similarly, I am concerned with her new pattern of headaches in the past few days, associated with fevers and photophobia.  The patient is extremely uncomfortable and does appear extremely photophobic on my exam.  She cannot tolerate any light.  She is diaphoretic.  There is no discernible neck stiffness but she is very uncomfortable.  This raises concern for meningitis.  She will need a lumbar puncture.  Will administer IV vancomycin and ceftriaxone and Decadron as soon as possible as part of her sepsis work-up, while we are awaiting a bed out of her triage section to perform a lumbar puncture.  She also reports continued vaginal discharge and passing of clots a month after medical induced abortion.  This raises concern for retained products of conception as a possible cause of her sepsis.  Plan to obtain a CT scan of the abdomen pelvis along with a CT scan of her head, to evaluate for intra-abdominal sources of infection as well as possible retained products.  Ultimately she will likely need a pelvic ultrasound if there continues to be concern for retained POC, but is so incredibly uncomfortable on arrival, I do not believe this can be obtained in an expedient manner.    Separately we will swab her for Covid.      Clinical Course as of Dec 04 305  Thu Dec 04, 2019  2331 Spoke to Dr Sharlotte Alamo of OBGYN who is recommending a pelvic ultrasound, okay with Vanc + Ceftriaxone as antibiotics, and to contact them again after the ultrasound and LP to rule out meningitis.   [MT]  2333  IMPRESSION: 1. No dural venous sinus thrombosis. 2. Normal head CT.   [MT]  2336  IMPRESSION: Enhancing tissue within the endometrial canal highly suspicious for retained products of conception. No air is noted to suggest acute abscess.  No other focal abnormality is noted.   [MT]  Fri Dec 05, 2019  5520 Successful LP, clear CSF fluid,  LP performed within 1 hour of initiation of antibiotics   [MT]  0044 HA improved with meds.  Patient now pending vaginal ultrasound to evaluate for retained products of conception   [MT]  0130 Lactate unremarkable, no significant leukocytosis.   [MT]  0131 SARS Coronavirus 2 Ag: NEGATIVE [MT]  0154 Patient signed out to Dr Ellwood Handler overnight attending with plan to follow up on pelvic ultrasound, if retained POC will transfer to [facility]  Otherwise will need admission to our hospital for meningitis f/u and treatment   [MT]  0158 IMPRESSION: 1. No IUP identified. 2. Thickened heterogeneous appearance of the endometrium at the fundus raises concern for retained products of conception. 3. Trace amount of free fluid in the patient's pelvis.   [MT]  0220 Dr Macon Large reports they will add on the patient to the [facility] ED OR schedule tomorrow morning   [MT]  0303 Signed out to Dr Ellwood Handler with a plan to follow up on CSF preliminary results, anticipate admission to hospitalist, as OB attending feels this is less likely sepsis due to intrauterine infection at this time, with no leukocytosis, abd pain, or fever.  However OB team is still planning to take her to the OR to evaluate for retained products given the ultrasound findings.  She will still need IV antibiotics until meningitis can be ruled out   [MT]    Clinical Course User Index [MT] Deetta Siegmann, Kermit Balo, MD    Final Clinical Impression(s) / ED Diagnoses Final diagnoses:  Sepsis, due  to unspecified organism, unspecified whether acute organ dysfunction present (HCC)  Intractable headache, unspecified chronicity pattern, unspecified headache type    Rx / DC Orders ED Discharge Orders    None       Cleophus Mendonsa, Kermit Balo, MD 12/05/19 4098    Terald Sleeper, MD 12/05/19 0221    Terald Sleeper, MD 12/05/19 [phone]

## 2019-12-05 ENCOUNTER — Emergency Department (HOSPITAL_COMMUNITY): Payer: Self-pay | Admitting: Certified Registered"

## 2019-12-05 ENCOUNTER — Other Ambulatory Visit: Payer: Self-pay | Admitting: Obstetrics and Gynecology

## 2019-12-05 ENCOUNTER — Encounter (HOSPITAL_COMMUNITY)
Admission: EM | Disposition: A | Discharge status: Still In Hospital | Payer: Self-pay | Source: Home / Self Care | Attending: Emergency Medicine

## 2019-12-05 ENCOUNTER — Emergency Department: Payer: Medicaid Other

## 2019-12-05 ENCOUNTER — Emergency Department (HOSPITAL_COMMUNITY): Payer: Self-pay

## 2019-12-05 ENCOUNTER — Encounter (HOSPITAL_COMMUNITY): Payer: Self-pay | Admitting: Obstetrics & Gynecology

## 2019-12-05 DIAGNOSIS — O034 Incomplete spontaneous abortion without complication: Secondary | ICD-10-CM | POA: Diagnosis present

## 2019-12-05 DIAGNOSIS — R509 Fever, unspecified: Secondary | ICD-10-CM | POA: Diagnosis present

## 2019-12-05 DIAGNOSIS — G8929 Other chronic pain: Secondary | ICD-10-CM

## 2019-12-05 HISTORY — PX: DILATION AND EVACUATION: SHX1459

## 2019-12-05 LAB — URINALYSIS, ROUTINE W REFLEX MICROSCOPIC
Bacteria, UA: NONE SEEN
Bilirubin Urine: NEGATIVE
Glucose, UA: NEGATIVE mg/dL
Ketones, ur: NEGATIVE mg/dL
Nitrite: NEGATIVE
Protein, ur: NEGATIVE mg/dL
Specific Gravity, Urine: 1.021 (ref 1.005–1.030)
pH: 6 (ref 5.0–8.0)

## 2019-12-05 LAB — WET PREP, GENITAL
Sperm: NONE SEEN
Trich, Wet Prep: NONE SEEN
Yeast Wet Prep HPF POC: NONE SEEN

## 2019-12-05 LAB — CSF CELL COUNT WITH DIFFERENTIAL
RBC Count, CSF: 3 /mm3 — ABNORMAL HIGH
RBC Count, CSF: 52 /mm3 — ABNORMAL HIGH
Tube #: 1
Tube #: 4
WBC, CSF: 0 /mm3 (ref 0–5)
WBC, CSF: 0 /mm3 (ref 0–5)

## 2019-12-05 LAB — RESPIRATORY PANEL BY RT PCR (FLU A&B, COVID)
Influenza A by PCR: NEGATIVE
Influenza B by PCR: NEGATIVE
SARS Coronavirus 2 by RT PCR: NEGATIVE

## 2019-12-05 LAB — PROTEIN, CSF: Total  Protein, CSF: 11 mg/dL — ABNORMAL LOW (ref 15–45)

## 2019-12-05 LAB — GLUCOSE, CSF: Glucose, CSF: 53 mg/dL (ref 40–70)

## 2019-12-05 SURGERY — DILATION AND EVACUATION, UTERUS
Anesthesia: General

## 2019-12-05 MED ORDER — LORAZEPAM 2 MG/ML IJ SOLN
1.0000 mg | Freq: Once | INTRAMUSCULAR | Status: AC
  Administered 2019-12-05: 1 mg via INTRAVENOUS

## 2019-12-05 MED ORDER — PROPOFOL 10 MG/ML IV BOLUS
INTRAVENOUS | Status: AC
  Filled 2019-12-05: qty 20

## 2019-12-05 MED ORDER — MIDAZOLAM HCL 2 MG/2ML IJ SOLN
INTRAMUSCULAR | Status: DC | PRN
  Administered 2019-12-05: 2 mg via INTRAVENOUS

## 2019-12-05 MED ORDER — HYDROMORPHONE HCL 1 MG/ML IJ SOLN
INTRAMUSCULAR | Status: AC
  Filled 2019-12-05: qty 1

## 2019-12-05 MED ORDER — 0.9 % SODIUM CHLORIDE (POUR BTL) OPTIME
TOPICAL | Status: DC | PRN
  Administered 2019-12-05: 10:00:00 1000 mL

## 2019-12-05 MED ORDER — MIDAZOLAM HCL 2 MG/2ML IJ SOLN
INTRAMUSCULAR | Status: AC
  Administered 2019-12-05: 01:00:00 1 mg
  Filled 2019-12-05: qty 2

## 2019-12-05 MED ORDER — LORAZEPAM 2 MG/ML IJ SOLN
1.0000 mg | Freq: Once | INTRAMUSCULAR | Status: AC
  Administered 2019-12-05: 1 mg via INTRAVENOUS
  Filled 2019-12-05: qty 1

## 2019-12-05 MED ORDER — SUCCINYLCHOLINE CHLORIDE 200 MG/10ML IV SOSY
PREFILLED_SYRINGE | INTRAVENOUS | Status: AC
  Filled 2019-12-05: qty 10

## 2019-12-05 MED ORDER — PROPOFOL 500 MG/50ML IV EMUL: INTRAVENOUS | Status: DC | PRN

## 2019-12-05 MED ORDER — BUPIVACAINE HCL (PF) 0.25 % IJ SOLN
INTRAMUSCULAR | Status: AC
  Filled 2019-12-05: qty 30

## 2019-12-05 MED ORDER — LACTATED RINGERS IV SOLN
INTRAVENOUS | Status: DC
  Administered 2019-12-05: 09:00:00 via INTRAVENOUS

## 2019-12-05 MED ORDER — HYDROMORPHONE HCL 1 MG/ML IJ SOLN: 0.2500 mg | INTRAMUSCULAR | Status: DC | PRN

## 2019-12-05 MED ORDER — SCOPOLAMINE 1 MG/3DAYS TD PT72
1.0000 | MEDICATED_PATCH | TRANSDERMAL | Status: DC
  Administered 2019-12-05: 1 via TRANSDERMAL
  Filled 2019-12-05: qty 1

## 2019-12-05 MED ORDER — PROMETHAZINE HCL 25 MG/ML IJ SOLN: 6.2500 mg | INTRAMUSCULAR | Status: DC | PRN

## 2019-12-05 MED ORDER — BUTALBITAL-APAP-CAFFEINE 50-325-40 MG PO CAPS: 1.0000 | ORAL_CAPSULE | Freq: Four times a day (QID) | ORAL | 3 refills | Status: DC | PRN

## 2019-12-05 MED ORDER — BUPIVACAINE HCL (PF) 0.25 % IJ SOLN
INTRAMUSCULAR | Status: DC | PRN
  Administered 2019-12-05: 10 mL

## 2019-12-05 MED ORDER — SODIUM CHLORIDE 0.9 % IV SOLN: 2.0000 g | Freq: Once | INTRAVENOUS | Status: DC

## 2019-12-05 MED ORDER — DEXAMETHASONE SODIUM PHOSPHATE 10 MG/ML IJ SOLN
INTRAMUSCULAR | Status: AC
  Filled 2019-12-05: qty 1

## 2019-12-05 MED ORDER — LORAZEPAM 2 MG/ML IJ SOLN
1.0000 mg | Freq: Once | INTRAMUSCULAR | Status: AC
  Administered 2019-12-05: 08:00:00 1 mg via INTRAVENOUS
  Filled 2019-12-05: qty 1

## 2019-12-05 MED ORDER — METHYLERGONOVINE MALEATE 0.2 MG/ML IJ SOLN
INTRAMUSCULAR | Status: AC
  Filled 2019-12-05: qty 1

## 2019-12-05 MED ORDER — LIDOCAINE 2% (20 MG/ML) 5 ML SYRINGE
INTRAMUSCULAR | Status: DC | PRN
  Administered 2019-12-05: 40 mg via INTRAVENOUS

## 2019-12-05 MED ORDER — FENTANYL CITRATE (PF) 100 MCG/2ML IJ SOLN
INTRAMUSCULAR | Status: AC
  Filled 2019-12-05: qty 2

## 2019-12-05 MED ORDER — MIDAZOLAM HCL 2 MG/2ML IJ SOLN
INTRAMUSCULAR | Status: AC
  Filled 2019-12-05: qty 2

## 2019-12-05 MED ORDER — METHYLERGONOVINE MALEATE 0.2 MG/ML IJ SOLN
INTRAMUSCULAR | Status: DC | PRN
  Administered 2019-12-05: .2 mg via INTRAMUSCULAR

## 2019-12-05 MED ORDER — PIPERACILLIN-TAZOBACTAM 3.375 G IVPB 30 MIN
3.3750 g | Freq: Once | INTRAVENOUS | Status: AC
  Administered 2019-12-05: 3.375 g via INTRAVENOUS
  Filled 2019-12-05: qty 50

## 2019-12-05 MED ORDER — IBUPROFEN 600 MG PO TABS: 600.0000 mg | ORAL_TABLET | Freq: Four times a day (QID) | ORAL | 3 refills | Status: DC | PRN

## 2019-12-05 MED ORDER — DEXAMETHASONE SODIUM PHOSPHATE 10 MG/ML IJ SOLN
INTRAMUSCULAR | Status: DC | PRN
  Administered 2019-12-05: 4 mg via INTRAVENOUS

## 2019-12-05 MED ORDER — ONDANSETRON HCL 4 MG/2ML IJ SOLN
INTRAMUSCULAR | Status: DC | PRN
  Administered 2019-12-05: 4 mg via INTRAVENOUS

## 2019-12-05 MED ORDER — FENTANYL CITRATE (PF) 250 MCG/5ML IJ SOLN
INTRAMUSCULAR | Status: DC | PRN
  Administered 2019-12-05 (×3): 50 ug via INTRAVENOUS

## 2019-12-05 MED ORDER — ACETAMINOPHEN 500 MG PO TABS: 1000.0000 mg | ORAL_TABLET | Freq: Once | ORAL | Status: DC

## 2019-12-05 MED ORDER — CHLOROPROCAINE HCL 1 % IJ SOLN
30.0000 mL | Freq: Once | INTRAMUSCULAR | Status: DC
  Filled 2019-12-05: qty 30

## 2019-12-05 MED ORDER — MEPERIDINE HCL 50 MG/ML IJ SOLN: 6.2500 mg | INTRAMUSCULAR | Status: DC | PRN

## 2019-12-05 MED ORDER — DOXYCYCLINE HYCLATE 100 MG IV SOLR
200.0000 mg | INTRAVENOUS | Status: AC
  Administered 2019-12-05: 200 mg via INTRAVENOUS
  Filled 2019-12-05: qty 200

## 2019-12-05 MED ORDER — LIDOCAINE HCL (CARDIAC) PF 100 MG/5ML IV SOSY: PREFILLED_SYRINGE | INTRAVENOUS | Status: DC | PRN

## 2019-12-05 MED ORDER — FERROUS SULFATE 325 (65 FE) MG PO TABS: 325.0000 mg | ORAL_TABLET | Freq: Two times a day (BID) | ORAL | 1 refills | Status: DC

## 2019-12-05 MED ORDER — DOCUSATE SODIUM 100 MG PO CAPS: 100.0000 mg | ORAL_CAPSULE | Freq: Two times a day (BID) | ORAL | 2 refills | Status: DC | PRN

## 2019-12-05 MED ORDER — ONDANSETRON HCL 4 MG/2ML IJ SOLN
INTRAMUSCULAR | Status: AC
  Filled 2019-12-05: qty 2

## 2019-12-05 MED ORDER — SUCCINYLCHOLINE CHLORIDE 200 MG/10ML IV SOSY
PREFILLED_SYRINGE | INTRAVENOUS | Status: DC | PRN
  Administered 2019-12-05: 80 mg via INTRAVENOUS

## 2019-12-05 MED ORDER — LIDOCAINE 2% (20 MG/ML) 5 ML SYRINGE
INTRAMUSCULAR | Status: AC
  Filled 2019-12-05: qty 5

## 2019-12-05 MED ORDER — KETOROLAC TROMETHAMINE 30 MG/ML IJ SOLN: 30.0000 mg | Freq: Once | INTRAMUSCULAR | Status: AC | PRN

## 2019-12-05 MED ORDER — PROPOFOL 10 MG/ML IV BOLUS
INTRAVENOUS | Status: DC | PRN
  Administered 2019-12-05: 200 mg via INTRAVENOUS

## 2019-12-05 MED ORDER — PROMETHAZINE HCL 25 MG/ML IJ SOLN
INTRAMUSCULAR | Status: AC
  Filled 2019-12-05: qty 1

## 2019-12-05 MED ORDER — OXYCODONE-ACETAMINOPHEN 5-325 MG PO TABS: 1.0000 | ORAL_TABLET | Freq: Four times a day (QID) | ORAL | 0 refills | Status: DC | PRN

## 2019-12-05 MED ORDER — OXYCODONE HCL 5 MG/5ML PO SOLN: 5.0000 mg | Freq: Once | ORAL | Status: DC | PRN

## 2019-12-05 MED ORDER — OXYCODONE HCL 5 MG PO TABS: 5.0000 mg | ORAL_TABLET | Freq: Once | ORAL | Status: DC | PRN

## 2019-12-05 SURGICAL SUPPLY — 18 items
CATH ROBINSON RED A/P 16FR (CATHETERS) ×2 IMPLANT
COVER SURGICAL LIGHT HANDLE (MISCELLANEOUS) ×3 IMPLANT
COVER WAND RF STERILE (DRAPES) IMPLANT
FILTER UTR ASPR ASSEMBLY (MISCELLANEOUS) ×2 IMPLANT
GLOVE BIOGEL PI IND STRL 6.5 (GLOVE) IMPLANT
GLOVE BIOGEL PI INDICATOR 6.5 (GLOVE) ×2
GLOVE SURG SS PI 6.0 STRL IVOR (GLOVE) ×2 IMPLANT
GOWN STRL REUS W/TWL LRG LVL3 (GOWN DISPOSABLE) ×2 IMPLANT
HOSE CONNECTING 18IN BERKELEY (TUBING) ×2 IMPLANT
KIT BERKELEY 1ST TRIMESTER 3/8 (MISCELLANEOUS) ×2 IMPLANT
KIT TURNOVER KIT A (KITS) ×2 IMPLANT
PACK VAGINAL MINOR WOMEN LF (CUSTOM PROCEDURE TRAY) IMPLANT
PAD OB MATERNITY 4.3X12.25 (PERSONAL CARE ITEMS) ×2 IMPLANT
PENCIL SMOKE EVACUATOR (MISCELLANEOUS) IMPLANT
SET BERKELEY SUCTION TUBING (SUCTIONS) ×2 IMPLANT
TOWEL OR 17X26 10 PK STRL BLUE (TOWEL DISPOSABLE) ×2 IMPLANT
VACURETTE 8 RIGID CVD (CANNULA) ×2 IMPLANT
VACURETTE 9 RIGID CVD (CANNULA) ×2 IMPLANT

## 2019-12-05 NOTE — Progress Notes (Signed)
A consult was received from an ED physician for zosyn per pharmacy dosing.  The patient's profile has been reviewed for ht/wt/allergies/indication/available labs.   A one time order has been placed for Zosyn 3.375 Gm.  Further antibiotics/pharmacy consults should be ordered by admitting physician if indicated.                       Thank you, Dorrene German 12/05/2019  5:10 AM

## 2019-12-05 NOTE — Anesthesia Postprocedure Evaluation (Signed)
Anesthesia Post Note  Patient: Stacy Peterson  Procedure(s) Performed: DILATATION AND EVACUATION WITH ULTRASOUND GUIDANCE (N/A )     Patient location during evaluation: PACU Anesthesia Type: General Level of consciousness: awake and alert, oriented and patient cooperative Pain management: pain level controlled Vital Signs Assessment: post-procedure vital signs reviewed and stable Respiratory status: spontaneous breathing, nonlabored ventilation and respiratory function stable Cardiovascular status: blood pressure returned to baseline and stable Postop Assessment: no apparent nausea or vomiting Anesthetic complications: no    Last Vitals:  Vitals:   12/05/19 0651 12/05/19 0900  BP:  104/71  Pulse:  73  Resp:  20  Temp: 37.2 C 37.1 C  SpO2:  100%    Last Pain:  Vitals:   12/05/19 0900  TempSrc: Oral  PainSc: 10-Worst pain ever                 Pervis Hocking

## 2019-12-05 NOTE — ED Notes (Signed)
Pt transported to PACU by OR staff. Belongings sent with OR staff. Pt had clothing, black cell phone with cracked screen, yellow bractlet in bag

## 2019-12-05 NOTE — Anesthesia Procedure Notes (Signed)
Procedure Name: Intubation Date/Time: 12/05/2019 9:40 AM Performed by: Niel Hummer, CRNA Pre-anesthesia Checklist: Patient identified, Emergency Drugs available, Suction available and Patient being monitored Patient Re-evaluated:Patient Re-evaluated prior to induction Oxygen Delivery Method: Circle system utilized Preoxygenation: Pre-oxygenation with 100% oxygen Induction Type: IV induction Ventilation: Mask ventilation without difficulty Laryngoscope Size: Mac and 3 Grade View: Grade I Tube type: Oral Tube size: 7.0 mm Number of attempts: 1 Airway Equipment and Method: Stylet Placement Confirmation: ETT inserted through vocal cords under direct vision,  positive ETCO2 and breath sounds checked- equal and bilateral Secured at: 22 cm Tube secured with: Tape Dental Injury: Teeth and Oropharynx as per pre-operative assessment

## 2019-12-05 NOTE — ED Provider Notes (Signed)
Nursing notes and vitals signs, including pulse oximetry, reviewed.  Summary of this visit's results, reviewed by myself:  EKG:  EKG Interpretation  Date/Time:    Ventricular Rate:    PR Interval:    QRS Duration:   QT Interval:    QTC Calculation:   R Axis:     Text Interpretation:         Labs:  Results for orders placed or performed during the hospital encounter of 12/04/19 (from the past 24 hour(s))  Lactic acid, plasma     Status: None   Collection Time: 12/04/19  9:05 PM  Result Value Ref Range   Lactic Acid, Venous 1.1 0.5 - 1.9 mmol/L  Culture, blood (Routine x 2)     Status: None (Preliminary result)   Collection Time: 12/04/19  9:05 PM   Specimen: BLOOD  Result Value Ref Range   Specimen Description      BLOOD RIGHT ANTECUBITAL Performed at [facility], [place] [address] [place], [place] [address]    Special Requests      BOTTLES DRAWN AEROBIC AND ANAEROBIC Blood Culture adequate volume Performed at [facility] [address], [place], [place] [address]    Culture PENDING    Report Status PENDING   Culture, blood (Routine x 2)     Status: None (Preliminary result)   Collection Time: 12/04/19  9:06 PM   Specimen: BLOOD  Result Value Ref Range   Specimen Description      BLOOD LEFT ANTECUBITAL Performed at [facility], [place] [address], [place], [place] [address]    Special Requests      BOTTLES DRAWN AEROBIC AND ANAEROBIC Blood Culture adequate volume Performed at [facility] [address], [place], [place] [address]    Culture PENDING    Report Status PENDING   I-Stat beta hCG blood, ED     Status: Abnormal   Collection Time: 12/04/19  9:11 PM  Result Value Ref Range   I-stat hCG, quantitative 142.0 (H) <5 mIU/mL   Comment 3          Comprehensive metabolic panel     Status: Abnormal   Collection Time: 12/04/19  9:13 PM  Result Value Ref Range   Sodium 136 135 - 145 mmol/L   Potassium 3.7 3.5  - 5.1 mmol/L   Chloride 102 98 - 111 mmol/L   CO2 23 22 - 32 mmol/L   Glucose, Bld 102 (H) 70 - 99 mg/dL   BUN 8 6 - 20 mg/dL   Creatinine, Ser 0.62 0.44 - 1.00 mg/dL   Calcium 9.1 8.9 - 10.3 mg/dL   Total Protein 7.5 6.5 - 8.1 g/dL   Albumin 3.7 3.5 - 5.0 g/dL   AST 18 15 - 41 U/L   ALT 16 0 - 44 U/L   Alkaline Phosphatase 87 38 - 126 U/L   Total Bilirubin 0.2 (L) 0.3 - 1.2 mg/dL   GFR calc non Af Amer >60 >60 mL/min   GFR calc Af Amer >60 >60 mL/min   Anion gap 11 5 - 15  CBC with Differential     Status: Abnormal   Collection Time: 12/04/19  9:13 PM  Result Value Ref Range   WBC 8.9 4.0 - 10.5 K/uL   RBC 3.11 (L) 3.87 - 5.11 MIL/uL   Hemoglobin 9.9 (L) 12.0 - 15.0 g/dL   HCT 29.6 (L) 36.0 - 46.0 %   MCV 95.2 80.0 - 100.0  fL   MCH 31.8 26.0 - 34.0 pg   MCHC 33.4 30.0 - 36.0 g/dL   RDW 13.9 11.5 - 15.5 %   Platelets 205 150 - 400 K/uL   nRBC 0.0 0.0 - 0.2 %   Neutrophils Relative % 81 %   Neutro Abs 7.2 1.7 - 7.7 K/uL   Lymphocytes Relative 11 %   Lymphs Abs 1.0 0.7 - 4.0 K/uL   Monocytes Relative 7 %   Monocytes Absolute 0.6 0.1 - 1.0 K/uL   Eosinophils Relative 0 %   Eosinophils Absolute 0.0 0.0 - 0.5 K/uL   Basophils Relative 0 %   Basophils Absolute 0.0 0.0 - 0.1 K/uL   Immature Granulocytes 1 %   Abs Immature Granulocytes 0.04 0.00 - 0.07 K/uL  Protime-INR     Status: None   Collection Time: 12/04/19  9:13 PM  Result Value Ref Range   Prothrombin Time 12.2 11.4 - 15.2 seconds   INR 0.9 0.8 - 1.2  Urinalysis, Routine w reflex microscopic     Status: Abnormal   Collection Time: 12/04/19 10:34 PM  Result Value Ref Range   Color, Urine STRAW (A) YELLOW   APPearance CLEAR CLEAR   Specific Gravity, Urine 1.021 1.005 - 1.030   pH 6.0 5.0 - 8.0   Glucose, UA NEGATIVE NEGATIVE mg/dL   Hgb urine dipstick SMALL (A) NEGATIVE   Bilirubin Urine NEGATIVE NEGATIVE   Ketones, ur NEGATIVE NEGATIVE mg/dL   Protein, ur NEGATIVE NEGATIVE mg/dL   Nitrite NEGATIVE NEGATIVE    Leukocytes,Ua TRACE (A) NEGATIVE   WBC, UA 0-5 0 - 5 WBC/hpf   Bacteria, UA NONE SEEN NONE SEEN  POC SARS Coronavirus 2 Ag-ED - Nasal Swab (BD Veritor Kit)     Status: None   Collection Time: 12/04/19 11:57 PM  Result Value Ref Range   SARS Coronavirus 2 Ag NEGATIVE NEGATIVE  CSF cell count with differential collection tube #: 1     Status: Abnormal   Collection Time: 12/05/19  1:02 AM  Result Value Ref Range   Tube # 1    Color, CSF COLORLESS COLORLESS   Appearance, CSF CLEAR CLEAR   Supernatant NOT INDICATED    RBC Count, CSF 52 (H) 0 /cu mm   WBC, CSF 0 0 - 5 /cu mm   Other Cells, CSF TOO FEW TO COUNT, SMEAR AVAILABLE FOR REVIEW   CSF cell count with differential collection tube #: 4     Status: Abnormal   Collection Time: 12/05/19  1:02 AM  Result Value Ref Range   Tube # 4    Color, CSF COLORLESS COLORLESS   Appearance, CSF CLEAR CLEAR   Supernatant NOT INDICATED    RBC Count, CSF 3 (H) 0 /cu mm   WBC, CSF 0 0 - 5 /cu mm   Other Cells, CSF TOO FEW TO COUNT, SMEAR AVAILABLE FOR REVIEW   CSF culture     Status: None (Preliminary result)   Collection Time: 12/05/19  1:02 AM   Specimen: Back; Cerebrospinal Fluid  Result Value Ref Range   Specimen Description BACK    Special Requests Normal    Gram Stain      NO WBC SEEN NO ORGANISMS SEEN Gram Stain Report Called to,Read Back By and Verified With: BROOKS,B @ 1610 ON [id number] BY POTEAT,S Performed at [facility], [place] [address] [place], [place] [address]    Culture PENDING    Report Status PENDING   Glucose, CSF  Status: None   Collection Time: 12/05/19  1:03 AM  Result Value Ref Range   Glucose, CSF 53 40 - 70 mg/dL  Protein, CSF     Status: Abnormal   Collection Time: 12/05/19  1:03 AM  Result Value Ref Range   Total  Protein, CSF 11 (L) 15 - 45 mg/dL  Wet prep, genital     Status: Abnormal   Collection Time: 12/05/19  2:09 AM   Specimen: Vaginal; Genital  Result Value Ref Range   Yeast Wet  Prep HPF POC NONE SEEN NONE SEEN   Trich, Wet Prep NONE SEEN NONE SEEN   Clue Cells Wet Prep HPF POC PRESENT (A) NONE SEEN   WBC, Wet Prep HPF POC MANY (A) NONE SEEN   Sperm NONE SEEN     Imaging Studies: DG Chest 2 View  Result Date: 12/04/2019 CLINICAL DATA:  Headaches.  Assess for sepsis EXAM: CHEST - 2 VIEW COMPARISON:  March 15, 2019 FINDINGS: The heart size and mediastinal contours are within normal limits. Both lungs are clear. The visualized skeletal structures are unremarkable. IMPRESSION: No active cardiopulmonary disease. Electronically Signed   By: Abelardo Diesel M.D.   On: 12/04/2019 21:31   US OB Comp < 14 Wks  Result Date: 12/05/2019 CLINICAL DATA:  Status post abortion with concern for retained products of conception. EXAM: OBSTETRIC <14 WK [place] AND TRANSVAGINAL OB US TECHNIQUE: Both transabdominal and transvaginal ultrasound examinations were performed for complete evaluation of the gestation as well as the maternal uterus, adnexal regions, and pelvic cul-de-sac. Transvaginal technique was performed to assess early pregnancy. COMPARISON:  None. FINDINGS: Intrauterine gestational sac: None Yolk sac:  Not Visualized. Embryo:  Not Visualized. Cardiac Activity: Not Visualized. Subchorionic hemorrhage:  None visualized. Maternal uterus/adnexae: The right ovary measures 2.8 x 2.7 x 2 cm. The left ovary measures 2.7 x 1.9 x 1.6 cm. At the endometrial fundus, there is a heterogeneous area measuring approximately 3.7 x 3.3 x 2.7 cm. There are cystic and solid-appearing areas in this location. There is hyperemia there is a trace amount of free fluid in the patient's pelvis. IMPRESSION: 1. No IUP identified. 2. Thickened heterogeneous appearance of the endometrium at the fundus raises concern for retained products of conception. 3. Trace amount of free fluid in the patient's pelvis. Electronically Signed   By: Constance Holster M.D.   On: 12/05/2019 01:48   US OB Transvaginal  Result Date:  12/05/2019 CLINICAL DATA:  Status post abortion with concern for retained products of conception. EXAM: OBSTETRIC <14 WK [place] AND TRANSVAGINAL OB US TECHNIQUE: Both transabdominal and transvaginal ultrasound examinations were performed for complete evaluation of the gestation as well as the maternal uterus, adnexal regions, and pelvic cul-de-sac. Transvaginal technique was performed to assess early pregnancy. COMPARISON:  None. FINDINGS: Intrauterine gestational sac: None Yolk sac:  Not Visualized. Embryo:  Not Visualized. Cardiac Activity: Not Visualized. Subchorionic hemorrhage:  None visualized. Maternal uterus/adnexae: The right ovary measures 2.8 x 2.7 x 2 cm. The left ovary measures 2.7 x 1.9 x 1.6 cm. At the endometrial fundus, there is a heterogeneous area measuring approximately 3.7 x 3.3 x 2.7 cm. There are cystic and solid-appearing areas in this location. There is hyperemia there is a trace amount of free fluid in the patient's pelvis. IMPRESSION: 1. No IUP identified. 2. Thickened heterogeneous appearance of the endometrium at the fundus raises concern for retained products of conception. 3. Trace amount of free fluid in the patient's pelvis. Electronically Signed  By: Constance Holster M.D.   On: 12/05/2019 01:48   CT ABDOMEN PELVIS W CONTRAST  Result Date: 12/04/2019 CLINICAL DATA:  History of recent medical induced abortion with fevers EXAM: CT ABDOMEN AND PELVIS WITH CONTRAST TECHNIQUE: Multidetector CT imaging of the abdomen and pelvis was performed using the standard protocol following bolus administration of intravenous contrast. CONTRAST:  48m OMNIPAQUE IOHEXOL 350 MG/ML SOLN COMPARISON:  02/21/2019 FINDINGS: Lower chest: No acute abnormality. Hepatobiliary: No focal liver abnormality is seen. No gallstones, gallbladder wall thickening, or biliary dilatation. Pancreas: Unremarkable. No pancreatic ductal dilatation or surrounding inflammatory changes. Spleen: Normal in size without focal  abnormality. Adrenals/Urinary Tract: The adrenal glands are within normal limits. Kidneys are well visualized bilaterally. No renal calculi or obstructive changes are seen. Bladder is well distended. Stomach/Bowel: No obstructive or inflammatory changes of the colon are seen. The appendix is air filled. No small bowel abnormality is seen. Vascular/Lymphatic: No significant vascular findings are present. No enlarged abdominal or pelvic lymph nodes. Reproductive: No adnexal mass is noted. The uterus is well visualized. There is enhancing and hypodense tissue identified within the uterine cavity. Given the patient's clinical history these changes likely represent retained products of conception. No air is noted within the endometrial canal to suggest definitive abscess. Other: No abdominal wall hernia or abnormality. No abdominopelvic ascites. Musculoskeletal: No acute or significant osseous findings. IMPRESSION: Enhancing tissue within the endometrial canal highly suspicious for retained products of conception. No air is noted to suggest acute abscess. No other focal abnormality is noted. Critical Value/emergent results were called by telephone at the time of interpretation on 12/04/2019 at 11:33 pm to Dr. MOctaviano Glow, who verbally acknowledged these results. Electronically Signed   By: MInez CatalinaM.D.   On: 12/04/2019 23:33   CT VENOGRAM HEAD  Result Date: 12/04/2019 CLINICAL DATA:  Dural venous sinus thrombosis suspected Persistent daily morning headache after induced abortion, evaluate for thrombosis EXAM: CT VENOGRAM HEAD TECHNIQUE: Noncontrast CT images of the head were obtained in the axial plane and re-formatted to coronal and sagittal projections without intravenous contrast. Vena graphic phase images of the brain were obtained following the administration of intravenous contrast with multiplanar and MIP reformats. CONTRAST:  770mOMNIPAQUE IOHEXOL 350 MG/ML SOLN COMPARISON:  03/15/2019 FINDINGS: CT  HEAD FINDINGS Brain: There is no mass, hemorrhage or extra-axial collection. The size and configuration of the ventricles and extra-axial CSF spaces are normal. The brain parenchyma is normal, without acute or chronic infarction. Vascular: No abnormal hyperdensity of the major intracranial arteries or dural venous sinuses. No intracranial atherosclerosis. Skull: The visualized skull base, calvarium and extracranial soft tissues are normal. Sinuses/Orbits: No fluid levels or advanced mucosal thickening of the visualized paranasal sinuses. No mastoid or middle ear effusion. The orbits are normal. CTV HEAD FINDINGS Superior sagittal sinus: Normal. Straight sinus: Normal. Inferior sagittal sinus, vein of Galen and internal cerebral veins: Normal. Transverse sinuses: Normal. Sigmoid sinuses: Normal. Visualized jugular veins: Normal. MIPs confirm findings. IMPRESSION: 1. No dural venous sinus thrombosis. 2. Normal head CT. Electronically Signed   By: KeUlyses Jarred.D.   On: 12/04/2019 23:21   3:27 AM No evidence of meningitis on Gram stain, no white cells or organisms seen.  Cell counts pending.   4:48 AM CSF cell counts not consistent with meningitis or intracranial hemorrhage.  Patient is scheduled for D&C this morning at 10 AM.  Patient refused confirmatory COVID-19 test.     MoShanon RosserMD 12/05/19 [phone]

## 2019-12-05 NOTE — Consult Note (Signed)
OBSTETRICS AND GYNECOLOGY ATTENDING TELEPHONE CONSULT NOTE  Consult Date: 12/05/2019  Consult Time:  0055  Reason for Consult: Fevers, headaches, retained products of conception after medical abortion over a month ago Consulting Physician: Dr. Octaviano Glow, [facility] ED physician   Assessment: Principal Problem:   Fever of unknown origin Active Problems:   Retained products of conception following abortion  Plan: The absence of normal WBC, no abdominal/pelvic pain,  and non-concerning vaginal discharge as reported on exam by Dr. Langston Masker makes the diagnosis of septic abortion less likely. Given her fevers, severe headaches, photophobia, she likely has another source causing these symptoms.  Meningitis evaluation pending, also could have a viral syndrome.  COVID antigen test was negative, confirmatory test pending.  Patient did receive one dose of Vancomycin, Rocephin and Zosyn while undergoing evaluation.   However, ultrasound findings are concerning for retained products of conception, persistent over a month after medical abortion. Surgical management is indicated.  As such, she will be scheduled as an add-on for tomorrow morning for Dilation and Evacuation (D&E), possibly under ultrasound guidance at [facility]. Preoperative orders signed and held.  Talked to charge RN at [facility] [phone]), the procedure is tentatively scheduled for the free time slot around 930 at [place] with my oncoming partner, Dr. Mora Bellman.  Dr. Langston Masker was notified of this; advised to keep patient NPO.  The plan for now is for her to remain in ED awaiting results of meningitis evaluation and for further observation.  If symptoms are still concerning, she may need inpatient observation by the Hospitalist team.  Of note, there are no current beds at [facility], so unable to transfer her here at this time.    Please call for any further gynecologic or preoperative concerns.  Dr. Mora Bellman will meet with patient  prior to surgery and will determine the appropriate postoperative evaluation period and management.   Thank you for involving [place] in the care of this mutual patient.  Total consultation time including phone calls, secure Epic chats with ED provider, reviewing chart and documentation: 60 minutes   Verita Schneiders, MD, [facility], [facility] for [facility], [place] Phone: [phone] or [phone]    History of Present Illness: Stacy Peterson is a 28 y.o. [id number] with history of medication induced abortion at [redacted] weeks gestation done at [facility] one month ago. She presented to [facility] ED yesterday late night with report of fevers, chills, headaches.  Associated symptoms: photophobia, nausea and vomiting.  Also reported abnormal vaginal discharge and passage of products of conception after her medication induced abortion.  In the ED, she was noted to be febrile to 101.3 F with tachycardia as high as 150s and hypotension was low as 90s/50s.  I-stat HCG was 142, WBC was 8.9, Hgb 9.9.  Lactic acid 1.1.  No abdominal or pelvic tenderness reported by patient or noted on exam, Dr. Langston Masker reported seeing physiologic appearing vaginal discharge and no cervical motion or uterine tenderness.  She underwent a CT scan of abdomen and pelvis showed "enhancing and hypodense tissue identified within the uterine cavity. Given the patient's clinical history these changes likely represent retained products ofconception. No air is noted within the endometrial canal to suggest definitive abscess", but the radiologist remarked that this was not an optimal study.  Pelvic ultrasound was ordered which showed "At the endometrial fundus, there is a heterogeneous area measuring approximately 3.7 x 3.3 x 2.7  cm. There are cystic and solid-appearing areas in this location. There is hyperemia.Marland KitchenMarland KitchenThickened heterogeneous appearance of the endometrium at the  fundus raises concern for retained products of conception".    Of note, patient also underwent a lumber puncture for evaluation for meningitis given her headaches and photophobia, results are still pending. COVID antigen test was negative, confirmatory test pending.  Patient did receive one dose of Vancomycin, Rocephin and Zosyn while undergoing evaluation.  No other etiology has been identified for her symptoms.  Our service was consulted for management of the retained products of conception.   Past Medical History:  Diagnosis Date   Bipolar 1 disorder ([place])    Chlamydia    Depression    Preterm labor    Schizophrenia ([place])    Shingles    Trichomonas vaginitis    Past Surgical History:  Procedure Laterality Date   KNEE SURGERY     OB History  Gravida Para Term Preterm AB Living  '5 4 3 1 1 3  ' SAB TAB Ectopic Multiple Live Births  0 0   0 3    # Outcome Date GA Lbr Len/2nd Weight Sex Delivery Anes PTL Lv  5 AB 11/05/19 [redacted]w[redacted]d   TAB        Birth Comments: Medication abortion  4 Term 06/24/14 419w2d0:47 / 00:05 3265 g F Vag-Spont EPI  LIV  3 Preterm 2013 2147w0d Vag-Spont   FD  2 Term 11/03/11 40w8w0d05 g M Vag-Spont EPI  LIV  1 Term 12/16/08 [id number] g F Vag-Spont EPI  LIV    No current facility-administered medications on file prior to encounter.   No current outpatient medications on file prior to encounter.   No Known Allergies  Social History:   reports that she has quit smoking. Her smoking use included cigarettes. She smoked 0.25 packs per day. She has never used smokeless tobacco. She reports current drug use. Drug: Marijuana. She reports that she does not drink alcohol. Family History  Problem Relation Age of Onset   Diabetes Paternal Grandmother    Diabetes Paternal Grandfather     Review of Systems: Pertinent items noted in HPI and remainder of comprehensive ROS otherwise negative.  PHYSICAL EXAM: Patient Vitals for the past 24 hrs:  BP Temp Temp src  Pulse Resp SpO2 Height Weight  12/05/19 0200 110/76 -- -- 60 (!) 25 100 % -- --  12/05/19 0130 109/69 -- -- 72 (!) 32 100 % -- --  12/05/19 0101 99/72 -- -- 64 18 100 % -- --  12/05/19 0000 (!) 100/56 -- -- 62 20 99 % -- --  12/04/19 2330 (!) 97/51 -- -- 66 20 100 % -- --  12/04/19 2323 (!) 100/54 -- -- 75 (!) 21 98 % -- --  12/04/19 2304 (!) 91/51 -- -- 73 20 99 % -- --  12/04/19 2230 107/79 -- -- (!) 106 (!) 27 100 % -- --  12/04/19 2210 -- -- -- -- 14 -- -- --  12/04/19 2209 113/69 -- -- 89 14 98 % -- --  12/04/19 2203 -- -- -- -- -- -- '5\' 5"'  (1.651 m) 65.8 kg  12/04/19 2106 -- -- -- (!) 137 -- -- -- --  12/04/19 2050 (!) 160/112 (!) 101.3 F (38.5 C) Oral (!) 155 (!) 26 100 % -- --  Physical exam unable to be done as not face-to-face with patient   Labs: Results for orders placed or performed  during the hospital encounter of 12/04/19 (from the past 336 hour(s))  Culture, blood (Routine x 2)   Collection Time: 12/04/19  9:05 PM   Specimen: BLOOD  Result Value Ref Range   Specimen Description      BLOOD RIGHT ANTECUBITAL Performed at [facility], [place] [address], [place], [place] [address]    Special Requests      BOTTLES DRAWN AEROBIC AND ANAEROBIC Blood Culture adequate volume Performed at [facility] [address], [place], [place] [address]    Culture PENDING    Report Status PENDING   Lactic acid, plasma   Collection Time: 12/04/19  9:05 PM  Result Value Ref Range   Lactic Acid, Venous 1.1 0.5 - 1.9 mmol/L  Culture, blood (Routine x 2)   Collection Time: 12/04/19  9:06 PM   Specimen: BLOOD  Result Value Ref Range   Specimen Description      BLOOD LEFT ANTECUBITAL Performed at [facility], [place] [address], [place], [place] [address]    Special Requests      BOTTLES DRAWN AEROBIC AND ANAEROBIC Blood Culture adequate volume Performed at [facility] [address] [place], [place] [address]    Culture PENDING     Report Status PENDING   I-Stat beta hCG blood, ED   Collection Time: 12/04/19  9:11 PM  Result Value Ref Range   I-stat hCG, quantitative 142.0 (H) <5 mIU/mL   Comment 3          Comprehensive metabolic panel   Collection Time: 12/04/19  9:13 PM  Result Value Ref Range   Sodium 136 135 - 145 mmol/L   Potassium 3.7 3.5 - 5.1 mmol/L   Chloride 102 98 - 111 mmol/L   CO2 23 22 - 32 mmol/L   Glucose, Bld 102 (H) 70 - 99 mg/dL   BUN 8 6 - 20 mg/dL   Creatinine, Ser 0.62 0.44 - 1.00 mg/dL   Calcium 9.1 8.9 - 10.3 mg/dL   Total Protein 7.5 6.5 - 8.1 g/dL   Albumin 3.7 3.5 - 5.0 g/dL   AST 18 15 - 41 U/L   ALT 16 0 - 44 U/L   Alkaline Phosphatase 87 38 - 126 U/L   Total Bilirubin 0.2 (L) 0.3 - 1.2 mg/dL   GFR calc non Af Amer >60 >60 mL/min   GFR calc Af Amer >60 >60 mL/min   Anion gap 11 5 - 15  CBC with Differential   Collection Time: 12/04/19  9:13 PM  Result Value Ref Range   WBC 8.9 4.0 - 10.5 K/uL   RBC 3.11 (L) 3.87 - 5.11 MIL/uL   Hemoglobin 9.9 (L) 12.0 - 15.0 g/dL   HCT 29.6 (L) 36.0 - 46.0 %   MCV 95.2 80.0 - 100.0 fL   MCH 31.8 26.0 - 34.0 pg   MCHC 33.4 30.0 - 36.0 g/dL   RDW 13.9 11.5 - 15.5 %   Platelets 205 150 - 400 K/uL   nRBC 0.0 0.0 - 0.2 %   Neutrophils Relative % 81 %   Neutro Abs 7.2 1.7 - 7.7 K/uL   Lymphocytes Relative 11 %   Lymphs Abs 1.0 0.7 - 4.0 K/uL   Monocytes Relative 7 %   Monocytes Absolute 0.6 0.1 - 1.0 K/uL   Eosinophils Relative 0 %   Eosinophils Absolute 0.0 0.0 - 0.5 K/uL   Basophils Relative 0 %   Basophils Absolute 0.0 0.0 -  0.1 K/uL   Immature Granulocytes 1 %   Abs Immature Granulocytes 0.04 0.00 - 0.07 K/uL  Protime-INR   Collection Time: 12/04/19  9:13 PM  Result Value Ref Range   Prothrombin Time 12.2 11.4 - 15.2 seconds   INR 0.9 0.8 - 1.2  Urinalysis, Routine w reflex microscopic   Collection Time: 12/04/19 10:34 PM  Result Value Ref Range   Color, Urine STRAW (A) YELLOW   APPearance CLEAR CLEAR   Specific  Gravity, Urine 1.021 1.005 - 1.030   pH 6.0 5.0 - 8.0   Glucose, UA NEGATIVE NEGATIVE mg/dL   Hgb urine dipstick SMALL (A) NEGATIVE   Bilirubin Urine NEGATIVE NEGATIVE   Ketones, ur NEGATIVE NEGATIVE mg/dL   Protein, ur NEGATIVE NEGATIVE mg/dL   Nitrite NEGATIVE NEGATIVE   Leukocytes,Ua TRACE (A) NEGATIVE   WBC, UA 0-5 0 - 5 WBC/hpf   Bacteria, UA NONE SEEN NONE SEEN  POC SARS Coronavirus 2 Ag-ED - Nasal Swab (BD Veritor Kit)   Collection Time: 12/04/19 11:57 PM  Result Value Ref Range   SARS Coronavirus 2 Ag NEGATIVE NEGATIVE    Imaging Studies: DG Chest 2 View  Result Date: 12/04/2019 CLINICAL DATA:  Headaches.  Assess for sepsis EXAM: CHEST - 2 VIEW COMPARISON:  March 15, 2019 FINDINGS: The heart size and mediastinal contours are within normal limits. Both lungs are clear. The visualized skeletal structures are unremarkable. IMPRESSION: No active cardiopulmonary disease. Electronically Signed   By: Abelardo Diesel M.D.   On: 12/04/2019 21:31   US OB Comp < 14 Wks  Result Date: 12/05/2019 CLINICAL DATA:  Status post abortion with concern for retained products of conception. EXAM: OBSTETRIC <14 WK [place] AND TRANSVAGINAL OB US TECHNIQUE: Both transabdominal and transvaginal ultrasound examinations were performed for complete evaluation of the gestation as well as the maternal uterus, adnexal regions, and pelvic cul-de-sac. Transvaginal technique was performed to assess early pregnancy. COMPARISON:  None. FINDINGS: Intrauterine gestational sac: None Yolk sac:  Not Visualized. Embryo:  Not Visualized. Cardiac Activity: Not Visualized. Subchorionic hemorrhage:  None visualized. Maternal uterus/adnexae: The right ovary measures 2.8 x 2.7 x 2 cm. The left ovary measures 2.7 x 1.9 x 1.6 cm. At the endometrial fundus, there is a heterogeneous area measuring approximately 3.7 x 3.3 x 2.7 cm. There are cystic and solid-appearing areas in this location. There is hyperemia there is a trace amount of free  fluid in the patient's pelvis. IMPRESSION: 1. No IUP identified. 2. Thickened heterogeneous appearance of the endometrium at the fundus raises concern for retained products of conception. 3. Trace amount of free fluid in the patient's pelvis. Electronically Signed   By: Constance Holster M.D.   On: 12/05/2019 01:48   US OB Transvaginal  Result Date: 12/05/2019 CLINICAL DATA:  Status post abortion with concern for retained products of conception. EXAM: OBSTETRIC <14 WK [place] AND TRANSVAGINAL OB US TECHNIQUE: Both transabdominal and transvaginal ultrasound examinations were performed for complete evaluation of the gestation as well as the maternal uterus, adnexal regions, and pelvic cul-de-sac. Transvaginal technique was performed to assess early pregnancy. COMPARISON:  None. FINDINGS: Intrauterine gestational sac: None Yolk sac:  Not Visualized. Embryo:  Not Visualized. Cardiac Activity: Not Visualized. Subchorionic hemorrhage:  None visualized. Maternal uterus/adnexae: The right ovary measures 2.8 x 2.7 x 2 cm. The left ovary measures 2.7 x 1.9 x 1.6 cm. At the endometrial fundus, there is a heterogeneous area measuring approximately 3.7 x 3.3 x 2.7 cm. There are cystic and  solid-appearing areas in this location. There is hyperemia there is a trace amount of free fluid in the patient's pelvis. IMPRESSION: 1. No IUP identified. 2. Thickened heterogeneous appearance of the endometrium at the fundus raises concern for retained products of conception. 3. Trace amount of free fluid in the patient's pelvis. Electronically Signed   By: Constance Holster M.D.   On: 12/05/2019 01:48   CT ABDOMEN PELVIS W CONTRAST  Result Date: 12/04/2019 CLINICAL DATA:  History of recent medical induced abortion with fevers EXAM: CT ABDOMEN AND PELVIS WITH CONTRAST TECHNIQUE: Multidetector CT imaging of the abdomen and pelvis was performed using the standard protocol following bolus administration of intravenous contrast. CONTRAST:   85m OMNIPAQUE IOHEXOL 350 MG/ML SOLN COMPARISON:  02/21/2019 FINDINGS: Lower chest: No acute abnormality. Hepatobiliary: No focal liver abnormality is seen. No gallstones, gallbladder wall thickening, or biliary dilatation. Pancreas: Unremarkable. No pancreatic ductal dilatation or surrounding inflammatory changes. Spleen: Normal in size without focal abnormality. Adrenals/Urinary Tract: The adrenal glands are within normal limits. Kidneys are well visualized bilaterally. No renal calculi or obstructive changes are seen. Bladder is well distended. Stomach/Bowel: No obstructive or inflammatory changes of the colon are seen. The appendix is air filled. No small bowel abnormality is seen. Vascular/Lymphatic: No significant vascular findings are present. No enlarged abdominal or pelvic lymph nodes. Reproductive: No adnexal mass is noted. The uterus is well visualized. There is enhancing and hypodense tissue identified within the uterine cavity. Given the patient's clinical history these changes likely represent retained products of conception. No air is noted within the endometrial canal to suggest definitive abscess. Other: No abdominal wall hernia or abnormality. No abdominopelvic ascites. Musculoskeletal: No acute or significant osseous findings. IMPRESSION: Enhancing tissue within the endometrial canal highly suspicious for retained products of conception. No air is noted to suggest acute abscess. No other focal abnormality is noted. Critical Value/emergent results were called by telephone at the time of interpretation on 12/04/2019 at 11:33 pm to Dr. MOctaviano Glow, who verbally acknowledged these results. Electronically Signed   By: MInez CatalinaM.D.   On: 12/04/2019 23:33   CT VENOGRAM HEAD  Result Date: 12/04/2019 CLINICAL DATA:  Dural venous sinus thrombosis suspected Persistent daily morning headache after induced abortion, evaluate for thrombosis EXAM: CT VENOGRAM HEAD TECHNIQUE: Noncontrast CT images  of the head were obtained in the axial plane and re-formatted to coronal and sagittal projections without intravenous contrast. Vena graphic phase images of the brain were obtained following the administration of intravenous contrast with multiplanar and MIP reformats. CONTRAST:  765mOMNIPAQUE IOHEXOL 350 MG/ML SOLN COMPARISON:  03/15/2019 FINDINGS: CT HEAD FINDINGS Brain: There is no mass, hemorrhage or extra-axial collection. The size and configuration of the ventricles and extra-axial CSF spaces are normal. The brain parenchyma is normal, without acute or chronic infarction. Vascular: No abnormal hyperdensity of the major intracranial arteries or dural venous sinuses. No intracranial atherosclerosis. Skull: The visualized skull base, calvarium and extracranial soft tissues are normal. Sinuses/Orbits: No fluid levels or advanced mucosal thickening of the visualized paranasal sinuses. No mastoid or middle ear effusion. The orbits are normal. CTV HEAD FINDINGS Superior sagittal sinus: Normal. Straight sinus: Normal. Inferior sagittal sinus, vein of Galen and internal cerebral veins: Normal. Transverse sinuses: Normal. Sigmoid sinuses: Normal. Visualized jugular veins: Normal. MIPs confirm findings. IMPRESSION: 1. No dural venous sinus thrombosis. 2. Normal head CT. Electronically Signed   By: KeUlyses Jarred.D.   On: 12/04/2019 23:21

## 2019-12-05 NOTE — ED Notes (Signed)
Pt yelling out, When this RN went into the room, the pt is crying and stating "I just cant sleep. I need to sleep" Pt informed that she has been given multiple rounds of medications.

## 2019-12-05 NOTE — Progress Notes (Signed)
Charge nurse called to bedside to assist with patient who was crying and being verbally abusive to nursing staff. Patient stating she was upset about "tissues being removed from body and not given back." Nursing attempted to explain protocol regarding surgical specimens but patient continued to yell and curse at nursing staff. Anesthesia MD and surgeon contacted by charge RN and made aware. Patient stated that she was leaving but would not say who was picking her up. Nursing staff assisted patient with dressing but patient removed her own IV at request. Discharge instructions were given and due to continued aggressiveness patient was escorted outside by nursing staff and AD along with security to await ride. AC was notified and came to assist. Patients' significant other arrived shortly after and picked her up.

## 2019-12-05 NOTE — ED Notes (Signed)
Pts belongings placed in pt belonging bag and pt cleansed with CHG wipes

## 2019-12-05 NOTE — H&P (Signed)
Stacy Peterson is an 28 y.o. female [phone] with recent medical abortion and radiologic findings concerning for retained products of conception who is here for a dilatation and evacuation. Patient evaluated in ED for fever, headache with photophobia. Work-up for meningitis was negative. Patient reports some persistent vaginal bleeding post termination but denies abdominal pain. She is also complaining of persistent HA which has been present for approximately a month prior to her medical abortion. Patient reports minimal relief with over the counter medication   Menstrual History: Patient's last menstrual period was 08/26/2019.    Past Medical History:  Diagnosis Date  . Bipolar 1 disorder ([place])   . Chlamydia   . Depression   . Preterm labor   . Schizophrenia ([place])   . Shingles   . Trichomonas vaginitis     Past Surgical History:  Procedure Laterality Date  . KNEE SURGERY      Family History  Problem Relation Age of Onset  . Diabetes Paternal Grandmother   . Diabetes Paternal Grandfather     Social History:  reports that she has quit smoking. Her smoking use included cigarettes. She smoked 0.25 packs per day. She has never used smokeless tobacco. She reports current drug use. Drug: Marijuana. She reports that she does not drink alcohol.  Allergies: No Known Allergies  No medications prior to admission.    Review of Systems See pertinent in HPI Blood pressure 104/71, pulse 73, temperature 98.7 F (37.1 C), temperature source Oral, resp. rate 20, height '5\' 5"'  (1.651 m), weight 65.8 kg, last menstrual period 08/26/2019, SpO2 100 %, unknown if currently breastfeeding. Physical Exam GENERAL: Well-developed, well-nourished female in no acute distress.  LUNGS: Clear to auscultation bilaterally.  HEART: Regular rate and rhythm. ABDOMEN: Soft, nontender, nondistended. No organomegaly. PELVIC: Deferred to OR EXTREMITIES: No cyanosis, clubbing, or edema, 2+ distal pulses.  Results  for orders placed or performed during the hospital encounter of 12/04/19 (from the past 24 hour(s))  Lactic acid, plasma     Status: None   Collection Time: 12/04/19  9:05 PM  Result Value Ref Range   Lactic Acid, Venous 1.1 0.5 - 1.9 mmol/L  Culture, blood (Routine x 2)     Status: None (Preliminary result)   Collection Time: 12/04/19  9:05 PM   Specimen: BLOOD  Result Value Ref Range   Specimen Description      BLOOD RIGHT ANTECUBITAL Performed at [facility], [place] [address], [place], [place] [address]    Special Requests      BOTTLES DRAWN AEROBIC AND ANAEROBIC Blood Culture adequate volume Performed at [facility] [address], [place], [place] [address]    Culture PENDING    Report Status PENDING   Culture, blood (Routine x 2)     Status: None (Preliminary result)   Collection Time: 12/04/19  9:06 PM   Specimen: BLOOD  Result Value Ref Range   Specimen Description      BLOOD LEFT ANTECUBITAL Performed at [facility], [place] [address] [place], [place] [address]    Special Requests      BOTTLES DRAWN AEROBIC AND ANAEROBIC Blood Culture adequate volume Performed at [facility] [place] [address] [place], [place] [address]    Culture PENDING    Report Status PENDING   I-Stat beta hCG blood, ED     Status: Abnormal   Collection Time: 12/04/19  9:11 PM  Result Value Ref Range   I-stat hCG, quantitative  142.0 (H) <5 mIU/mL   Comment 3          Comprehensive metabolic panel     Status: Abnormal   Collection Time: 12/04/19  9:13 PM  Result Value Ref Range   Sodium 136 135 - 145 mmol/L   Potassium 3.7 3.5 - 5.1 mmol/L   Chloride 102 98 - 111 mmol/L   CO2 23 22 - 32 mmol/L   Glucose, Bld 102 (H) 70 - 99 mg/dL   BUN 8 6 - 20 mg/dL   Creatinine, Ser 0.62 0.44 - 1.00 mg/dL   Calcium 9.1 8.9 - 10.3 mg/dL   Total Protein 7.5 6.5 - 8.1 g/dL   Albumin 3.7 3.5 - 5.0 g/dL   AST 18 15 - 41 U/L   ALT 16 0 - 44 U/L   Alkaline  Phosphatase 87 38 - 126 U/L   Total Bilirubin 0.2 (L) 0.3 - 1.2 mg/dL   GFR calc non Af Amer >60 >60 mL/min   GFR calc Af Amer >60 >60 mL/min   Anion gap 11 5 - 15  CBC with Differential     Status: Abnormal   Collection Time: 12/04/19  9:13 PM  Result Value Ref Range   WBC 8.9 4.0 - 10.5 K/uL   RBC 3.11 (L) 3.87 - 5.11 MIL/uL   Hemoglobin 9.9 (L) 12.0 - 15.0 g/dL   HCT 29.6 (L) 36.0 - 46.0 %   MCV 95.2 80.0 - 100.0 fL   MCH 31.8 26.0 - 34.0 pg   MCHC 33.4 30.0 - 36.0 g/dL   RDW 13.9 11.5 - 15.5 %   Platelets 205 150 - 400 K/uL   nRBC 0.0 0.0 - 0.2 %   Neutrophils Relative % 81 %   Neutro Abs 7.2 1.7 - 7.7 K/uL   Lymphocytes Relative 11 %   Lymphs Abs 1.0 0.7 - 4.0 K/uL   Monocytes Relative 7 %   Monocytes Absolute 0.6 0.1 - 1.0 K/uL   Eosinophils Relative 0 %   Eosinophils Absolute 0.0 0.0 - 0.5 K/uL   Basophils Relative 0 %   Basophils Absolute 0.0 0.0 - 0.1 K/uL   Immature Granulocytes 1 %   Abs Immature Granulocytes 0.04 0.00 - 0.07 K/uL  Protime-INR     Status: None   Collection Time: 12/04/19  9:13 PM  Result Value Ref Range   Prothrombin Time 12.2 11.4 - 15.2 seconds   INR 0.9 0.8 - 1.2  Urinalysis, Routine w reflex microscopic     Status: Abnormal   Collection Time: 12/04/19 10:34 PM  Result Value Ref Range   Color, Urine STRAW (A) YELLOW   APPearance CLEAR CLEAR   Specific Gravity, Urine 1.021 1.005 - 1.030   pH 6.0 5.0 - 8.0   Glucose, UA NEGATIVE NEGATIVE mg/dL   Hgb urine dipstick SMALL (A) NEGATIVE   Bilirubin Urine NEGATIVE NEGATIVE   Ketones, ur NEGATIVE NEGATIVE mg/dL   Protein, ur NEGATIVE NEGATIVE mg/dL   Nitrite NEGATIVE NEGATIVE   Leukocytes,Ua TRACE (A) NEGATIVE   WBC, UA 0-5 0 - 5 WBC/hpf   Bacteria, UA NONE SEEN NONE SEEN  POC SARS Coronavirus 2 Ag-ED - Nasal Swab (BD Veritor Kit)     Status: None   Collection Time: 12/04/19 11:57 PM  Result Value Ref Range   SARS Coronavirus 2 Ag NEGATIVE NEGATIVE  CSF cell count with differential  collection tube #: 1     Status: Abnormal   Collection Time: 12/05/19  1:02 AM  Result Value Ref Range   Tube # 1    Color, CSF COLORLESS COLORLESS   Appearance, CSF CLEAR CLEAR   Supernatant NOT INDICATED    RBC Count, CSF 52 (H) 0 /cu mm   WBC, CSF 0 0 - 5 /cu mm   Other Cells, CSF TOO FEW TO COUNT, SMEAR AVAILABLE FOR REVIEW   CSF cell count with differential collection tube #: 4     Status: Abnormal   Collection Time: 12/05/19  1:02 AM  Result Value Ref Range   Tube # 4    Color, CSF COLORLESS COLORLESS   Appearance, CSF CLEAR CLEAR   Supernatant NOT INDICATED    RBC Count, CSF 3 (H) 0 /cu mm   WBC, CSF 0 0 - 5 /cu mm   Other Cells, CSF TOO FEW TO COUNT, SMEAR AVAILABLE FOR REVIEW   CSF culture     Status: None (Preliminary result)   Collection Time: 12/05/19  1:02 AM   Specimen: Back; Cerebrospinal Fluid  Result Value Ref Range   Specimen Description BACK    Special Requests Normal    Gram Stain      NO WBC SEEN NO ORGANISMS SEEN Gram Stain Report Called to,Read Back By and Verified With: BROOKS,B @ [id number] ON [id number] BY POTEAT,S Performed at [facility], [place] [address], [place], [place] [address]    Culture PENDING    Report Status PENDING   Glucose, CSF     Status: None   Collection Time: 12/05/19  1:03 AM  Result Value Ref Range   Glucose, CSF 53 40 - 70 mg/dL  Protein, CSF     Status: Abnormal   Collection Time: 12/05/19  1:03 AM  Result Value Ref Range   Total  Protein, CSF 11 (L) 15 - 45 mg/dL  Wet prep, genital     Status: Abnormal   Collection Time: 12/05/19  2:09 AM   Specimen: Vaginal; Genital  Result Value Ref Range   Yeast Wet Prep HPF POC NONE SEEN NONE SEEN   Trich, Wet Prep NONE SEEN NONE SEEN   Clue Cells Wet Prep HPF POC PRESENT (A) NONE SEEN   WBC, Wet Prep HPF POC MANY (A) NONE SEEN   Sperm NONE SEEN   Respiratory Panel by RT PCR (Flu A&B, Covid) - Nasopharyngeal Swab     Status: None   Collection Time: 12/05/19  7:20 AM    Specimen: Nasopharyngeal Swab  Result Value Ref Range   SARS Coronavirus 2 by RT PCR NEGATIVE NEGATIVE   Influenza A by PCR NEGATIVE NEGATIVE   Influenza B by PCR NEGATIVE NEGATIVE    DG Chest 2 View  Result Date: 12/04/2019 CLINICAL DATA:  Headaches.  Assess for sepsis EXAM: CHEST - 2 VIEW COMPARISON:  March 15, 2019 FINDINGS: The heart size and mediastinal contours are within normal limits. Both lungs are clear. The visualized skeletal structures are unremarkable. IMPRESSION: No active cardiopulmonary disease. Electronically Signed   By: Abelardo Diesel M.D.   On: 12/04/2019 21:31   US OB Comp < 14 Wks  Result Date: 12/05/2019 CLINICAL DATA:  Status post abortion with concern for retained products of conception. EXAM: OBSTETRIC <14 WK [place] AND TRANSVAGINAL OB US TECHNIQUE: Both transabdominal and transvaginal ultrasound examinations were performed for complete evaluation of the gestation as well as the maternal uterus, adnexal regions, and pelvic cul-de-sac. Transvaginal technique was performed to assess early pregnancy. COMPARISON:  None. FINDINGS: Intrauterine gestational sac: None Yolk sac:  Not Visualized. Embryo:  Not Visualized. Cardiac Activity: Not Visualized. Subchorionic hemorrhage:  None visualized. Maternal uterus/adnexae: The right ovary measures 2.8 x 2.7 x 2 cm. The left ovary measures 2.7 x 1.9 x 1.6 cm. At the endometrial fundus, there is a heterogeneous area measuring approximately 3.7 x 3.3 x 2.7 cm. There are cystic and solid-appearing areas in this location. There is hyperemia there is a trace amount of free fluid in the patient's pelvis. IMPRESSION: 1. No IUP identified. 2. Thickened heterogeneous appearance of the endometrium at the fundus raises concern for retained products of conception. 3. Trace amount of free fluid in the patient's pelvis. Electronically Signed   By: Constance Holster M.D.   On: 12/05/2019 01:48   US OB Transvaginal  Result Date: 12/05/2019 CLINICAL  DATA:  Status post abortion with concern for retained products of conception. EXAM: OBSTETRIC <14 WK [place] AND TRANSVAGINAL OB US TECHNIQUE: Both transabdominal and transvaginal ultrasound examinations were performed for complete evaluation of the gestation as well as the maternal uterus, adnexal regions, and pelvic cul-de-sac. Transvaginal technique was performed to assess early pregnancy. COMPARISON:  None. FINDINGS: Intrauterine gestational sac: None Yolk sac:  Not Visualized. Embryo:  Not Visualized. Cardiac Activity: Not Visualized. Subchorionic hemorrhage:  None visualized. Maternal uterus/adnexae: The right ovary measures 2.8 x 2.7 x 2 cm. The left ovary measures 2.7 x 1.9 x 1.6 cm. At the endometrial fundus, there is a heterogeneous area measuring approximately 3.7 x 3.3 x 2.7 cm. There are cystic and solid-appearing areas in this location. There is hyperemia there is a trace amount of free fluid in the patient's pelvis. IMPRESSION: 1. No IUP identified. 2. Thickened heterogeneous appearance of the endometrium at the fundus raises concern for retained products of conception. 3. Trace amount of free fluid in the patient's pelvis. Electronically Signed   By: Constance Holster M.D.   On: 12/05/2019 01:48   CT ABDOMEN PELVIS W CONTRAST  Result Date: 12/04/2019 CLINICAL DATA:  History of recent medical induced abortion with fevers EXAM: CT ABDOMEN AND PELVIS WITH CONTRAST TECHNIQUE: Multidetector CT imaging of the abdomen and pelvis was performed using the standard protocol following bolus administration of intravenous contrast. CONTRAST:  65m OMNIPAQUE IOHEXOL 350 MG/ML SOLN COMPARISON:  02/21/2019 FINDINGS: Lower chest: No acute abnormality. Hepatobiliary: No focal liver abnormality is seen. No gallstones, gallbladder wall thickening, or biliary dilatation. Pancreas: Unremarkable. No pancreatic ductal dilatation or surrounding inflammatory changes. Spleen: Normal in size without focal abnormality.  Adrenals/Urinary Tract: The adrenal glands are within normal limits. Kidneys are well visualized bilaterally. No renal calculi or obstructive changes are seen. Bladder is well distended. Stomach/Bowel: No obstructive or inflammatory changes of the colon are seen. The appendix is air filled. No small bowel abnormality is seen. Vascular/Lymphatic: No significant vascular findings are present. No enlarged abdominal or pelvic lymph nodes. Reproductive: No adnexal mass is noted. The uterus is well visualized. There is enhancing and hypodense tissue identified within the uterine cavity. Given the patient's clinical history these changes likely represent retained products of conception. No air is noted within the endometrial canal to suggest definitive abscess. Other: No abdominal wall hernia or abnormality. No abdominopelvic ascites. Musculoskeletal: No acute or significant osseous findings. IMPRESSION: Enhancing tissue within the endometrial canal highly suspicious for retained products of conception. No air is noted to suggest acute abscess. No other focal abnormality is noted. Critical Value/emergent results were called by telephone at the time of interpretation on 12/04/2019 at 11:33 pm to Dr. MOctaviano Glow,  who verbally acknowledged these results. Electronically Signed   By: Inez Catalina M.D.   On: 12/04/2019 23:33   CT VENOGRAM HEAD  Result Date: 12/04/2019 CLINICAL DATA:  Dural venous sinus thrombosis suspected Persistent daily morning headache after induced abortion, evaluate for thrombosis EXAM: CT VENOGRAM HEAD TECHNIQUE: Noncontrast CT images of the head were obtained in the axial plane and re-formatted to coronal and sagittal projections without intravenous contrast. Vena graphic phase images of the brain were obtained following the administration of intravenous contrast with multiplanar and MIP reformats. CONTRAST:  33m OMNIPAQUE IOHEXOL 350 MG/ML SOLN COMPARISON:  03/15/2019 FINDINGS: CT HEAD  FINDINGS Brain: There is no mass, hemorrhage or extra-axial collection. The size and configuration of the ventricles and extra-axial CSF spaces are normal. The brain parenchyma is normal, without acute or chronic infarction. Vascular: No abnormal hyperdensity of the major intracranial arteries or dural venous sinuses. No intracranial atherosclerosis. Skull: The visualized skull base, calvarium and extracranial soft tissues are normal. Sinuses/Orbits: No fluid levels or advanced mucosal thickening of the visualized paranasal sinuses. No mastoid or middle ear effusion. The orbits are normal. CTV HEAD FINDINGS Superior sagittal sinus: Normal. Straight sinus: Normal. Inferior sagittal sinus, vein of Galen and internal cerebral veins: Normal. Transverse sinuses: Normal. Sigmoid sinuses: Normal. Visualized jugular veins: Normal. MIPs confirm findings. IMPRESSION: 1. No dural venous sinus thrombosis. 2. Normal head CT. Electronically Signed   By: KUlyses JarredM.D.   On: 12/04/2019 23:21   Assessment/Plan: 28yo P3113 with retained POC and given normal WBC likely a viral illness - Discussed surgical evacuation of retained products of conception via D&E. Risks, benefits and alternatives were explained inclduing but not limited to risks of bleeding, infection, uterine perforation and damage to adjacent organs. Patient verbalized understanding and all questions were answered - Pending no complications, patient will be discharge home with plans to follow up in office in 2 weeks and is being referred to Neurology for her headaches  Cairo Lingenfelter 12/05/2019, 9:33 AM

## 2019-12-05 NOTE — Discharge Instructions (Signed)
Dilation and Curettage or Vacuum Curettage, Care After °This sheet gives you information about how to care for yourself after your procedure. Your health care provider may also give you more specific instructions. If you have problems or questions, contact your health care provider. °What can I expect after the procedure? °After your procedure, it is common to have: °· Mild pain or cramping. °· Some vaginal bleeding or spotting. °These may last for up to 2 weeks after your procedure. °Follow these instructions at home: °Activity ° °· Do not drive or use heavy machinery while taking prescription pain medicine. °· Avoid driving for the first 24 hours after your procedure. °· Take frequent, short walks, followed by rest periods, throughout the day. Ask your health care provider what activities are safe for you. After 1-2 days, you may be able to return to your normal activities. °· Do not lift anything heavier than 10 lb (4.5 kg) until your health care provider approves. °· For at least 2 weeks, or as long as told by your health care provider, do not: °? Douche. °? Use tampons. °? Have sexual intercourse. °General instructions ° °· Take over-the-counter and prescription medicines only as told by your health care provider. This is especially important if you take blood thinning medicine. °· Do not take baths, swim, or use a hot tub until your health care provider approves. Take showers instead of baths. °· Wear compression stockings as told by your health care provider. These stockings help to prevent blood clots and reduce swelling in your legs. °· It is your responsibility to get the results of your procedure. Ask your health care provider, or the department performing the procedure, when your results will be ready. °· Keep all follow-up visits as told by your health care provider. This is important. °Contact a health care provider if: °· You have severe cramps that get worse or that do not get better with  medicine. °· You have severe abdominal pain. °· You cannot drink fluids without vomiting. °· You develop pain in a different area of your pelvis. °· You have bad-smelling vaginal discharge. °· You have a rash. °Get help right away if: °· You have vaginal bleeding that soaks more than one sanitary pad in 1 hour, for 2 hours in a row. °· You pass large blood clots from your vagina. °· You have a fever that is above 100.4°F (38.0°C). °· Your abdomen feels very tender or hard. °· You have chest pain. °· You have shortness of breath. °· You cough up blood. °· You feel dizzy or light-headed. °· You faint. °· You have pain in your neck or shoulder area. °This information is not intended to replace advice given to you by your health care provider. Make sure you discuss any questions you have with your health care provider. °Document Released: 12/08/2000 Document Revised: 11/23/2017 Document Reviewed: 07/13/2016 °Elsevier Patient Education © 2020 [employer] ° °

## 2019-12-05 NOTE — Anesthesia Preprocedure Evaluation (Addendum)
Anesthesia Evaluation  Patient identified by MRN, date of birth, ID band Patient awake    Reviewed: Allergy & Precautions, H&P , NPO status , Patient's Chart, lab work & pertinent test results  Airway Mallampati: I  TM Distance: >3 FB Neck ROM: full    Dental  (+) Teeth Intact, Dental Advisory Given   Pulmonary neg pulmonary ROS, former smoker,    Pulmonary exam normal        Cardiovascular negative cardio ROS Normal cardiovascular exam     Neuro/Psych PSYCHIATRIC DISORDERS Anxiety Depression Bipolar Disorder Schizophrenia Hx overdose, suicide attemptnegative neurological ROS     GI/Hepatic negative GI ROS, (+)     substance abuse  marijuana use, Hx polysubstance abuse   Endo/Other  negative endocrine ROS  Renal/GU negative Renal ROS  negative genitourinary   Musculoskeletal negative musculoskeletal ROS (+)   Abdominal Normal abdominal exam  (+)   Peds  Hematology negative hematology ROS (+)   Anesthesia Other Findings Very sedated on exam, slurred speech, unable to keep eyes open. Received 1mg  ativan in ED. Follows commands.  Able to take tongue ring out. Pt states nose and nipple piercings are unable to be removed.  Denies any recent drug, alcohol or tobacco use. Denies any prescriptions to controlled substances.   Reproductive/Obstetrics Retained products after medical abortion one month ago- presented to ED with fever, chills. US shows retained products of conception                           Anesthesia Physical  Anesthesia Plan  ASA: III  Anesthesia Plan: General   Post-op Pain Management:    Induction: Intravenous  PONV Risk Score and Plan: 4 or greater and Ondansetron, Dexamethasone, Scopolamine patch - Pre-op, Treatment may vary due to age or medical condition and Midazolam  Airway Management Planned: Oral ETT  Additional Equipment: None  Intra-op Plan:    Post-operative Plan: Extubation in OR  Informed Consent: I have reviewed the patients History and Physical, chart, labs and discussed the procedure including the risks, benefits and alternatives for the proposed anesthesia with the patient or authorized representative who has indicated his/her understanding and acceptance.     Dental advisory given  Plan Discussed with: CRNA  Anesthesia Plan Comments:        Anesthesia Quick Evaluation

## 2019-12-05 NOTE — Transfer of Care (Signed)
Immediate Anesthesia Transfer of Care Note  Patient: Stacy Peterson  Procedure(s) Performed: DILATATION AND EVACUATION WITH ULTRASOUND GUIDANCE (N/A )  Patient Location: PACU  Anesthesia Type:General  Level of Consciousness: awake and alert   Airway & Oxygen Therapy: Patient Spontanous Breathing and Patient connected to face mask oxygen  Post-op Assessment: Report given to RN, Post -op Vital signs reviewed and stable and Patient moving all extremities X 4  Post vital signs: Reviewed and stable  Last Vitals:  Vitals Value Taken Time  BP 121/86 12/05/19 1017  Temp    Pulse    Resp 23 12/05/19 1018  SpO2    Vitals shown include unvalidated device data.  Last Pain:  Vitals:   12/05/19 0900  TempSrc: Oral  PainSc: 10-Worst pain ever         Complications: No apparent anesthesia complications

## 2019-12-05 NOTE — Progress Notes (Signed)
PACU NURSING NOTE: called to bedside by primary RN, pt was crying, cussing at staff, being verbally abusive to nursing staff. Spoke with pt to understand why she was upset. Stated she did not give permission "for them to remove stuff from body and I want it back right now". Explained to pt that it was common practice for tissues, etc that are removed during surgery to go to pathology for review per Surgeons request. Pt stated also that she wanted her medical records immediately, I told her that it does take a few days to obtain a paper copy of her EMR, that should would be required to complete a medical form and sign it and Medical Records would be able to make her copies. Copies of the Medical Records Request Form were printed and provided to patient. Attempted to call mother of patient by number provided in chart and rec no answer only voice mail. Pt would not provide any other numbers for me to assist her to being DC to home per her MD orders. Pt called and was very aggressive on phone with whom she stated was her boyfriend. Per Anesthesiologist, boyfriend is in [place] currently. Also spoke with an updated her surgeon for todays procedure, stated she would not  Be able to come at this time, but would call patient later to follow up with her and asked to remind pt that she has a follow up post op appt with MD in her office within two weeks. The AD and AC on duty today are aware and informed, updated of current situation.

## 2019-12-05 NOTE — Op Note (Signed)
Latina Craver PROCEDURE DATE: 12/04/2019 - 12/05/2019  PREOPERATIVE DIAGNOSIS: retained products of conception POSTOPERATIVE DIAGNOSIS: The same. PROCEDURE:     Dilation and Evacuation. SURGEON:  Dr. Mora Bellman  INDICATIONS: 27 y.o. [id number] with retained products of conception s/p medical abortion, needing surgical completion.  Risks of surgery were discussed with the patient including but not limited to: bleeding which may require transfusion; infection which may require antibiotics; injury to uterus or surrounding organs;need for additional procedures including laparotomy or laparoscopy; possibility of intrauterine scarring which may impair future fertility; and other postoperative/anesthesia complications. Written informed consent was obtained.    FINDINGS:  A 9-week size midline uterus, moderate amounts of products of conception, specimen sent to pathology.  ANESTHESIA:   General anesthesia, paracervical block. INTRAVENOUS FLUIDS:  550 ml of LR ESTIMATED BLOOD LOSS:  150 cc SPECIMENS:  Products of conception sent to pathology COMPLICATIONS:  None immediate.  PROCEDURE DETAILS:  The patient received intravenous antibiotics while in the preoperative area.  She was then taken to the operating room where general anesthesia was administered and was found to be adequate.  After an adequate timeout was performed, she was placed in the dorsal lithotomy position and examined; then prepped and draped in the sterile manner.   Her bladder was catheterized for an unmeasured amount of clear, yellow urine. A vaginal speculum was then placed in the patient's vagina and a single tooth tenaculum was applied to the anterior lip of the cervix.  A paracervical block using 0.5% Marcaine was administered. The cervix was gently dilated to accommodate a 9 mm suction curette that was gently advanced to the uterine fundus.  The suction device was then activated and curette slowly rotated to clear the uterus of  products of conception.  A sharp curettage was then performed to confirm complete emptying of the uterus. Intraoperative ultrasound revealed a thin endometrial lining measuring 5 mm. Patient noted to have persistent vaginal bleeding. Methergine IM was administered. There was minimal bleeding noted and the tenaculum removed with good hemostasis noted.   All instruments were removed from the patient's vagina. The patient tolerated the procedure well and was taken to the recovery area awake, and in stable condition.  The patient will be discharged to home as per PACU criteria.  Routine postoperative instructions given.  She was prescribed Percocet, Ibuprofen and Colace.  She will follow up in the clinic in 2 weeks for postoperative evaluation.

## 2019-12-05 NOTE — ED Notes (Signed)
Ultrasound at bedside

## 2019-12-06 ENCOUNTER — Other Ambulatory Visit: Payer: Self-pay

## 2019-12-06 ENCOUNTER — Emergency Department (HOSPITAL_COMMUNITY)
Admission: EM | Admit: 2019-12-06 | Discharge: 2019-12-06 | Disposition: A | Discharge status: Home / Self Care | Payer: Medicaid Other | Attending: Emergency Medicine | Admitting: Emergency Medicine

## 2019-12-06 DIAGNOSIS — R519 Headache, unspecified: Secondary | ICD-10-CM | POA: Insufficient documentation

## 2019-12-06 DIAGNOSIS — M542 Cervicalgia: Secondary | ICD-10-CM | POA: Insufficient documentation

## 2019-12-06 DIAGNOSIS — Z5321 Procedure and treatment not carried out due to patient leaving prior to being seen by health care provider: Secondary | ICD-10-CM | POA: Insufficient documentation

## 2019-12-06 LAB — URINE CULTURE: Culture: NO GROWTH
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
## 2019-12-07 ENCOUNTER — Telehealth (HOSPITAL_BASED_OUTPATIENT_CLINIC_OR_DEPARTMENT_OTHER): Payer: Self-pay | Admitting: Emergency Medicine

## 2019-12-07 ENCOUNTER — Encounter: Payer: Self-pay | Admitting: Obstetrics and Gynecology

## 2019-12-07 NOTE — Progress Notes (Signed)
Received call from microbiology regarding positive Gram positive cocci in one anaerobic blood culture tube. Blood cultures collected 12/04/19 when patient presented to ED with headache, fever of unknown origin, photophobia. Found to have retained products of conception and underwent D&E for same on 12/05/19, discharged home that day. Received rocephin and doxycycline while in hospital. Reviewed case with pharmacy, who suspect that given only 1 blood culture (out of 4) was positive, and it was Gram positive cocci in anaerobic tube, that this is likely contamination. They have put her on the close follow up list to ensure no other blood cultures grow positive.  Called patient to discuss the above, no answer and unable to leave VM due to mailbox being full. Will send patient a mychart message.   Feliz Beam, M.D. Attending Center for [employer] [facility])

## 2019-12-08 LAB — BLOOD CULTURE ID PANEL (REFLEXED)

## 2019-12-08 LAB — CULTURE, BLOOD (ROUTINE X 2): Culture: NO GROWTH

## 2019-12-08 LAB — CSF CULTURE W GRAM STAIN
Culture: NO GROWTH
Gram Stain: NONE SEEN
Special Requests: NORMAL

## 2019-12-08 LAB — GC/CHLAMYDIA PROBE AMP (~~LOC~~) NOT AT ARMC
Chlamydia: NEGATIVE
Neisseria Gonorrhea: NEGATIVE

## 2019-12-08 LAB — VDRL, CSF: VDRL Quant, CSF: NONREACTIVE

## 2019-12-08 LAB — SURGICAL PATHOLOGY

## 2019-12-10 ENCOUNTER — Encounter: Payer: Self-pay | Admitting: Obstetrics and Gynecology

## 2019-12-10 ENCOUNTER — Other Ambulatory Visit: Payer: Self-pay | Admitting: Obstetrics & Gynecology

## 2019-12-10 DIAGNOSIS — O034 Incomplete spontaneous abortion without complication: Secondary | ICD-10-CM

## 2019-12-10 LAB — CULTURE, BLOOD (ROUTINE X 2)
Special Requests: ADEQUATE
Special Requests: ADEQUATE

## 2019-12-10 LAB — HSV DNA BY PCR (REFERENCE LAB)
HSV 1 DNA: NEGATIVE
HSV 2 DNA: NEGATIVE

## 2019-12-10 MED ORDER — AMOXICILLIN-POT CLAVULANATE 875-125 MG PO TABS: 1.0000 | ORAL_TABLET | Freq: Two times a day (BID) | ORAL | 0 refills | Status: DC

## 2019-12-10 NOTE — Progress Notes (Signed)
Augmentin prescribed for positive blood culture

## 2019-12-10 NOTE — Progress Notes (Signed)
Patient being followed by [facility] pharmacy for one positive blood culture s/p D&C. Now has two positive blood cultures, recommended to be treated with abx, which has been prescribed and message sent to patient via mychart.    Feliz Beam, M.D. Attending [facility] for [employer] farm [facility])

## 2019-12-23 ENCOUNTER — Encounter: Payer: Self-pay | Admitting: General Practice

## 2019-12-23 ENCOUNTER — Ambulatory Visit: Payer: Medicaid Other | Admitting: Family Medicine

## 2019-12-23 ENCOUNTER — Encounter: Payer: Self-pay | Admitting: Family Medicine
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
## 2020-01-22 ENCOUNTER — Telehealth: Payer: Self-pay | Admitting: *Deleted

## 2020-01-22 ENCOUNTER — Ambulatory Visit: Payer: Medicaid Other | Admitting: Neurology

## 2020-01-22 NOTE — Telephone Encounter (Signed)
Pt no showed new pt appt this morning.  

## 2020-01-27 ENCOUNTER — Encounter: Payer: Self-pay | Admitting: Neurology

## 2021-06-18 ENCOUNTER — Encounter (HOSPITAL_COMMUNITY): Payer: Self-pay

## 2021-06-18 ENCOUNTER — Emergency Department (HOSPITAL_COMMUNITY)
Admission: EM | Admit: 2021-06-18 | Discharge: 2021-06-19 | Discharge status: Against Medical Advice | Payer: Self-pay | Attending: Emergency Medicine | Admitting: Emergency Medicine

## 2021-06-18 DIAGNOSIS — R079 Chest pain, unspecified: Secondary | ICD-10-CM | POA: Insufficient documentation

## 2021-06-18 DIAGNOSIS — Z5321 Procedure and treatment not carried out due to patient leaving prior to being seen by health care provider: Secondary | ICD-10-CM | POA: Insufficient documentation

## 2021-06-18 NOTE — ED Triage Notes (Signed)
Pt reports that for the past 5 days she has been having indigestion, n/v/d, seen at [facility] the other day for the same

## 2021-06-19 ENCOUNTER — Emergency Department (HOSPITAL_COMMUNITY): Payer: Self-pay

## 2021-06-19 LAB — URINALYSIS, ROUTINE W REFLEX MICROSCOPIC
Bacteria, UA: NONE SEEN
Bilirubin Urine: NEGATIVE
Glucose, UA: NEGATIVE mg/dL
Hgb urine dipstick: NEGATIVE
Ketones, ur: 80 mg/dL — AB
Nitrite: NEGATIVE
Protein, ur: 30 mg/dL — AB
Specific Gravity, Urine: 1.027 (ref 1.005–1.030)
pH: 9 — ABNORMAL HIGH (ref 5.0–8.0)

## 2021-06-19 LAB — BASIC METABOLIC PANEL
Anion gap: 10 (ref 5–15)
BUN: 8 mg/dL (ref 6–20)
CO2: 24 mmol/L (ref 22–32)
Calcium: 9.3 mg/dL (ref 8.9–10.3)
Chloride: 102 mmol/L (ref 98–111)
Creatinine, Ser: 0.89 mg/dL (ref 0.44–1.00)
GFR, Estimated: >60 mL/min (ref >60)
Glucose, Bld: 97 mg/dL (ref 70–99)
Potassium: 3.7 mmol/L (ref 3.5–5.1)
Sodium: 136 mmol/L (ref 135–145)

## 2021-06-19 LAB — CBC
HCT: 41.2 % (ref 36.0–46.0)
Hemoglobin: 13.8 g/dL (ref 12.0–15.0)
MCH: 31.7 pg (ref 26.0–34.0)
MCHC: 33.5 g/dL (ref 30.0–36.0)
MCV: 94.5 fL (ref 80.0–100.0)
Platelets: 236 10*3/uL (ref 150–400)
RBC: 4.36 MIL/uL (ref 3.87–5.11)
RDW: 13.2 % (ref 11.5–15.5)
WBC: 5.9 10*3/uL (ref 4.0–10.5)
nRBC: 0 % (ref 0.0–0.2)

## 2021-06-19 LAB — TROPONIN I (HIGH SENSITIVITY)
Troponin I (High Sensitivity): <2 ng/L (ref <18)
Troponin I (High Sensitivity): <2 ng/L (ref <18)

## 2021-06-19 LAB — I-STAT BETA HCG BLOOD, ED (MC, WL, AP ONLY): I-stat hCG, quantitative: <5 m[IU]/mL (ref <5)

## 2021-06-19 NOTE — ED Notes (Signed)
Pt called for vitals x1 with no response 

## 2021-06-19 NOTE — ED Notes (Signed)
Pt outside.  

## 2021-06-19 NOTE — ED Notes (Signed)
Pt called x 2 

## 2021-06-29 ENCOUNTER — Encounter (HOSPITAL_COMMUNITY): Payer: Self-pay | Admitting: Emergency Medicine

## 2021-06-29 ENCOUNTER — Emergency Department (HOSPITAL_COMMUNITY)
Admission: EM | Admit: 2021-06-29 | Discharge: 2021-06-30 | Disposition: A | Discharge status: Home / Self Care | Payer: Medicaid Other | Attending: Emergency Medicine | Admitting: Emergency Medicine

## 2021-06-29 ENCOUNTER — Other Ambulatory Visit: Payer: Self-pay

## 2021-06-29 DIAGNOSIS — F1920 Other psychoactive substance dependence, uncomplicated: Secondary | ICD-10-CM | POA: Insufficient documentation

## 2021-06-29 DIAGNOSIS — Z87891 Personal history of nicotine dependence: Secondary | ICD-10-CM | POA: Insufficient documentation

## 2021-06-29 DIAGNOSIS — N390 Urinary tract infection, site not specified: Secondary | ICD-10-CM

## 2021-06-29 DIAGNOSIS — R1110 Vomiting, unspecified: Secondary | ICD-10-CM | POA: Insufficient documentation

## 2021-06-29 DIAGNOSIS — R300 Dysuria: Secondary | ICD-10-CM | POA: Insufficient documentation

## 2021-06-29 DIAGNOSIS — F1290 Cannabis use, unspecified, uncomplicated: Secondary | ICD-10-CM | POA: Insufficient documentation

## 2021-06-29 DIAGNOSIS — F12188 Cannabis abuse with other cannabis-induced disorder: Secondary | ICD-10-CM

## 2021-06-29 LAB — URINALYSIS, ROUTINE W REFLEX MICROSCOPIC
Bilirubin Urine: NEGATIVE
Glucose, UA: NEGATIVE mg/dL
Ketones, ur: 80 mg/dL — AB
Nitrite: NEGATIVE
Protein, ur: 100 mg/dL — AB
Specific Gravity, Urine: 1.024 (ref 1.005–1.030)
WBC, UA: >50 WBC/hpf — ABNORMAL HIGH (ref 0–5)
pH: 5 (ref 5.0–8.0)

## 2021-06-29 LAB — CBC WITH DIFFERENTIAL/PLATELET
Abs Immature Granulocytes: 0.01 10*3/uL (ref 0.00–0.07)
Basophils Absolute: 0 10*3/uL (ref 0.0–0.1)
Basophils Relative: 0 %
Eosinophils Absolute: 0 10*3/uL (ref 0.0–0.5)
Eosinophils Relative: 0 %
HCT: 42.4 % (ref 36.0–46.0)
Hemoglobin: 14.5 g/dL (ref 12.0–15.0)
Immature Granulocytes: 0 %
Lymphocytes Relative: 21 %
Lymphs Abs: 1.2 10*3/uL (ref 0.7–4.0)
MCH: 31.5 pg (ref 26.0–34.0)
MCHC: 34.2 g/dL (ref 30.0–36.0)
MCV: 92 fL (ref 80.0–100.0)
Monocytes Absolute: 0.4 10*3/uL (ref 0.1–1.0)
Monocytes Relative: 7 %
Neutro Abs: 4 10*3/uL (ref 1.7–7.7)
Neutrophils Relative %: 72 %
Platelets: 222 10*3/uL (ref 150–400)
RBC: 4.61 MIL/uL (ref 3.87–5.11)
RDW: 13 % (ref 11.5–15.5)
WBC: 5.6 10*3/uL (ref 4.0–10.5)
nRBC: 0 % (ref 0.0–0.2)

## 2021-06-29 LAB — PREGNANCY, URINE: Preg Test, Ur: NEGATIVE

## 2021-06-29 NOTE — ED Triage Notes (Addendum)
Pt reports that she hasnt been able to eat right since getting out of jail last month. Reports intermittent abdominal complaints that she thought was due to "transitioning back to normal food." States that she has been vomiting multiple times per day. Reports that she feels dehydrated. Also reports that her urine is very dark in the morning.

## 2021-06-29 NOTE — ED Provider Notes (Signed)
Emergency Medicine Provider Triage Evaluation Note  Stacy Peterson , a 30 y.o. female  was evaluated in triage.  Pt complains of 2 weeks of urinary frequency urgency and dysuria.  She states that she is concerned that she has urinary tract infection.  She also states that she has been having nausea and vomiting.  She denies any diarrhea she states that she has not been able to eat or drink anything over the last couple days.  She states that she intermittently has abdominal pains but denies any consistent abdominal pain.  She denies any vaginal bleeding or dyspareunia.  She states that she was recently seen in urgent care and told that she was dehydrated and given some IV fluids and discharged home.  She states that she was recently incarcerated but was released from jail 1 month ago.  Patient states that she is using pills, fentanyl, marijuana in order to try to help her self sleep at night.  Review of Systems  Positive: Nausea and vomiting Negative: Fever  Physical Exam  BP (!) 130/95   Pulse (!) 110   Temp 98.9 F (37.2 C) (Oral)   Resp 18   SpO2 94%  Gen:   Awake, tearful, anxious Resp:  Normal effort, tachypneic in between episodes of speaking due to being tearful. MSK:   Moves extremities without difficulty  Other:  Abdomen soft nontender no guarding or rebound.  Medical Decision Making  Medically screening exam initiated at 10:32 PM.  Appropriate orders placed.  Nechama Guard was informed that the remainder of the evaluation will be completed by another provider, this initial triage assessment does not replace that evaluation, and the importance of remaining in the ED until their evaluation is complete.  Patient is ultimately nontoxic appearing 30 year old female seems to be in some discomfort seems to be very upset about her symptoms.  She is ultimately without any abdominal tenderness on examination.  Complaining of ongoing urinary symptoms including frequency urgency and dark  urine as well some dysuria however had a urinary analysis completed recently but I reviewed on EMR but does not have any evidence of infection.  Will obtain labs including urinalysis, urine pregnancy, urine drug screen and abdominal labs.   Gailen Shelter, [place] 06/29/21 2237    Laurence Spates, MD 06/30/21 [phone]

## 2021-06-30 LAB — COMPREHENSIVE METABOLIC PANEL
ALT: 21 U/L (ref 0–44)
AST: 25 U/L (ref 15–41)
Albumin: 4.9 g/dL (ref 3.5–5.0)
Alkaline Phosphatase: 61 U/L (ref 38–126)
Anion gap: 11 (ref 5–15)
BUN: 11 mg/dL (ref 6–20)
CO2: 26 mmol/L (ref 22–32)
Calcium: 9.8 mg/dL (ref 8.9–10.3)
Chloride: 99 mmol/L (ref 98–111)
Creatinine, Ser: 0.92 mg/dL (ref 0.44–1.00)
GFR, Estimated: >60 mL/min (ref >60)
Glucose, Bld: 82 mg/dL (ref 70–99)
Potassium: 4 mmol/L (ref 3.5–5.1)
Sodium: 136 mmol/L (ref 135–145)
Total Bilirubin: 0.9 mg/dL (ref 0.3–1.2)
Total Protein: 8.2 g/dL — ABNORMAL HIGH (ref 6.5–8.1)

## 2021-06-30 LAB — RAPID URINE DRUG SCREEN, HOSP PERFORMED
Amphetamines: NOT DETECTED
Barbiturates: NOT DETECTED
Benzodiazepines: NOT DETECTED
Cocaine: NOT DETECTED
Opiates: NOT DETECTED
Tetrahydrocannabinol: POSITIVE — AB

## 2021-06-30 LAB — LIPASE, BLOOD: Lipase: 29 U/L (ref 11–51)

## 2021-06-30 MED ORDER — ONDANSETRON 4 MG PO TBDP: ORAL_TABLET | ORAL | 0 refills | Status: DC

## 2021-06-30 MED ORDER — CEPHALEXIN 500 MG PO CAPS: 500.0000 mg | ORAL_CAPSULE | Freq: Three times a day (TID) | ORAL | 0 refills | Status: AC

## 2021-06-30 MED ORDER — SODIUM CHLORIDE 0.9 % IV SOLN
1.0000 g | Freq: Once | INTRAVENOUS | Status: AC
  Administered 2021-06-30: 1 g via INTRAVENOUS
  Filled 2021-06-30: qty 10

## 2021-06-30 MED ORDER — ONDANSETRON HCL 4 MG/2ML IJ SOLN
4.0000 mg | Freq: Once | INTRAMUSCULAR | Status: AC
  Administered 2021-06-30: 4 mg via INTRAVENOUS
  Filled 2021-06-30: qty 2

## 2021-06-30 MED ORDER — SODIUM CHLORIDE 0.9 % IV BOLUS
1000.0000 mL | Freq: Once | INTRAVENOUS | Status: AC
  Administered 2021-06-30: 1000 mL via INTRAVENOUS

## 2021-06-30 NOTE — ED Provider Notes (Signed)
[facility]-EMERGENCY DEPT Provider Note   CSN: [id number] Arrival date & time: 06/29/21  2210     History Chief Complaint  Patient presents with  . Vomiting    Stacy Peterson is a 30 y.o. female here presenting with vomiting. Patient uses marijuana chronically.  Patient has been vomiting for 2 weeks. Patient states thatShe went to urgent care about 2 weeks ago and was given IV fluids and was sent home. Patient continues to use marijuana.  She also continues to vomit.  She also has some dysuria and was concerned maybe she has a urinary tract infection. Denies any fevers or sick contacts.  The history is provided by the patient.      Past Medical History:  Diagnosis Date  . Bipolar 1 disorder (HCC)   . Chlamydia   . Depression   . Preterm labor   . Schizophrenia (HCC)   . Shingles   . Trichomonas vaginitis     Patient Active Problem List   Diagnosis Date Noted  . Retained products of conception following abortion 12/05/2019  . Fever of unknown origin 12/05/2019  . Substance induced mood disorder (HCC)   . Pneumothorax 02/21/2019  . 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 Marijuana use 12/11/2016  . Recurrent major depression (HCC) 02/11/2016  . Depression, major, recurrent, 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/11/2016  . PTSD (post-traumatic stress disorder) 02/11/2016  . Polysubstance dependence (HCC) 02/11/2016  . Intentional overdose of drug in tablet form (HCC)   . Schizoaffective disorder, bipolar type (HCC) 01/17/2016  . Drug overdose, intentional (HCC) 01/17/2016  . 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 Left-sided low back pain with sciatica 04/30/2014    Past Surgical History:  Procedure Laterality Date  . DILATION AND EVACUATION N/A 12/05/2019   Procedure: DILATATION AND EVACUATION WITH ULTRASOUND GUIDANCE;  Surgeon: Catalina Antigua, MD;  Location: [facility];  Service: Gynecology;  Laterality: N/A;  . KNEE SURGERY       OB History     Gravida  5    Para  4   Term  3   Preterm  1   AB  1   Living  3      SAB  0   IAB  0   Ectopic      Multiple  0   Live Births  3           Family History  Problem Relation Age of Onset  . Diabetes Paternal Grandmother   . Diabetes Paternal Grandfather     Social History   Tobacco Use  . Smoking status: Former    Packs/day: 0.25    Pack years: 0.00    Types: Cigarettes  . Smokeless tobacco: Never  Substance Use Topics  . Alcohol use: No    Alcohol/week: 12.0 standard drinks    Types: 12 Cans of beer per week  . Drug use: Yes    Types: Marijuana    Comment: marijuana inearly pregnancy    Home Medications Prior to Admission medications   Medication Sig Start Date End Date Taking? Authorizing Provider  amoxicillin-clavulanate (AUGMENTIN) 875-125 MG tablet Take 1 tablet by mouth 2 (two) times daily. 12/10/19   Adam Phenix, MD  Butalbital-APAP-Caffeine [phone] MG capsule Take 1-2 capsules by mouth every 6 (six) hours as needed for headache. 12/05/19   Constant, Gigi Gin, MD  docusate sodium (COLACE) 100 MG capsule Take 1 capsule (100 mg total) by mouth 2 (two) times daily as needed. 12/05/19  Constant, Peggy, MD  ferrous sulfate (FERROUSUL) 325 (65 FE) MG tablet Take 1 tablet (325 mg total) by mouth 2 (two) times daily. 12/05/19   Constant, Peggy, MD  ibuprofen (ADVIL) 600 MG tablet Take 1 tablet (600 mg total) by mouth every 6 (six) hours as needed. 12/05/19   Constant, Peggy, MD  oxyCODONE-acetaminophen (PERCOCET/ROXICET) 5-325 MG tablet Take 1 tablet by mouth every 6 (six) hours as needed. 12/05/19   Constant, Peggy, MD    Allergies    Patient has no known allergies.  Review of Systems   Review of Systems  Gastrointestinal:  Positive for vomiting.  Genitourinary:  Positive for dysuria.  All other systems reviewed and are negative.  Physical Exam Updated Vital Signs BP 102/67   Pulse 76   Temp 98.9 F (37.2 C) (Oral)   Resp 18   SpO2 97%   Physical  Exam Vitals and nursing note reviewed.  Constitutional:      Appearance: Normal appearance.     Comments: Slightly dehydrated   HENT:     Head: Normocephalic.     Nose: Nose normal.     Mouth/Throat:     Mouth: Mucous membranes are dry.  Eyes:     Extraocular Movements: Extraocular movements intact.     Pupils: Pupils are equal, round, and reactive to light.  Cardiovascular:     Rate and Rhythm: Normal rate and regular rhythm.     Pulses: Normal pulses.     Heart sounds: Normal heart sounds.  Pulmonary:     Effort: Pulmonary effort is normal.     Breath sounds: Normal breath sounds.  Abdominal:     General: Abdomen is flat.     Palpations: Abdomen is soft.  Musculoskeletal:        General: Normal range of motion.     Cervical back: Normal range of motion and neck supple.  Skin:    General: Skin is warm.     Capillary Refill: Capillary refill takes less than 2 seconds.  Neurological:     General: No focal deficit present.     Mental Status: She is alert and oriented to person, place, and time.  Psychiatric:        Mood and Affect: Mood normal.        Behavior: Behavior normal.    ED Results / Procedures / Treatments   Labs (all labs ordered are listed, but only abnormal results are displayed) Labs Reviewed  URINALYSIS, ROUTINE W REFLEX MICROSCOPIC - Abnormal; Notable for the following components:      Result Value   APPearance HAZY (*)    Hgb urine dipstick SMALL (*)    Ketones, ur 80 (*)    Protein, ur 100 (*)    Leukocytes,Ua MODERATE (*)    WBC, UA >50 (*)    Bacteria, UA RARE (*)    All other components within normal limits  RAPID URINE DRUG SCREEN, HOSP PERFORMED - Abnormal; Notable for the following components:   Tetrahydrocannabinol POSITIVE (*)    All other components within normal limits  COMPREHENSIVE METABOLIC PANEL - Abnormal; Notable for the following components:   Total Protein 8.2 (*)    All other components within normal limits  URINE CULTURE   CBC WITH DIFFERENTIAL/PLATELET  PREGNANCY, URINE  LIPASE, BLOOD    EKG None  Radiology No results found.  Procedures Procedures   Medications Ordered in ED Medications  cefTRIAXone (ROCEPHIN) 1 g in sodium chloride 0.9 % 100 mL IVPB (has  no administration in time range)  sodium chloride 0.9 % bolus 1,000 mL (1,000 mLs Intravenous New Bag/Given 06/30/21 0238)  ondansetron (ZOFRAN) injection 4 mg (4 mg Intravenous Given 06/30/21 0236)    ED Course  I have reviewed the triage vital signs and the nursing notes.  Pertinent labs & imaging results that were available during my care of the patient were reviewed by me and considered in my medical decision making (see chart for details).    MDM Rules/Calculators/A&P                          Stacy Peterson is a 30 y.o. female here presenting with vomiting and dysuria.  Patient uses marijuana chronically so I suspect cannabis induced hyperemesis.  Plan to get CBC and CMP and UDS and urinalysis and pregnancy test.  3:45 AM Patient's UCG is negative.  Her UA is equivocal for UTI but since she is symptomatic, we will give her a course of Keflex.  Patient's UDS is positive for marijuana.  Her kidney function is normal.  Patient was given IV fluids and Zofran and felt better.  Will discharge patient home with nausea medicine and Keflex.  Patient requests GI referral.  Told her to stop using marijuana.  Final Clinical Impression(s) / ED Diagnoses Final diagnoses:  None    Rx / DC Orders ED Discharge Orders     None        Charlynne Pander, MD 06/30/21 [phone]

## 2021-06-30 NOTE — Discharge Instructions (Addendum)
Take Zofran for nausea  Stop using marijuana  Stay hydrated   Take Keflex as prescribed for UTI.   See GI for follow up   Return to ER if you have worse abdominal pain or vomiting or dehydration

## 2021-07-01 LAB — URINE CULTURE: Culture: NO GROWTH

## 2021-07-10 ENCOUNTER — Encounter (HOSPITAL_COMMUNITY): Payer: Self-pay

## 2021-07-10 ENCOUNTER — Other Ambulatory Visit: Payer: Self-pay

## 2021-07-10 ENCOUNTER — Emergency Department (HOSPITAL_COMMUNITY)
Admission: EM | Admit: 2021-07-10 | Discharge: 2021-07-11 | Disposition: A | Discharge status: Home / Self Care | Payer: PRIVATE HEALTH INSURANCE | Attending: Emergency Medicine | Admitting: Emergency Medicine

## 2021-07-10 ENCOUNTER — Emergency Department (HOSPITAL_COMMUNITY): Payer: PRIVATE HEALTH INSURANCE

## 2021-07-10 DIAGNOSIS — R509 Fever, unspecified: Secondary | ICD-10-CM | POA: Insufficient documentation

## 2021-07-10 DIAGNOSIS — R39198 Other difficulties with micturition: Secondary | ICD-10-CM

## 2021-07-10 DIAGNOSIS — R5383 Other fatigue: Secondary | ICD-10-CM | POA: Insufficient documentation

## 2021-07-10 DIAGNOSIS — Z87891 Personal history of nicotine dependence: Secondary | ICD-10-CM | POA: Insufficient documentation

## 2021-07-10 DIAGNOSIS — R059 Cough, unspecified: Secondary | ICD-10-CM | POA: Insufficient documentation

## 2021-07-10 DIAGNOSIS — Z79899 Other long term (current) drug therapy: Secondary | ICD-10-CM | POA: Insufficient documentation

## 2021-07-10 DIAGNOSIS — R519 Headache, unspecified: Secondary | ICD-10-CM | POA: Insufficient documentation

## 2021-07-10 DIAGNOSIS — Z20822 Contact with and (suspected) exposure to covid-19: Secondary | ICD-10-CM | POA: Insufficient documentation

## 2021-07-10 DIAGNOSIS — K5900 Constipation, unspecified: Secondary | ICD-10-CM | POA: Insufficient documentation

## 2021-07-10 DIAGNOSIS — R112 Nausea with vomiting, unspecified: Secondary | ICD-10-CM | POA: Insufficient documentation

## 2021-07-10 LAB — CBC WITH DIFFERENTIAL/PLATELET
Abs Immature Granulocytes: 0.01 10*3/uL (ref 0.00–0.07)
Basophils Absolute: 0 10*3/uL (ref 0.0–0.1)
Basophils Relative: 0 %
Eosinophils Absolute: 0 10*3/uL (ref 0.0–0.5)
Eosinophils Relative: 1 %
HCT: 42.4 % (ref 36.0–46.0)
Hemoglobin: 14.7 g/dL (ref 12.0–15.0)
Immature Granulocytes: 0 %
Lymphocytes Relative: 20 %
Lymphs Abs: 1.1 10*3/uL (ref 0.7–4.0)
MCH: 31.3 pg (ref 26.0–34.0)
MCHC: 34.7 g/dL (ref 30.0–36.0)
MCV: 90.2 fL (ref 80.0–100.0)
Monocytes Absolute: 0.5 10*3/uL (ref 0.1–1.0)
Monocytes Relative: 9 %
Neutro Abs: 4 10*3/uL (ref 1.7–7.7)
Neutrophils Relative %: 70 %
Platelets: 204 10*3/uL (ref 150–400)
RBC: 4.7 MIL/uL (ref 3.87–5.11)
RDW: 12.7 % (ref 11.5–15.5)
WBC: 5.7 10*3/uL (ref 4.0–10.5)
nRBC: 0 % (ref 0.0–0.2)

## 2021-07-10 LAB — COMPREHENSIVE METABOLIC PANEL
ALT: 28 U/L (ref 0–44)
AST: 32 U/L (ref 15–41)
Albumin: 4.7 g/dL (ref 3.5–5.0)
Alkaline Phosphatase: 51 U/L (ref 38–126)
Anion gap: 14 (ref 5–15)
BUN: 9 mg/dL (ref 6–20)
CO2: 23 mmol/L (ref 22–32)
Calcium: 10.1 mg/dL (ref 8.9–10.3)
Chloride: 100 mmol/L (ref 98–111)
Creatinine, Ser: 0.71 mg/dL (ref 0.44–1.00)
GFR, Estimated: >60 mL/min (ref >60)
Glucose, Bld: 100 mg/dL — ABNORMAL HIGH (ref 70–99)
Potassium: 3.4 mmol/L — ABNORMAL LOW (ref 3.5–5.1)
Sodium: 137 mmol/L (ref 135–145)
Total Bilirubin: 0.9 mg/dL (ref 0.3–1.2)
Total Protein: 7.8 g/dL (ref 6.5–8.1)

## 2021-07-10 LAB — I-STAT BETA HCG BLOOD, ED (MC, WL, AP ONLY): I-stat hCG, quantitative: <5 m[IU]/mL (ref <5)

## 2021-07-10 LAB — RESP PANEL BY RT-PCR (FLU A&B, COVID) ARPGX2
Influenza A by PCR: NEGATIVE
Influenza B by PCR: NEGATIVE
SARS Coronavirus 2 by RT PCR: NEGATIVE

## 2021-07-10 LAB — LIPASE, BLOOD: Lipase: 31 U/L (ref 11–51)

## 2021-07-10 MED ORDER — IOHEXOL 350 MG/ML SOLN
80.0000 mL | Freq: Once | INTRAVENOUS | Status: AC | PRN
  Administered 2021-07-10: 80 mL via INTRAVENOUS

## 2021-07-10 MED ORDER — SODIUM CHLORIDE 0.9 % IV BOLUS
1000.0000 mL | Freq: Once | INTRAVENOUS | Status: AC
  Administered 2021-07-10: 1000 mL via INTRAVENOUS

## 2021-07-10 MED ORDER — METOCLOPRAMIDE HCL 5 MG/ML IJ SOLN
10.0000 mg | Freq: Once | INTRAMUSCULAR | Status: AC
  Administered 2021-07-10: 10 mg via INTRAVENOUS
  Filled 2021-07-10: qty 2

## 2021-07-10 MED ORDER — DIPHENHYDRAMINE HCL 50 MG/ML IJ SOLN
50.0000 mg | Freq: Once | INTRAMUSCULAR | Status: AC
  Administered 2021-07-10: 50 mg via INTRAVENOUS
  Filled 2021-07-10: qty 1

## 2021-07-10 NOTE — ED Provider Notes (Signed)
Stacy Peterson is a 30 y.o. female,  presenting to the ED primarily complaining of nausea and vomiting.  This has been recurrent over at least the last month.  HPI from Parke Poisson, PA-C: "Stacy Peterson is a 30 y.o. female with a history of bipolar 1 disorder, schizophrenia, polysubstance use.  Patient presents to  the emergency department with a chief complaint of nausea, vomiting, constipation, and headache.     Patient reports that she has been having nausea, vomiting, and constipation for the last month.  Patient reports that she had improvement with antinausea medication she was prescribed from emergency department on 7/6 however she is almost out of this medication.  Patient reports vomiting 7-9 times in the last 24 hours.  Patient describes emesis as stomach contents and bilious.  Patient denies any hematemesis or coffee-ground emesis.  Patient states that she is having trouble keeping things down and therefore has had decreased p.o. intake.  Patient reports that she has not smoked marijuana since being discharged from the hospital on 7/6.  Patient reports intermittent lower left quadrant abdominal cramping over the last month.     Patient reports intermittent constipation over the last month.  Patient states that she has not had a bowel movement in the last week.  Prior to that patient denies any diarrhea, blood in stool, or melena.  Patient also reports that she has had intermittent headaches over the last month.  Patient reports that headache pain location moves around her head.  Patient complains of headache at present.  Pain is currently located to the right side of her head.  Patient rates pain 6/10 on the pain scale.  Patient reports that headache onset was gradual and pain has gotten progressively worse since starting.  Denies any alleviating factors.  Patient reports pain is worse with sudden movements.   Patient states that last night she started feeling like she could not take a  full deep breath.  Patient denies any chest pain, palpitations, unilateral leg swelling or tenderness, history of DVT or PE, cancer treatment, hemoptysis, hormone therapy, surgery in the last 12 weeks.  Patient also endorses concern over her urine.  States that she is producing less urine and it is dark in color.  Patient denies any dysuria, hematuria, or urinary frequency.  LMP 6/28.  G3 P2-0-1-2.  Patient reports that she has not been sexually active since last months.  Patient denies any history of abdominal surgeries.  Patient denies any known sick contacts.  Patient denies any IV drug use."   Past Medical History:  Diagnosis Date   Bipolar 1 disorder (HCC)    Chlamydia    Depression    Preterm labor    Schizophrenia (HCC)    Shingles    Trichomonas vaginitis     Physical Exam  BP (!) 123/111 (BP Location: Left Arm)   Pulse (!) 111   Temp 98.5 F (36.9 C) (Oral)   Resp 18   SpO2 99%   Physical Exam Vitals and nursing note reviewed.  Constitutional:      General: She is not in acute distress.    Appearance: She is well-developed. She is not diaphoretic.  HENT:     Head: Normocephalic and atraumatic.     Mouth/Throat:     Mouth: Mucous membranes are moist.     Pharynx: Oropharynx is clear.  Eyes:     Conjunctiva/sclera: Conjunctivae normal.  Cardiovascular:     Rate and Rhythm: Normal rate and regular  rhythm.     Heart sounds: Normal heart sounds.  Pulmonary:     Effort: Pulmonary effort is normal. No respiratory distress.     Breath sounds: Normal breath sounds.  Abdominal:     Palpations: Abdomen is soft.     Tenderness: There is no guarding.     Comments: Patient states she feels a "pulling" when RLQ is palpated.  Musculoskeletal:     Cervical back: Neck supple.  Lymphadenopathy:     Cervical: No cervical adenopathy.  Skin:    General: Skin is warm and dry.  Neurological:     Mental Status: She is alert.  Psychiatric:        Mood and Affect: Mood and  affect normal.        Speech: Speech normal.        Behavior: Behavior normal.    ED Course/Procedures    Procedures  Abnormal Labs Reviewed  COMPREHENSIVE METABOLIC PANEL - Abnormal; Notable for the following components:      Result Value   Potassium 3.4 (*)    Glucose, Bld 100 (*)    All other components within normal limits    CT ABDOMEN PELVIS W CONTRAST  Result Date: 07/11/2021 CLINICAL DATA:  Lower abdominal pain EXAM: CT ABDOMEN AND PELVIS WITH CONTRAST TECHNIQUE: Multidetector CT imaging of the abdomen and pelvis was performed using the standard protocol following bolus administration of intravenous contrast. CONTRAST:  78mL OMNIPAQUE IOHEXOL 350 MG/ML SOLN COMPARISON:  12/03/2021 FINDINGS: Lower chest: Lung bases are clear. Hepatobiliary: Liver is within normal limits, noting focal fat/altered perfusion along the falciform ligament. Gallbladder is unremarkable. No intrahepatic or extrahepatic ductal dilatation. Pancreas: Within normal limits. Spleen: Within normal limits. Adrenals/Urinary Tract: Adrenal glands are within normal limits. Kidneys are within normal limits.  No hydronephrosis. Bladder is within normal limits. Stomach/Bowel: Stomach is within normal limits. No evidence of bowel obstruction. Normal appendix (series 2/image 46). No colonic wall thickening or inflammatory changes. Vascular/Lymphatic: No evidence of abdominal aortic aneurysm. No suspicious abdominopelvic lymphadenopathy. Reproductive: Uterus is within normal limits. Bilateral ovaries are within normal limits. Other: Trace pelvic fluid. Musculoskeletal: Visualized osseous structures are within normal limits. IMPRESSION: Negative CT abdomen/pelvis.  Normal appendix. Electronically Signed   By: Charline Bills M.D.   On: 07/11/2021 00:19    MDM   Clinical Course as of 07/11/21 0058  Sun Jul 10, 2021  2230 BP(!): 94/45 Patient lying on her side; BP cuff needed to be adjusted. Repeat 120/72. [SJ]  Mon Jul 11, 2021  0039 BP(!): 98/57 Patient laying on her side during this; WNL when cuff is appropriately placed.  [HM]    Clinical Course User Index [HM] Muthersbaugh, Dahlia Client, PA-C [SJ] [facility]    Received patient care handoff report from Parke Poisson, PA-C: Plan: Lab work pending.  Patient presents with nausea, vomiting, subjective fever intermittently over the last month. Patient is nontoxic appearing, afebrile, not tachycardic during my care, not tachypneic, not hypotensive, maintains excellent SPO2 on room air.  I have reviewed the patient's chart to obtain more information.   I reviewed and interpreted the patient's labs and radiological studies. No leukocytosis.  Other lab work reassuring.  CT without acute abnormality. Patient encouraged to secure care with a PCP.  Follow-up with GI and urology.  End of shift patient care handoff report given to [facility], PA-C. Plan: UA and p.o. challenge.  Vitals:   07/10/21 2130 07/10/21 2145 07/10/21 2303 07/11/21 0015  BP:  Marland Kitchen)  94/45 103/65 (!) 98/57  Pulse: (!) 56 (!) 58 71 88  Resp:  16 15 16   Temp:      TempSrc:      SpO2: 92% 97% 95% 98%      07/11/21 0059    07/13/21, MD 07/12/21 1557

## 2021-07-10 NOTE — ED Provider Notes (Signed)
[facility]-EMERGENCY DEPT Provider Note   CSN: [id number] Arrival date & time: 07/10/21  1844     History Chief Complaint  Patient presents with   Emesis   Fever   Fatigue    Stacy Peterson is a 30 y.o. female with a history of bipolar 1 disorder, schizophrenia, polysubstance use.  Patient presents to  the emergency department with a chief complaint of nausea, vomiting, constipation, and headache.    Patient reports that she has been having nausea, vomiting, and constipation for the last month.  Patient reports that she had improvement with antinausea medication she was prescribed from emergency department on 7/6 however she is almost out of this medication.  Patient reports vomiting 7-9 times in the last 24 hours.  Patient describes emesis as stomach contents and bilious.  Patient denies any hematemesis or coffee-ground emesis.  Patient states that she is having trouble keeping things down and therefore has had decreased p.o. intake.  Patient reports that she has not smoked marijuana since being discharged from the hospital on 7/6.  Patient reports intermittent lower left quadrant abdominal cramping over the last month.    Patient reports intermittent constipation over the last month.  Patient states that she has not had a bowel movement in the last week.  Prior to that patient denies any diarrhea, blood in stool, or melena.  Patient also reports that she has had intermittent headaches over the last month.  Patient reports that headache pain location moves around her head.  Patient complains of headache at present.  Pain is currently located to the right side of her head.  Patient rates pain 6/10 on the pain scale.  Patient reports that headache onset was gradual and pain has gotten progressively worse since starting.  Denies any alleviating factors.  Patient reports pain is worse with sudden movements.  Patient states that last night she started feeling like she could  not take a full deep breath.  Patient denies any chest pain, palpitations, unilateral leg swelling or tenderness, history of DVT or PE, cancer treatment, hemoptysis, hormone therapy, surgery in the last 12 weeks.  Patient also endorses concern over her urine.  States that she is producing less urine and it is dark in color.  Patient denies any dysuria, hematuria, or urinary frequency.  LMP 6/28.  G3 P2-0-1-2.  Patient reports that she has not been sexually active since last months.  Patient denies any history of abdominal surgeries.  Patient denies any known sick contacts.  Patient denies any IV drug use.   Emesis Associated symptoms: chills, cough (nonproductive) and headaches   Associated symptoms: no abdominal pain, no diarrhea, no fever and no sore throat   Fever Associated symptoms: chills, cough (nonproductive), headaches, nausea and vomiting   Associated symptoms: no chest pain, no confusion, no congestion, no diarrhea, no dysuria, no rash, no rhinorrhea and no sore throat       Past Medical History:  Diagnosis Date   Bipolar 1 disorder (HCC)    Chlamydia    Depression    Preterm labor    Schizophrenia (HCC)    Shingles    Trichomonas vaginitis     Patient Active Problem List   Diagnosis Date Noted   Retained products of conception following abortion 12/05/2019   Fever of unknown origin 12/05/2019   Substance induced mood disorder (HCC)    Pneumothorax 02/21/2019   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   Marijuana use 12/11/2016   Recurrent major depression (  HCC) 02/11/2016   Depression, major, recurrent, 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/11/2016   PTSD (post-traumatic stress disorder) 02/11/2016   Polysubstance dependence (HCC) 02/11/2016   Intentional overdose of drug in tablet form (HCC)    Schizoaffective disorder, bipolar type (HCC) 01/17/2016   Drug overdose, intentional (HCC) 01/17/2016   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   Left-sided low back pain with sciatica  04/30/2014    Past Surgical History:  Procedure Laterality Date   DILATION AND EVACUATION N/A 12/05/2019   Procedure: DILATATION AND EVACUATION WITH ULTRASOUND GUIDANCE;  Surgeon: Catalina Antigua, MD;  Location: [facility] ORS;  Service: Gynecology;  Laterality: N/A;   KNEE SURGERY       OB History     Gravida  5   Para  4   Term  3   Preterm  1   AB  1   Living  3      SAB  0   IAB  0   Ectopic      Multiple  0   Live Births  3           Family History  Problem Relation Age of Onset   Diabetes Paternal Grandmother    Diabetes Paternal Grandfather     Social History   Tobacco Use   Smoking status: Former    Packs/day: 0.25    Types: Cigarettes   Smokeless tobacco: Never  Substance Use Topics   Alcohol use: No    Alcohol/week: 12.0 standard drinks    Types: 12 Cans of beer per week   Drug use: Yes    Types: Marijuana    Comment: marijuana inearly pregnancy    Home Medications Prior to Admission medications   Medication Sig Start Date End Date Taking? Authorizing Provider  amoxicillin-clavulanate (AUGMENTIN) 875-125 MG tablet Take 1 tablet by mouth 2 (two) times daily. 12/10/19   Adam Phenix, MD  Butalbital-APAP-Caffeine [phone] MG capsule Take 1-2 capsules by mouth every 6 (six) hours as needed for headache. 12/05/19   Constant, Peggy, MD  cephALEXin (KEFLEX) 500 MG capsule Take 1 capsule (500 mg total) by mouth 3 (three) times daily. 06/30/21   Charlynne Pander, MD  docusate sodium (COLACE) 100 MG capsule Take 1 capsule (100 mg total) by mouth 2 (two) times daily as needed. 12/05/19   Constant, Peggy, MD  ferrous sulfate (FERROUSUL) 325 (65 FE) MG tablet Take 1 tablet (325 mg total) by mouth 2 (two) times daily. 12/05/19   Constant, Peggy, MD  ibuprofen (ADVIL) 600 MG tablet Take 1 tablet (600 mg total) by mouth every 6 (six) hours as needed. 12/05/19   Constant, Peggy, MD  ondansetron (ZOFRAN ODT) 4 MG disintegrating tablet 4mg  ODT q4 hours prn  nausea/vomit 06/30/21   08/31/21, MD  oxyCODONE-acetaminophen (PERCOCET/ROXICET) 5-325 MG tablet Take 1 tablet by mouth every 6 (six) hours as needed. 12/05/19   Constant, Peggy, MD    Allergies    Patient has no known allergies.  Review of Systems   Review of Systems  Constitutional:  Positive for chills and fatigue. Negative for fever.  HENT:  Negative for congestion, rhinorrhea and sore throat.   Eyes:  Negative for visual disturbance.  Respiratory:  Positive for cough (nonproductive) and shortness of breath.   Cardiovascular:  Negative for chest pain, palpitations and leg swelling.  Gastrointestinal:  Positive for constipation, nausea and vomiting. Negative for abdominal distention, abdominal pain, anal bleeding, blood in stool, diarrhea and rectal pain.  Genitourinary:  Positive  for decreased urine volume. Negative for difficulty urinating, dysuria, frequency, hematuria, vaginal bleeding, vaginal discharge and vaginal pain.  Musculoskeletal:  Negative for back pain and neck pain.  Skin:  Negative for color change, pallor, rash and wound.  Neurological:  Positive for headaches. Negative for dizziness, tremors, seizures, syncope, facial asymmetry, speech difficulty, weakness, light-headedness and numbness.  Psychiatric/Behavioral:  Negative for confusion.    Physical Exam Updated Vital Signs BP (!) 147/103 (BP Location: Left Arm)   Pulse (!) 125   Temp 98.5 F (36.9 C) (Oral)   Resp (!) 22   SpO2 92%   Physical Exam Vitals and nursing note reviewed.  Constitutional:      General: She is not in acute distress.    Appearance: She is not ill-appearing, toxic-appearing or diaphoretic.  HENT:     Head: Normocephalic.  Eyes:     General: No scleral icterus.       Right eye: No discharge.        Left eye: No discharge.  Cardiovascular:     Rate and Rhythm: Normal rate.     Comments: Upon entering room heart rate was in the 80s, asked patient speaks and becomes anxious  heart rate becomes tachycardic into the 120s Pulmonary:     Effort: Pulmonary effort is normal. No tachypnea, bradypnea or respiratory distress.     Breath sounds: Normal breath sounds. No stridor.  Abdominal:     General: Abdomen is flat. Bowel sounds are normal. There is no distension. There are no signs of injury.     Palpations: Abdomen is soft. There is no mass or pulsatile mass.     Tenderness: There is no abdominal tenderness. There is no right CVA tenderness, left CVA tenderness, guarding or rebound.     Hernia: There is no hernia in the umbilical area or ventral area.  Musculoskeletal:     Right lower leg: Normal. No swelling or tenderness. No edema.     Left lower leg: Normal. No swelling or tenderness. No edema.  Skin:    General: Skin is warm and dry.     Coloration: Skin is not jaundiced or pale.  Neurological:     General: No focal deficit present.     Mental Status: She is alert and oriented to person, place, and time.     GCS: GCS eye subscore is 4. GCS verbal subscore is 5. GCS motor subscore is 6.     Cranial Nerves: No facial asymmetry.     Comments: No slurred speech or facial asymmetry.  Patient able to move all limbs equally without difficulty.  Psychiatric:        Mood and Affect: Mood is anxious. Affect is tearful.        Behavior: Behavior is cooperative.     Comments: When speaking about her symptoms patient becomes anxious and tearful    ED Results / Procedures / Treatments   Labs (all labs ordered are listed, but only abnormal results are displayed) Labs Reviewed  RESP PANEL BY RT-PCR (FLU A&B, COVID) ARPGX2  CBC WITH DIFFERENTIAL/PLATELET  COMPREHENSIVE METABOLIC PANEL  LIPASE, BLOOD  URINALYSIS, ROUTINE W REFLEX MICROSCOPIC  RAPID URINE DRUG SCREEN, HOSP PERFORMED  I-STAT BETA HCG BLOOD, ED ([facility], [facility], [facility] ONLY)    EKG None  Radiology No results found.  Procedures Procedures   Medications Ordered in ED Medications  sodium chloride 0.9 %  bolus 1,000 mL (has no administration in time range)  metoCLOPramide (REGLAN) injection 10 mg (  has no administration in time range)  diphenhydrAMINE (BENADRYL) injection 50 mg (has no administration in time range)    ED Course  I have reviewed the triage vital signs and the nursing notes.  Pertinent labs & imaging results that were available during my care of the patient were reviewed by me and considered in my medical decision making (see chart for details).    MDM Rules/Calculators/A&P                          Alert 30 year old female no acute distress, nontoxic-appearing.  Patient presents to  the emergency department with a chief complaint of nausea, vomiting, constipation, and headache. Patient's nausea, vomiting, constipation, and headache have been intermittent over the last month.    Physical exam abdomen soft, nondistended, nontender tender.  No guarding or rebound tenderness.  Will obtain CBC, CMP, lipase, beta-hCG, urinalysis, UDS.  Will give patient migraine cocktail for headache and nausea.  We will give patient 1 L fluid bolus  Lab work pending at this time.  Patient care transferred to PA Joy at the end of my shift. Patient presentation, ED course, and plan of care discussed with review of all pertinent labs and imaging. Please see his/her note for further details regarding further ED course and disposition.   Final Clinical Impression(s) / ED Diagnoses Final diagnoses:  None    Rx / DC Orders ED Discharge Orders     None        Berneice Heinrich 07/10/21 2223    Margarita Grizzle, MD 07/12/21 1556

## 2021-07-10 NOTE — ED Triage Notes (Signed)
Pt reports being seen here recently for UTI and given prescription for Keflex. Pt reports improved symptoms, but states that her urine is still very dark and only a little comes out at a time. Pt reports becoming easily fatigued. Pt is tearful and upset during triage.

## 2021-07-10 NOTE — Discharge Instructions (Addendum)
Nausea and Vomiting  Hand washing: Wash your hands throughout the day, but especially before and after touching the face, using the restroom, sneezing, coughing, or touching surfaces that have been coughed or sneezed upon. Hydration: Symptoms will be intensified and complicated by dehydration. Dehydration can also extend the duration of symptoms. Drink plenty of fluids and get plenty of rest. You should be drinking at least half a liter of water an hour to stay hydrated. Electrolyte drinks (ex. Gatorade, Powerade, Pedialyte) are also encouraged. You should be drinking enough fluids to make your urine light yellow, almost clear. If this is not the case, you are not drinking enough water. Please note that some of the treatments indicated below will not be effective if you are not adequately hydrated. Diet: Please concentrate on hydration, however, you may introduce food slowly.  Start with a clear liquid diet, progressed to a full liquid diet, and then bland solids as you are able. Pain or fever: Ibuprofen, Naproxen, or Tylenol for pain or fever.  Nausea/vomiting: Use the ondansetron (generic for Zofran) for nausea or vomiting.  This medication may not prevent all vomiting or nausea, but can help facilitate better hydration. Things that can help with nausea/vomiting also include peppermint/menthol candies, vitamin B12, and ginger. Promethazine suppository: The suppositories may be used as an alternative to the ondansetron for nausea/vomiting.  They should not be used at the same time. Follow-up: Follow-up with a primary care provider on this matter.  Also recommend follow-up with gastroenterology and urology. Return: Return should you develop a fever, bloody diarrhea, increased abdominal pain, uncontrolled vomiting, or any other major concerns.  For prescription assistance, may try using prescription discount sites or apps, such as goodrx.com

## 2021-07-11 LAB — RAPID URINE DRUG SCREEN, HOSP PERFORMED
Amphetamines: NOT DETECTED
Barbiturates: NOT DETECTED
Benzodiazepines: NOT DETECTED
Cocaine: POSITIVE — AB
Opiates: NOT DETECTED
Tetrahydrocannabinol: POSITIVE — AB

## 2021-07-11 LAB — URINALYSIS, ROUTINE W REFLEX MICROSCOPIC
Bilirubin Urine: NEGATIVE
Glucose, UA: NEGATIVE mg/dL
Hgb urine dipstick: NEGATIVE
Ketones, ur: 5 mg/dL — AB
Leukocytes,Ua: NEGATIVE
Nitrite: NEGATIVE
Protein, ur: NEGATIVE mg/dL
Specific Gravity, Urine: 1.01 (ref 1.005–1.030)
pH: 5 (ref 5.0–8.0)

## 2021-07-11 MED ORDER — ONDANSETRON HCL 4 MG PO TABS: 4.0000 mg | ORAL_TABLET | Freq: Three times a day (TID) | ORAL | 0 refills | Status: AC | PRN

## 2021-07-11 NOTE — ED Provider Notes (Signed)
Care assumed from PA-C Joy  Please see his full H&P.  In short,  Stacy Peterson is a 30 y.o. female presents for 1 mo of abd pain, N/V.  No vomiting in the ED.  No back pain or evidence of urinary retention. Labs reassuring.  Ct scan WNL.  Benign abd exam for initial provider.    Plan: UA and PO trial.    Physical Exam  BP (!) 98/57   Pulse 88   Temp 98.5 F (36.9 C) (Oral)   Resp 16   SpO2 98%   Physical Exam Vitals and nursing note reviewed.  Constitutional:      General: She is not in acute distress.    Appearance: She is well-developed. She is not ill-appearing.  HENT:     Head: Normocephalic.  Eyes:     General: No scleral icterus.    Conjunctiva/sclera: Conjunctivae normal.  Cardiovascular:     Rate and Rhythm: Normal rate.  Pulmonary:     Effort: Pulmonary effort is normal.  Abdominal:     General: There is no distension.  Musculoskeletal:        General: Normal range of motion.     Cervical back: Normal range of motion.  Skin:    General: Skin is warm and dry.  Neurological:     Mental Status: She is alert.  Psychiatric:        Mood and Affect: Mood normal.    ED Course/Procedures   Clinical Course as of 07/11/21 0215  Sun Jul 10, 2021  2230 BP(!): 94/45 Patient lying on her side; BP cuff needed to be adjusted. Repeat 120/72. [SJ]    Clinical Course User Index [SJ] Anselm Pancoast, PA-C    Procedures  Results for orders placed or performed during the hospital encounter of 07/10/21  Resp Panel by RT-PCR (Flu A&B, Covid) Nasopharyngeal Swab   Specimen: Nasopharyngeal Swab; Nasopharyngeal(NP) swabs in vial transport medium  Result Value Ref Range   SARS Coronavirus 2 by RT PCR NEGATIVE NEGATIVE   Influenza A by PCR NEGATIVE NEGATIVE   Influenza B by PCR NEGATIVE NEGATIVE  CBC with Differential  Result Value Ref Range   WBC 5.7 4.0 - 10.5 K/uL   RBC 4.70 3.87 - 5.11 MIL/uL   Hemoglobin 14.7 12.0 - 15.0 g/dL   HCT 94.7 65.4 - 65.0 %   MCV 90.2  80.0 - 100.0 fL   MCH 31.3 26.0 - 34.0 pg   MCHC 34.7 30.0 - 36.0 g/dL   RDW 35.4 65.6 - 81.2 %   Platelets 204 150 - 400 K/uL   nRBC 0.0 0.0 - 0.2 %   Neutrophils Relative % 70 %   Neutro Abs 4.0 1.7 - 7.7 K/uL   Lymphocytes Relative 20 %   Lymphs Abs 1.1 0.7 - 4.0 K/uL   Monocytes Relative 9 %   Monocytes Absolute 0.5 0.1 - 1.0 K/uL   Eosinophils Relative 1 %   Eosinophils Absolute 0.0 0.0 - 0.5 K/uL   Basophils Relative 0 %   Basophils Absolute 0.0 0.0 - 0.1 K/uL   Immature Granulocytes 0 %   Abs Immature Granulocytes 0.01 0.00 - 0.07 K/uL  Comprehensive metabolic panel  Result Value Ref Range   Sodium 137 135 - 145 mmol/L   Potassium 3.4 (L) 3.5 - 5.1 mmol/L   Chloride 100 98 - 111 mmol/L   CO2 23 22 - 32 mmol/L   Glucose, Bld 100 (H) 70 - 99 mg/dL  BUN 9 6 - 20 mg/dL   Creatinine, Ser 7.01 0.44 - 1.00 mg/dL   Calcium 77.9 8.9 - 39.0 mg/dL   Total Protein 7.8 6.5 - 8.1 g/dL   Albumin 4.7 3.5 - 5.0 g/dL   AST 32 15 - 41 U/L   ALT 28 0 - 44 U/L   Alkaline Phosphatase 51 38 - 126 U/L   Total Bilirubin 0.9 0.3 - 1.2 mg/dL   GFR, Estimated >30 >09 mL/min   Anion gap 14 5 - 15  Lipase, blood  Result Value Ref Range   Lipase 31 11 - 51 U/L  Urinalysis, Routine w reflex microscopic  Result Value Ref Range   Color, Urine YELLOW YELLOW   APPearance CLEAR CLEAR   Specific Gravity, Urine 1.010 1.005 - 1.030   pH 5.0 5.0 - 8.0   Glucose, UA NEGATIVE NEGATIVE mg/dL   Hgb urine dipstick NEGATIVE NEGATIVE   Bilirubin Urine NEGATIVE NEGATIVE   Ketones, ur 5 (A) NEGATIVE mg/dL   Protein, ur NEGATIVE NEGATIVE mg/dL   Nitrite NEGATIVE NEGATIVE   Leukocytes,Ua NEGATIVE NEGATIVE  Rapid urine drug screen (hospital performed)  Result Value Ref Range   Opiates NONE DETECTED NONE DETECTED   Cocaine POSITIVE (A) NONE DETECTED   Benzodiazepines NONE DETECTED NONE DETECTED   Amphetamines NONE DETECTED NONE DETECTED   Tetrahydrocannabinol POSITIVE (A) NONE DETECTED   Barbiturates  NONE DETECTED NONE DETECTED  I-Stat Beta hCG blood, ED ([facility], [facility], [facility] only)  Result Value Ref Range   I-stat hCG, quantitative <5.0 <5 mIU/mL   Comment 3           DG Chest 2 View  Result Date: 06/19/2021 CLINICAL DATA:  Chest pain EXAM: CHEST - 2 VIEW COMPARISON:  12/04/2019 FINDINGS: The heart size and mediastinal contours are within normal limits. Both lungs are clear. The visualized skeletal structures are unremarkable. IMPRESSION: No active cardiopulmonary disease. Electronically Signed   By: Helyn Numbers MD   On: 06/19/2021 02:14   CT ABDOMEN PELVIS W CONTRAST  Result Date: 07/11/2021 CLINICAL DATA:  Lower abdominal pain EXAM: CT ABDOMEN AND PELVIS WITH CONTRAST TECHNIQUE: Multidetector CT imaging of the abdomen and pelvis was performed using the standard protocol following bolus administration of intravenous contrast. CONTRAST:  27mL OMNIPAQUE IOHEXOL 350 MG/ML SOLN COMPARISON:  12/03/2021 FINDINGS: Lower chest: Lung bases are clear. Hepatobiliary: Liver is within normal limits, noting focal fat/altered perfusion along the falciform ligament. Gallbladder is unremarkable. No intrahepatic or extrahepatic ductal dilatation. Pancreas: Within normal limits. Spleen: Within normal limits. Adrenals/Urinary Tract: Adrenal glands are within normal limits. Kidneys are within normal limits.  No hydronephrosis. Bladder is within normal limits. Stomach/Bowel: Stomach is within normal limits. No evidence of bowel obstruction. Normal appendix (series 2/image 46). No colonic wall thickening or inflammatory changes. Vascular/Lymphatic: No evidence of abdominal aortic aneurysm. No suspicious abdominopelvic lymphadenopathy. Reproductive: Uterus is within normal limits. Bilateral ovaries are within normal limits. Other: Trace pelvic fluid. Musculoskeletal: Visualized osseous structures are within normal limits. IMPRESSION: Negative CT abdomen/pelvis.  Normal appendix. Electronically Signed   By: Charline Bills M.D.    On: 07/11/2021 00:19     MDM   Pt well appearing.  Wishing to leave. Pending UA.  1:34 AM Patient tolerating p.o. without difficulty.  Remains well-appearing.  UA without evidence of urinary tract infection.  UDS positive for marijuana as has been true in the past.  Suspect this is the cause of her ongoing abdominal pain and vomiting.  She will follow-up  with GI and urology.   Difficulty urinating  Non-intractable vomiting with nausea, unspecified vomiting type        Milta Deiters 07/11/21 0215    Glynn Octave, MD 07/11/21 [phone]

## 2021-07-11 NOTE — ED Notes (Signed)
Pt given Ginger Ale. Pt has been drinking water since 2200

## 2022-12-25 NOTE — L&D Delivery Note (Signed)
Patient was seen in Maternity Assessment Unit, sent over from Main ER after delivery at home. Pt's mother is at bedside and reports that pt is a G7 or G8 -- her duplicate chart shows [phone] status as of 2020. Mom states 2 children live with her and 2 children are in [place].    Pt called mother around 0730-0800 c/o cramping. Pt's sister sent over to pt's residence (currently lives at [facility]), reports SROM around [phone], then delivery of infant around 8:45. Mom arrived to pt's hotel and delivered placenta around 0900, reports providing fundal pressure with delivery of placenta. Placenta left at home. Pt brought to ER, was rerouted to MAU.   On my arrival, pt in significant discomfort. She had bleeding on pad under her, was mostly saturated, no clots. We moved pt over to stretcher. IV started, labs collected, Pitocin given. Ordered of Fentanyl to allow pt to be somewhat comfortable with perineal exam. Perineal exam limited secondary to continued discomfort, but no perineal tears that I was able to appreciate. On fundal rub, had expulsion of 6cm clot. Fundus firm at uterus. Pt in continued pain so Toradol ordered.    Duplicate chart reviewed -- had mAB in 2020, had retained POC. Would have low threshold to re-eval for retained products if has continued bleeding. Social work consult placed as well given hx schizophrenia, bipolar disorder, polysubstance use disorder (+cocaine and +THC on UDS).    Sundra Aland, MD OB Fellow, Faculty [facility], Center for [facility]  11/16/23  11:52 AM

## 2023-11-16 ENCOUNTER — Inpatient Hospital Stay (HOSPITAL_COMMUNITY)
Admission: EM | Admit: 2023-11-16 | Discharge: 2023-11-19 | DRG: 776 | Disposition: A | Discharge status: Home / Self Care | Payer: Medicaid Other | Attending: Obstetrics and Gynecology | Admitting: Obstetrics and Gynecology

## 2023-11-16 ENCOUNTER — Encounter (HOSPITAL_COMMUNITY): Payer: Self-pay | Admitting: Obstetrics and Gynecology

## 2023-11-16 DIAGNOSIS — F209 Schizophrenia, unspecified: Secondary | ICD-10-CM | POA: Diagnosis present

## 2023-11-16 DIAGNOSIS — O0930 Supervision of pregnancy with insufficient antenatal care, unspecified trimester: Secondary | ICD-10-CM

## 2023-11-16 DIAGNOSIS — F319 Bipolar disorder, unspecified: Secondary | ICD-10-CM | POA: Diagnosis present

## 2023-11-16 DIAGNOSIS — F1990 Other psychoactive substance use, unspecified, uncomplicated: Secondary | ICD-10-CM | POA: Diagnosis present

## 2023-11-16 DIAGNOSIS — F1123 Opioid dependence with withdrawal: Secondary | ICD-10-CM | POA: Diagnosis present

## 2023-11-16 DIAGNOSIS — O9081 Anemia of the puerperium: Secondary | ICD-10-CM | POA: Diagnosis present

## 2023-11-16 DIAGNOSIS — O0933 Supervision of pregnancy with insufficient antenatal care, third trimester: Secondary | ICD-10-CM | POA: Diagnosis not present

## 2023-11-16 DIAGNOSIS — F17210 Nicotine dependence, cigarettes, uncomplicated: Secondary | ICD-10-CM | POA: Diagnosis not present

## 2023-11-16 DIAGNOSIS — O99344 Other mental disorders complicating childbirth: Secondary | ICD-10-CM | POA: Diagnosis not present

## 2023-11-16 DIAGNOSIS — G2581 Restless legs syndrome: Secondary | ICD-10-CM | POA: Diagnosis not present

## 2023-11-16 DIAGNOSIS — O99325 Drug use complicating the puerperium: Principal | ICD-10-CM | POA: Diagnosis present

## 2023-11-16 DIAGNOSIS — O99335 Smoking (tobacco) complicating the puerperium: Secondary | ICD-10-CM | POA: Diagnosis present

## 2023-11-16 DIAGNOSIS — R45851 Suicidal ideations: Secondary | ICD-10-CM | POA: Diagnosis present

## 2023-11-16 DIAGNOSIS — O99324 Drug use complicating childbirth: Secondary | ICD-10-CM | POA: Diagnosis not present

## 2023-11-16 DIAGNOSIS — Z3A00 Weeks of gestation of pregnancy not specified: Secondary | ICD-10-CM | POA: Diagnosis not present

## 2023-11-16 DIAGNOSIS — O99345 Other mental disorders complicating the puerperium: Secondary | ICD-10-CM | POA: Diagnosis not present

## 2023-11-16 DIAGNOSIS — D62 Acute posthemorrhagic anemia: Secondary | ICD-10-CM | POA: Diagnosis not present

## 2023-11-16 DIAGNOSIS — O4202 Full-term premature rupture of membranes, onset of labor within 24 hours of rupture: Secondary | ICD-10-CM | POA: Diagnosis not present

## 2023-11-16 DIAGNOSIS — O99334 Smoking (tobacco) complicating childbirth: Secondary | ICD-10-CM | POA: Diagnosis not present

## 2023-11-16 DIAGNOSIS — F419 Anxiety disorder, unspecified: Secondary | ICD-10-CM | POA: Diagnosis present

## 2023-11-16 HISTORY — DX: Schizophrenia, unspecified: F20.9

## 2023-11-16 HISTORY — DX: Bipolar disorder, unspecified: F31.9

## 2023-11-16 HISTORY — DX: Chlamydial infection, unspecified: A74.9

## 2023-11-16 HISTORY — DX: Trichomonal vulvovaginitis: A59.01

## 2023-11-16 HISTORY — DX: Zoster without complications: B02.9

## 2023-11-16 HISTORY — DX: Other psychoactive substance use, unspecified, uncomplicated: F19.90

## 2023-11-16 LAB — TYPE AND SCREEN
ABO/RH(D): O POS
Antibody Screen: NEGATIVE

## 2023-11-16 LAB — DIFFERENTIAL
Abs Immature Granulocytes: 0.04 10*3/uL (ref 0.00–0.07)
Basophils Absolute: 0 10*3/uL (ref 0.0–0.1)
Basophils Relative: 0 %
Eosinophils Absolute: 0 10*3/uL (ref 0.0–0.5)
Eosinophils Relative: 0 %
Immature Granulocytes: 0 %
Lymphocytes Relative: 9 %
Lymphs Abs: 1.4 10*3/uL (ref 0.7–4.0)
Monocytes Absolute: 0.6 10*3/uL (ref 0.1–1.0)
Monocytes Relative: 4 %
Neutro Abs: 13 10*3/uL — ABNORMAL HIGH (ref 1.7–7.7)
Neutrophils Relative %: 87 %

## 2023-11-16 LAB — COMPREHENSIVE METABOLIC PANEL
ALT: 13 U/L (ref 0–44)
AST: 19 U/L (ref 15–41)
Albumin: 2.9 g/dL — ABNORMAL LOW (ref 3.5–5.0)
Alkaline Phosphatase: 153 U/L — ABNORMAL HIGH (ref 38–126)
Anion gap: 10 (ref 5–15)
BUN: 9 mg/dL (ref 6–20)
CO2: 20 mmol/L — ABNORMAL LOW (ref 22–32)
Calcium: 9.7 mg/dL (ref 8.9–10.3)
Chloride: 104 mmol/L (ref 98–111)
Creatinine, Ser: 0.7 mg/dL (ref 0.44–1.00)
GFR, Estimated: >60 mL/min (ref >60)
Glucose, Bld: 122 mg/dL — ABNORMAL HIGH (ref 70–99)
Potassium: 3.7 mmol/L (ref 3.5–5.1)
Sodium: 134 mmol/L — ABNORMAL LOW (ref 135–145)
Total Bilirubin: 0.6 mg/dL (ref <1.2)
Total Protein: 6.2 g/dL — ABNORMAL LOW (ref 6.5–8.1)

## 2023-11-16 LAB — HIV ANTIBODY (ROUTINE TESTING W REFLEX): HIV Screen 4th Generation wRfx: NONREACTIVE

## 2023-11-16 LAB — RPR: RPR Ser Ql: NONREACTIVE

## 2023-11-16 LAB — HEPATITIS C ANTIBODY: HCV Ab: NONREACTIVE

## 2023-11-16 LAB — CBC
HCT: 30.5 % — ABNORMAL LOW (ref 36.0–46.0)
Hemoglobin: 10.4 g/dL — ABNORMAL LOW (ref 12.0–15.0)
MCH: 30.2 pg (ref 26.0–34.0)
MCHC: 34.1 g/dL (ref 30.0–36.0)
MCV: 88.7 fL (ref 80.0–100.0)
Platelets: 235 10*3/uL (ref 150–400)
RBC: 3.44 MIL/uL — ABNORMAL LOW (ref 3.87–5.11)
RDW: 13.7 % (ref 11.5–15.5)
WBC: 15 10*3/uL — ABNORMAL HIGH (ref 4.0–10.5)
nRBC: 0 % (ref 0.0–0.2)

## 2023-11-16 LAB — RAPID HIV SCREEN (HIV 1/2 AB+AG)
HIV 1/2 Antibodies: NONREACTIVE
HIV-1 P24 Antigen - HIV24: NONREACTIVE

## 2023-11-16 LAB — HEPATITIS B SURFACE ANTIGEN: Hepatitis B Surface Ag: NONREACTIVE

## 2023-11-16 MED ORDER — SENNOSIDES-DOCUSATE SODIUM 8.6-50 MG PO TABS
2.0000 | ORAL_TABLET | Freq: Every day | ORAL | Status: DC
Start: 2023-11-17 — End: 2023-11-19
  Administered 2023-11-18 – 2023-11-19 (×2): 2 via ORAL
  Filled 2023-11-16 (×2): qty 2

## 2023-11-16 MED ORDER — ONDANSETRON HCL 4 MG PO TABS: 4.0000 mg | ORAL_TABLET | ORAL | Status: DC | PRN

## 2023-11-16 MED ORDER — PRENATAL MULTIVITAMIN CH
1.0000 | ORAL_TABLET | Freq: Every day | ORAL | Status: DC
  Administered 2023-11-18 – 2023-11-19 (×2): 1 via ORAL
  Filled 2023-11-16 (×2): qty 1

## 2023-11-16 MED ORDER — KETOROLAC TROMETHAMINE 30 MG/ML IJ SOLN
30.0000 mg | Freq: Once | INTRAMUSCULAR | Status: AC
  Administered 2023-11-16: 30 mg via INTRAVENOUS
  Filled 2023-11-16: qty 1

## 2023-11-16 MED ORDER — COCONUT OIL OIL: 1.0000 | TOPICAL_OIL | Status: DC | PRN

## 2023-11-16 MED ORDER — ACETAMINOPHEN 325 MG PO TABS
650.0000 mg | ORAL_TABLET | ORAL | Status: DC | PRN
  Administered 2023-11-16: 650 mg via ORAL
  Filled 2023-11-16: qty 2

## 2023-11-16 MED ORDER — BENZOCAINE-MENTHOL 20-0.5 % EX AERO
1.0000 | INHALATION_SPRAY | CUTANEOUS | Status: DC | PRN
  Administered 2023-11-17: 1 via TOPICAL
  Filled 2023-11-16: qty 56

## 2023-11-16 MED ORDER — ONDANSETRON HCL 4 MG/2ML IJ SOLN: 4.0000 mg | INTRAMUSCULAR | Status: DC | PRN

## 2023-11-16 MED ORDER — WITCH HAZEL-GLYCERIN EX PADS: 1.0000 | MEDICATED_PAD | CUTANEOUS | Status: DC | PRN

## 2023-11-16 MED ORDER — DIPHENHYDRAMINE HCL 25 MG PO CAPS: 25.0000 mg | ORAL_CAPSULE | Freq: Four times a day (QID) | ORAL | Status: DC | PRN

## 2023-11-16 MED ORDER — IBUPROFEN 600 MG PO TABS
600.0000 mg | ORAL_TABLET | Freq: Four times a day (QID) | ORAL | Status: DC
  Administered 2023-11-16 – 2023-11-19 (×5): 600 mg via ORAL
  Filled 2023-11-16 (×7): qty 1

## 2023-11-16 MED ORDER — DIBUCAINE (PERIANAL) 1 % EX OINT: 1.0000 | TOPICAL_OINTMENT | CUTANEOUS | Status: DC | PRN

## 2023-11-16 MED ORDER — TETANUS-DIPHTH-ACELL PERTUSSIS 5-2.5-18.5 LF-MCG/0.5 IM SUSY: 0.5000 mL | PREFILLED_SYRINGE | Freq: Once | INTRAMUSCULAR | Status: DC

## 2023-11-16 MED ORDER — OXYTOCIN-SODIUM CHLORIDE 30-0.9 UT/500ML-% IV SOLN
INTRAVENOUS | Status: AC
  Filled 2023-11-16: qty 500

## 2023-11-16 MED ORDER — OXYTOCIN 10 UNIT/ML IJ SOLN
INTRAMUSCULAR | Status: AC
  Filled 2023-11-16: qty 1

## 2023-11-16 MED ORDER — SIMETHICONE 80 MG PO CHEW: 80.0000 mg | CHEWABLE_TABLET | ORAL | Status: DC | PRN

## 2023-11-16 MED ORDER — FENTANYL CITRATE (PF) 100 MCG/2ML IJ SOLN
INTRAMUSCULAR | Status: AC
  Filled 2023-11-16: qty 2

## 2023-11-16 MED ORDER — FENTANYL CITRATE (PF) 100 MCG/2ML IJ SOLN
100.0000 ug | Freq: Once | INTRAMUSCULAR | Status: AC
  Administered 2023-11-16: 100 ug via INTRAVENOUS

## 2023-11-16 MED ORDER — SODIUM CHLORIDE 0.9% FLUSH: 10.0000 mL | Freq: Two times a day (BID) | INTRAVENOUS | Status: DC

## 2023-11-16 MED ORDER — ZOLPIDEM TARTRATE 5 MG PO TABS
5.0000 mg | ORAL_TABLET | Freq: Every evening | ORAL | Status: DC | PRN
  Administered 2023-11-17: 5 mg via ORAL
  Filled 2023-11-16: qty 1

## 2023-11-16 NOTE — Progress Notes (Signed)
Brief Progress Note  Patient was seen in Maternity Assessment Unit, sent over from Main ER after delivery at home. Pt's mother is at bedside and reports that pt is a G7 or G8 -- her duplicate chart shows [phone] status as of 2020. Mom states 2 children live with her and 2 children are in [place].   Pt called mother around 0730-0800 c/o cramping. Pt's sister sent over to pt's residence (currently lives at [facility]), reports SROM around [phone], then delivery of infant around 8:45. Mom arrived to pt's hotel and delivered placenta around 0900, reports providing fundal pressure with delivery of placenta. Placenta left at home. Pt brought to ER, was rerouted to MAU.  On my arrival, pt in significant discomfort. She had bleeding on pad under her, was mostly saturated, no clots. We moved pt over to stretcher. IV started, labs collected, Pitocin given. Ordered of Fentanyl to allow pt to be somewhat comfortable with perineal exam. Perineal exam limited secondary to continued discomfort, but no perineal tears that I was able to appreciate. On fundal rub, had expulsion of 6cm clot. Fundus firm at uterus. Pt in continued pain so Toradol ordered.   Duplicate chart reviewed -- had mAB in 2020, had retained POC. Would have low threshold to re-eval for retained products if has continued bleeding. Social work consult placed as well given hx schizophrenia, bipolar disorder, polysubstance use disorder (+cocaine and +THC on UDS).   Sundra Aland, MD OB Fellow, Faculty [facility], Center for [facility] Healthcare  11/16/23  11:52 AM

## 2023-11-16 NOTE — Progress Notes (Signed)
Pt is declining fundal assessments at this time and has requested her IV be removed. Pt educated on the reason for having an IV postpartum and still requests IV to be removed. RN removed IV per pt request. RN educated pt on the reason for fundal assessments and pt states she will notify RN if she feels like her bleeding is excessive.

## 2023-11-16 NOTE — H&P (Addendum)
OBSTETRIC ADMISSION HISTORY AND PHYSICAL  Quanisha Piasecki is a 32 y.o. female ?G7 or G8 P5 per pt's mother s/p SVD at unknown gestation. Pt did not know she was pregnant, no prenatal care this pregnancy.   Patient was seen in Maternity Assessment Unit, sent over from Main ER after delivery at home. Pt's mother is at bedside and reports that pt is a G7 or G8 -- her duplicate chart shows [phone] status as of 2020. Mom states 2 children live with her and 2 children are in [place].    Pt called mother around 0730-0800 c/o cramping. Pt's sister sent over to pt's residence (currently lives at [facility]), reports SROM around [phone], then delivery of infant around 8:45. Mom arrived to pt's hotel and delivered placenta around 0900, reports providing fundal pressure with delivery of placenta. Placenta left at home. Pt brought to ER, was rerouted to MAU.  Unable to obtain hx from pt due to pt distress.  Past Medical History: - hx preterm labor - schizophrenia - bipolar 1 disorder - polysubstance use - shingles  Past Surgical History:  - D&E - Knee surgery  Obstetrical History: Per mother, G7 or G8, P5  Social History - Endorses tobacco use hx - No alcohol use - Possible THC use per pt's mother  Family History: No family history on file.  Allergies: No Known Allergies  No medications prior to admission.   Review of Systems  All systems reviewed and negative except as stated in HPI.  unknown if currently breastfeeding. General appearance: moderate distress, thin female Heart: reg rate Resp: breathing comfortably Abdominal exam: fundus firm at uterus Perineum: no tears seen on limited exam  Lower extremities: no edema    Prenatal labs: pending Prenatal Transfer Tool  Maternal Diabetes: unknown Genetic Screening: n/a Maternal Ultrasounds/Referrals: none Fetal Ultrasounds or other Referrals:  none Maternal Substance Abuse:  Yes:  Type: Smoker, Marijuana Significant Maternal  Medications:  None Significant Maternal Lab Results:  Other: labs pending Number of Prenatal Visits: no prenatal care Other Comments:  None  Results for orders placed or performed during the hospital encounter of 11/16/23 (from the past 24 hour(s))  Type and screen [facility]   Collection Time: 11/16/23 10:57 AM  Result Value Ref Range   ABO/RH(D) PENDING    Antibody Screen PENDING    Sample Expiration      11/19/2023,2359 Performed at [facility], [address], [place], [place] [address]   CBC   Collection Time: 11/16/23 11:04 AM  Result Value Ref Range   WBC 15.0 (H) 4.0 - 10.5 K/uL   RBC 3.44 (L) 3.87 - 5.11 MIL/uL   Hemoglobin 10.4 (L) 12.0 - 15.0 g/dL   HCT 74.2 (L) 59.5 - 63.8 %   MCV 88.7 80.0 - 100.0 fL   MCH 30.2 26.0 - 34.0 pg   MCHC 34.1 30.0 - 36.0 g/dL   RDW 75.6 43.3 - 29.5 %   Platelets 235 150 - 400 K/uL   nRBC 0.0 0.0 - 0.2 %  Differential   Collection Time: 11/16/23 11:04 AM  Result Value Ref Range   Neutrophils Relative % 87 %   Neutro Abs 13.0 (H) 1.7 - 7.7 K/uL   Lymphocytes Relative 9 %   Lymphs Abs 1.4 0.7 - 4.0 K/uL   Monocytes Relative 4 %   Monocytes Absolute 0.6 0.1 - 1.0 K/uL   Eosinophils Relative 0 %   Eosinophils Absolute 0.0 0.0 - 0.5 K/uL  Basophils Relative 0 %   Basophils Absolute 0.0 0.0 - 0.1 K/uL   Immature Granulocytes 0 %   Abs Immature Granulocytes 0.04 0.00 - 0.07 K/uL    Patient Active Problem List   Diagnosis Date Noted   Normal labor and delivery 11/16/2023    Assessment/Plan:  Marinna Chorney is a 31 y.o. G7 or G8, P5 here s/p SVD at home.  #Postpartum: Routine care, low threshold to image for retained POC as placenta delivered at home, limited perineal exam able to be performed  #No prenatal care: SW consult and serum UDS placed  #Hx polysubstance use disorder  #Hx bipolar disorder/schizophrenia: no meds per pt's mother's report  Sundra Aland, MD OB Fellow, Faculty Practice [facility], Center for [facility] Healthcare 11/16/23 11:55 AM

## 2023-11-16 NOTE — MAU Note (Signed)
..  Stacy Peterson is a 32 y.o. at Unknown here in MAU reporting: brought over from the ED, patient delivered at home and was unaware that she was pregnant. Infant is a vigorous female. Patient states that she is in severe pain and is bleeding through her clothes onto the wheelchair. Dr. Lucianne Muss and [place] at bedside.   Pain score: 10 Vitals:   11/16/23 1202 11/16/23 1249  BP: 119/67 119/88  Pulse: 74 89  Resp:  19  Temp: 97.8 F (36.6 C)   SpO2:

## 2023-11-16 NOTE — Social Work (Signed)
CSW acknowledged consult and completed a clinical assessment.    Clinical assessment notes will be entered at a later time.  Wende Neighbors, LCSWA Clinical Social Worker [phone]

## 2023-11-16 NOTE — MAU Note (Signed)
.  Stacy Peterson is a 32 y.o. at Unknown here in MAU reporting: Brought over from the ED after home delivery. Patient hunched over in the wheelchair being wheeled by ED RN groaning. Patient's mother walking behind her with baby wrapped in several blankets. Patient brought to room 1S20. Patient's mother stated the patient did not know she was pregnant and delivered at home around 0800 this morning with the placenta being delivered around 15-20 minutes later. Patient not responding to questions. Patient's mother speaking for her.  This RN, Imagene Sheller, RN, and Don Broach, MD at bedside.  This RN placed infant in warmer and obtained VS. Umbilical cord noted to be clamped with binder clip. Initial newborn VS 97.9 F axillary, RR 66, and HR 152 at 1037.  This RN called Davina Poke, RROB, and Veterinary surgeon at [phone]. RROB RN en route for assistance. NICU Charge RN stated she would let the NP know of infants arrival for evaluation. NICU RN arrived to unit as well as [place], DO. VS obtained again by NICU RN. 97.3 F axillary temp noted. DO stated to leave baby in warmer. Temperature probe placed on infants chest. Pulse ox applied to right radial.  Hugs tag supplied by NICU RN.  LMP: unsure Onset of complaint: 0800 Pain score: 10/10 lower abdomen

## 2023-11-17 ENCOUNTER — Encounter (HOSPITAL_COMMUNITY): Payer: Self-pay | Admitting: Obstetrics and Gynecology

## 2023-11-17 ENCOUNTER — Encounter: Payer: Self-pay | Admitting: Obstetrics & Gynecology

## 2023-11-17 DIAGNOSIS — O0930 Supervision of pregnancy with insufficient antenatal care, unspecified trimester: Secondary | ICD-10-CM

## 2023-11-17 DIAGNOSIS — F209 Schizophrenia, unspecified: Secondary | ICD-10-CM | POA: Diagnosis present

## 2023-11-17 DIAGNOSIS — F319 Bipolar disorder, unspecified: Secondary | ICD-10-CM | POA: Diagnosis present

## 2023-11-17 DIAGNOSIS — F1990 Other psychoactive substance use, unspecified, uncomplicated: Secondary | ICD-10-CM | POA: Diagnosis present

## 2023-11-17 LAB — CBC
HCT: 21.5 % — ABNORMAL LOW (ref 36.0–46.0)
Hemoglobin: 7.4 g/dL — ABNORMAL LOW (ref 12.0–15.0)
MCH: 30.8 pg (ref 26.0–34.0)
MCHC: 34.4 g/dL (ref 30.0–36.0)
MCV: 89.6 fL (ref 80.0–100.0)
Platelets: 189 10*3/uL (ref 150–400)
RBC: 2.4 MIL/uL — ABNORMAL LOW (ref 3.87–5.11)
RDW: 13.8 % (ref 11.5–15.5)
WBC: 7.5 10*3/uL (ref 4.0–10.5)
nRBC: 0 % (ref 0.0–0.2)

## 2023-11-17 MED ORDER — LOPERAMIDE HCL 2 MG PO CAPS
4.0000 mg | ORAL_CAPSULE | Freq: Once | ORAL | Status: AC
Start: 2023-11-17 — End: 2023-11-17
  Administered 2023-11-17: 4 mg via ORAL
  Filled 2023-11-17: qty 2

## 2023-11-17 MED ORDER — DIPHENHYDRAMINE HCL 25 MG PO CAPS
25.0000 mg | ORAL_CAPSULE | Freq: Four times a day (QID) | ORAL | Status: DC | PRN
  Administered 2023-11-17: 25 mg via ORAL
  Filled 2023-11-17: qty 1

## 2023-11-17 MED ORDER — NICOTINE 21 MG/24HR TD PT24
21.0000 mg | MEDICATED_PATCH | Freq: Every day | TRANSDERMAL | Status: DC
  Administered 2023-11-17: 21 mg via TRANSDERMAL
  Filled 2023-11-17 (×3): qty 1

## 2023-11-17 MED ORDER — FERROUS SULFATE 325 (65 FE) MG PO TABS
325.0000 mg | ORAL_TABLET | Freq: Every day | ORAL | Status: DC
  Administered 2023-11-18 – 2023-11-19 (×2): 325 mg via ORAL
  Filled 2023-11-17 (×2): qty 1

## 2023-11-17 MED ORDER — HYDROXYZINE HCL 25 MG PO TABS
25.0000 mg | ORAL_TABLET | Freq: Three times a day (TID) | ORAL | Status: DC | PRN
  Administered 2023-11-17: 25 mg via ORAL
  Filled 2023-11-17: qty 1

## 2023-11-17 MED ORDER — CYCLOBENZAPRINE HCL 5 MG PO TABS
10.0000 mg | ORAL_TABLET | Freq: Three times a day (TID) | ORAL | Status: DC | PRN
  Administered 2023-11-17 – 2023-11-18 (×3): 10 mg via ORAL
  Filled 2023-11-17 (×3): qty 2

## 2023-11-17 MED ORDER — OXYCODONE HCL 5 MG PO TABS: 10.0000 mg | ORAL_TABLET | ORAL | Status: DC | PRN

## 2023-11-17 MED ORDER — BUPRENORPHINE HCL-NALOXONE HCL 8-2 MG SL SUBL
1.0000 | SUBLINGUAL_TABLET | Freq: Three times a day (TID) | SUBLINGUAL | Status: DC
  Administered 2023-11-18 – 2023-11-19 (×4): 1 via SUBLINGUAL
  Filled 2023-11-17 (×4): qty 1

## 2023-11-17 MED ORDER — BUPRENORPHINE HCL-NALOXONE HCL 8-2 MG SL SUBL
1.0000 | SUBLINGUAL_TABLET | SUBLINGUAL | Status: DC | PRN
  Administered 2023-11-17: 1 via SUBLINGUAL
  Filled 2023-11-17 (×2): qty 1

## 2023-11-17 MED ORDER — OXYCODONE HCL 5 MG PO TABS
5.0000 mg | ORAL_TABLET | ORAL | Status: DC | PRN
  Administered 2023-11-17: 5 mg via ORAL
  Filled 2023-11-17: qty 1

## 2023-11-17 MED ORDER — DIPHENHYDRAMINE HCL 50 MG/ML IJ SOLN: 25.0000 mg | Freq: Four times a day (QID) | INTRAMUSCULAR | Status: DC | PRN

## 2023-11-17 MED ORDER — LOPERAMIDE HCL 2 MG PO CAPS: 4.0000 mg | ORAL_CAPSULE | ORAL | Status: DC | PRN

## 2023-11-17 MED ORDER — GABAPENTIN 300 MG PO CAPS
300.0000 mg | ORAL_CAPSULE | Freq: Three times a day (TID) | ORAL | Status: DC
  Administered 2023-11-17 – 2023-11-19 (×5): 300 mg via ORAL
  Filled 2023-11-17 (×6): qty 1

## 2023-11-17 MED ORDER — LOPERAMIDE HCL 2 MG PO CAPS: 2.0000 mg | ORAL_CAPSULE | ORAL | Status: DC | PRN

## 2023-11-17 MED ORDER — BUPRENORPHINE HCL-NALOXONE HCL 8-2 MG SL SUBL
1.0000 | SUBLINGUAL_TABLET | Freq: Once | SUBLINGUAL | Status: AC
  Administered 2023-11-17: 1 via SUBLINGUAL

## 2023-11-17 MED ORDER — BUPRENORPHINE HCL-NALOXONE HCL 8-2 MG SL SUBL
2.0000 | SUBLINGUAL_TABLET | Freq: Once | SUBLINGUAL | Status: AC
  Administered 2023-11-17: 2 via SUBLINGUAL
  Filled 2023-11-17: qty 2

## 2023-11-17 NOTE — Social Work (Signed)
CSW escorted CPS Social Worker Pia Mau to [name]'s room.  Wende Neighbors, LCSWA Clinical Social Worker [phone]

## 2023-11-17 NOTE — Social Work (Signed)
Per [facility] CPS, a safety plan is in place, [name] will have CFT on Monday, infant is to remain in the hospital until then. [name] is not to be left alone in the room with the infant, [name] can be present in the room with the infant as long as MGM is present. When MGM leaves infant is to go to the nursery. [name] and MGM agreed to and are aware of this plan.   Barriers to infants discharge at this time.  Wende Neighbors, LCSWA Clinical Social Worker [phone]

## 2023-11-17 NOTE — Progress Notes (Signed)
Post Partum Day 1 s/p SVD Subjective: up ad lib, voiding, and tolerating PO Feels tired  Objective: Blood pressure 101/67, pulse 72, temperature 98.2 F (36.8 C), temperature source Oral, resp. rate 18, SpO2 99%, unknown if currently breastfeeding.  Physical Exam:  General: sleeping in bed, easily woken up but not engaging in conversation Lochia: appropriate Uterine Fundus: not assessed Incision: n/a DVT Evaluation: No evidence of DVT seen on physical exam.  Recent Labs    11/16/23 1104 11/17/23 0450  HGB 10.4* 7.4*  HCT 30.5* 21.5*    Assessment/Plan: Postpartum - Contraception: declines - MOF: Formula - Rh status: Rh+ - Rubella status: pending - Dispo: likely discharge PPD2 - Consults: SW  Neonatal - Doing well at bedside - Circumcision: will readdress  3. Bipolar disorder, schizophrenia, substance use disorder - SW consulted - UDS pending - Will readdress this afternoon when patient more alert  4. Acute blood loss anemia - denies si/sx anemia - po iron ordered   LOS: 1 day   Lennart Pall 11/17/2023, 1:02 PM

## 2023-11-17 NOTE — Progress Notes (Signed)
Patient is declining fundal checks.  Patient reports her bleeding is minimal and no clots. Patient educated on the reason for fundal assessments and encouraged to notify RN for clots or excessive bleeding.

## 2023-11-17 NOTE — Progress Notes (Signed)
Patient states "Oh my God I want to die" PT requests RN to cut her skin off because she can't take the her skin crawling. Patient refuses to take meds. RN called DR Kerin Perna  and is able to come to bedside and perform a fundal check after pt had screamed at RN to "get the fuck away from me and get the fuck out of my room" Notified SW and MD of patient request.Dr Kerin Perna was able with RN in the room assess patient after both RN and MD taught her breathing exercises. Pt still very agitated and is continuing to refuse meds. RN explained the importance of giving meds as ordered. Pt flailing arms and legs unable to be still. Siderails up X 4. Will continue to monitor.

## 2023-11-17 NOTE — Clinical Social Work Maternal (Signed)
CLINICAL SOCIAL WORK MATERNAL/CHILD NOTE  Patient Details  Name: Stacy Peterson MRN: [id number] Date of Birth: 05-08-1991  Date:  11/17/2023  Clinical Social Worker Initiating Note:  Willaim Rayas Wilfredo Canterbury Date/Time: Initiated:  11/16/23/1700     Child's Name:  unknown 11/16/2023   Biological Parents:  Mother Kerriana Kaina [birth year] 25[birth year] 1992)   Need for Interpreter:  None   Reason for Referral:  Late or No Prenatal Care  , Current Substance Use/Substance Use During Pregnancy  , Behavioral Health Concerns   Address:  [address] [place] [address]    Phone number:  [phone] (home)     Additional phone number:   Household Members/Support Persons (HM/SP):       HM/SP Name Relationship DOB or Age  HM/SP -1        HM/SP -2        HM/SP -3        HM/SP -4        HM/SP -5        HM/SP -6        HM/SP -7        HM/SP -8          Natural Supports (not living in the home):  Parent   Professional Supports: None   Employment: Part-time   Type of Work: Paramedic work for [employer]   Education:  Some Materials engineer arranged:    Surveyor, quantity Resources:  Medicaid   Other Resources:      Cultural/Religious Considerations Which May Impact Care:    Strengths:  Compliance with medical plan  , Pediatrician chosen   Psychotropic Medications:         Pediatrician:    Armed forces operational officer area  Pediatrician List:   [facility]  [place]    [place]    [facility]      Pediatrician Fax Number:    Risk Factors/Current Problems:  Substance Use  , Mental Health Concerns     Cognitive State:  Alert  , Able to Concentrate     Mood/Affect:  Relaxed  , Comfortable     CSW Assessment: CSW received a consult for no prenatal care, [name] hx of schizophrenia, Bipolar and hx of substance use. CSW met with [name] to complete assessment and offer support. CSW entered the room and observed [name] in bed holding  the infant and MGM at bedside. CSW introduced self, CSW role and reason for visit, [name] was agreeable to visit and allowed MGM to remain in the room during the assessment. [name] was pleasant and cooperative during the assessment. CSW inquired about how [name] was feeling. [name] reported she was tired CSW confirmed [name]'s address [name] reported she is not at the address listed on the chart, [name] reported she was living at the [facility] and reported she does not have a phone right now so the best way to contact her is through [facility] at [phone].   CSW inquired about [name] MH hx, [name] reported she was diagnosed as a teen and reports she is currently not receiving any treatment for her mental health. [name] reported she journals and leans on her family for support. [name] reported a stable mental mood and denied MH resources CSW offered. [name] reported she is familiar with [facility] [place] for support. CSW provided education regarding the baby blues period vs. perinatal mood disorders, discussed treatment and gave resources for mental health follow  up if concerns arise.  CSW recommends self-evaluation during the postpartum time period using the New Mom Checklist from Postpartum Progress and encouraged [name] to contact a medical professional if symptoms are noted at any time. [name] identified her mom and sister as her supports.   CSW inquired about [name] lack of prenatal care, [name] reported she did not know she was pregnancy and delivered the baby in the hotel room. [name] reported her mom came and helped deliver the placenta then she was brought tot he hospital. CSW explained the hospital drug screen policy due to lack of prenatal care. [name] verbalized understanding. CSW inquired about [name]'s hx of substance use, [name] denied any substance use during pregnancy. [name] reported she only used THC and her last use was at the beginning of the year. CSW informed [name] there would be a UDS and CDS collected for the infant,  and  CPS report  would be made if warranted, [name] verbalize understanding. CSW inquired about CPS hx, [name] denied any CPS hx. CSW inquired about the whereabouts of her 4 other children. [name] reported the oldest 2 live with her mom and the younger 2 were adopted by two separate families in [place].   CSW provided review of Sudden Infant Death Syndrome (SIDS) precautions.  [name] reported she does not have any supplies for the infant since she was unaware that she was pregnant. CSW notified her the hospital may be able to help with some infant supplies.   -CPS report made due to infants UDS, [name] MH hx, and lack of housing  Barriers to infants discharge, awaiting plan from [facility] CPS.   CSW Plan/Description:  Psychosocial Support and Ongoing Assessment of Needs, Perinatal Mood and Anxiety Disorder (PMADs) Education, Sudden Infant Death Syndrome (SIDS) Education, Hospital Drug Screen Policy Information, Child Protective Service Report  , CSW Awaiting CPS Disposition Plan, CSW Will Continue to Monitor Umbilical Cord Tissue Drug Screen Results and Make Report if Warranted, Neonatal Abstinence Syndrome (NAS) Education    Maud Deed, LCSW 11/17/2023, 10:50 AM

## 2023-11-17 NOTE — Progress Notes (Signed)
Spoke with nursing and patient still scoring a 26 at the one hours recheck so was given Suboxone 8mg  x1.  It is now past one hour and patient's COW scores 26>26>22.  Per Dr. Crissie Reese would only give one more dose of Suboxone 8mg , place patient on continuous pulse ox (though respiratory depression risk low) for a max of 32mg  today.  Patient will not be able to receive any more suboxone today.  She will be written for Suboxone 8mg  TID tomorrow (11/18/23).    Of note, also told by nursing that patient verbalizing active SI.  Placed sitter order, psych consult (paged Ancil Linsey at [phone]), transition care consult, spiritual consult per MAT order set.    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 Audie Clear recommendations  Suboxone 16mg  > 8mg  > 8mg  (32mg  max) 11/23  PRNs for symptoms (Flexeril, Benadryl, Zofran, Immodium)  Suboxone 8mg  TID to start 11/18/23 - f/u UDS   #Active SI #Bipolar, un-medicated - psych consult, to see AM 11/24 - if she threatens to leave AMA would likely need white papers   Mittie Bodo, MD Family Medicine - Obstetrics Fellow \

## 2023-11-17 NOTE — Progress Notes (Signed)
Called to bedside out of patient request that she is feeling as though she is withdrawing.  Pt's pending however baby tested positive for opiates and amphetamines.  VSS however patient COW scoring 26.  Spoke with patient and she states most worriesome symptoms are the restlessness and anxiety.  Also having N/V, loose stools and rhinorrhea.  Discussed Methadone with patient and she states though she has a transportation she doesn't know much about Methadone and states she tried to get on it previously but it didn't work out.  She is agreeable to trying the Suboxone.  Consulted Dr. Crissie Reese for recommendations and the plan is as follows:  Administered 16mg  now with plan to reassess in .  If symptoms still not controlled will give another 8mg  with plans to reassess later.  Also giving Flexeril and Atarax for restlessness / cramping and anxiety.  Ordered Imodium for loose stools, Zofran for N/V.   Mittie Bodo, MD Family Medicine - Obstetrics Fellow

## 2023-11-17 NOTE — Progress Notes (Signed)
Update:   Pt no longer thought to be actively SI as more verbalization of wanting to die given the symptoms / feeling of withdrawal she was experiencing.  No need for sitter at this time unless suspicious activity witnessed from staffing. However, will keep psych consult and appreciate their recommendations on pt in the morning. She was noted to be downstairs without approval and staffing concerned about elopement.  Met with patient downstairs and patient agreeable to stay for further evaluation / treatment if allowed to walk the unit.  She is agreeable to not leave the unit without coordination of charge nursing and security officers.  She insists on being allowed to have the occasional cigarette and agreeable to be chaperoned by security to do so.  She also understands this is a luxury and dependent on both nursing and security availability.  She also understands if she decides to leave it would be against medical advice. Pt does not currently have pIV in place but is wearing continuous pulse ox.  At this time patient agreeable to stay and continue treatment with the above agreements.  Nursing leadership and security also aware and agreeable, which is much appreciated.   Mittie Bodo, MD Family Medicine - Obstetrics Fellow

## 2023-11-17 NOTE — Progress Notes (Signed)
RN entered the room and asked her if she was able to get some sleep per Request to be left alone. RN checked the baby and approached the mom to do the assessment for pain and fundal checks. Patient started screaming at RN to "leave me the fuck alone" RN asked grandmother about infants last feeding and she stated she just arrived. Notified SW of mothers behavior. Patient states she wants to sleep and her skin is crawling notified Dr Kerin Perna. Patient does not look at her baby or seem interested at all in any care for the baby. Will continue to monitor.
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
## 2023-11-17 NOTE — Plan of Care (Signed)
Multiple issues as barrier to discharge. Pt states she has not taken her medications for bipolar or schizophrenia since she was 13. Patient states she wants to kill herself if we can't cut her skin off. Patient states she feels sick. Pt scoring 22-26 on her COW score for drug withdrawal. Suboxone order obtained from MD. Patient is progressing but complains of needing to smoke. RN educated her that she is also withdrawing from nicotine. RN was finally allowed to give her a nicotine patch per patient after refusing her meds all day. RN strongly encourage her to take her meds as ordered.

## 2023-11-18 LAB — RUBELLA SCREEN: Rubella: 1.04 {index} (ref >0.99)

## 2023-11-18 NOTE — Consult Note (Signed)
[facility] Face-to-Face Psychiatry Consult   Reason for Consult:  suicidal comments Referring Physician:  Dr. Mariel Aloe Patient Identification: Stacy Peterson MRN:  [id number] Principal Diagnosis: Vaginal delivery Diagnosis:  Principal Problem:   Vaginal delivery at home Active Problems:   Substance use disorder   Schizophrenia (HCC)   Bipolar 1 disorder (HCC)   No prenatal care in current pregnancy   Total Time spent with patient: 1 hour  Subjective:   Stacy Peterson is a 32 y.o. female patient admitted with Vaginal delivery at home.  HPI: Patient was interviewed and chart was reviewed this morning.  Patient reports yesterday she was feeling very sick and and stated the Suboxone precipitated withdrawal for her yesterday.  Because she was feeling so uncomfortable yesterday she made a suicidal comment "I am just ready to die, kill me already, put me to sleep".  Today patient reports she is feeling much better and her mood is "okay".  Patient denies current suicidal ideation or homicidal ideation or auditory or visual hallucinations.  Patient reports the last time she truly felt depressed was in December 24, 2019 after her brother died.  Patient reports good sleep and appetite.  I spoke to patient's mother Stacy Peterson this morning for collateral information.  Mother denies that patient has ever attempted suicide.  Mother agrees that patient was withdrawing yesterday which is why she made those suicidal comments.  Mother does not feel patient was truly suicidal.  Mother states patient was diagnosed with bipolar disorder at the age of 32 years old and was prescribed Xanax then briefly but stopped taking it due to side effects.  Patient has not taken any psychotropic medications since the age of 59.  Mother does not feel that patient is a threat to herself or others at this time.  However mother was not able to take patient home because she has to take care of of the newborn baby as well as patient's 2 other kids aged  14 and 33 years old.  Mother feels patient needs to go to a rehabilitation facility.  Past Psychiatric History: Patient denies any history of suicide attempts or outpatient psychiatrist.  Patient has not taken psychotropic medications in many years.  Patient reports being admitted to [facility] age at the age of 32 years old but she is unsure why, and she was diagnosed with bipolar disorder and prescribed several medications including Wellbutrin clonidine and Xanax.  However the medications cause side effects so she did not take it after the hospitalization.  Patient also states she was admitted to [facility] age in 12/24/2019 when her brother died and she was feeling depressed.  Risk to Self:  None reported Risk to Others:  None reported Prior Inpatient Therapy:  [facility] in December 24, 2019 and at the age of 41 Prior Outpatient Therapy:  None reported  Substance abuse history: Patient has been using fentanyl 1/2 g every 24 hours since February 2024.  She started using this with her boyfriend who she was living with at that time.  Patient tried using cocaine with him for 1 week in February but did not like it so has only been using fentanyl since then.  Patient denies using amphetamines.  Past Medical History:  Past Medical History:  Diagnosis Date   Bipolar 1 disorder (HCC)    Chlamydia    Schizophrenia (HCC)    Shingles    Substance use disorder    Trichomonal vaginitis     Past Surgical History:  Procedure Laterality Date   DILATION AND  EVACUATION     KNEE SURGERY     Family History: No family history on file. Family Psychiatric  History: Unremarkable Social History:  Social History   Substance and Sexual Activity  Alcohol Use None     Social History   Substance and Sexual Activity  Drug Use Not on file    Social History   Socioeconomic History   Marital status: Single    Spouse name: Not on file   Number of children: Not on file   Years of education: Not on file   Highest education level: Not on file   Occupational History   Not on file  Tobacco Use   Smoking status: Not on file   Smokeless tobacco: Not on file  Substance and Sexual Activity   Alcohol use: Not on file   Drug use: Not on file   Sexual activity: Not on file  Other Topics Concern   Not on file  Social History Narrative   Not on file   Social Determinants of Health   Financial Resource Strain: Not on file  Food Insecurity: Not on file  Transportation Needs: Not on file  Physical Activity: Not on file  Stress: Not on file  Social Connections: Not on file   Additional Social History:   Patient was staying at the [facility] for the past week.  This is because her lease was up with her friend.  Patient reports finding another apartment that she will share with a roommate after discharge.  Patient's 2 older kids aged 65 and 14 years older are staying with patient's mother in [place].  Patient delivered her newborn baby on 1122 as a vaginal delivery at home and he was a boy.  The baby's father is not involved.  Mother reports DSS is involved in this case.  Mother reports that the newborn baby is also going through withdrawal.  Allergies:  No Known Allergies  Labs:  Results for orders placed or performed during the hospital encounter of 11/16/23 (from the past 48 hour(s))  Hepatitis C antibody     Status: None   Collection Time: 11/16/23 12:23 PM  Result Value Ref Range   HCV Ab NON REACTIVE NON REACTIVE    Comment: (NOTE) Nonreactive HCV antibody screen is consistent with no HCV infections,  unless recent infection is suspected or other evidence exists to indicate HCV infection.  Performed at [facility], [address], [place], [place] [address]   CBC     Status: Abnormal   Collection Time: 11/17/23  4:50 AM  Result Value Ref Range   WBC 7.5 4.0 - 10.5 K/uL   RBC 2.40 (L) 3.87 - 5.11 MIL/uL   Hemoglobin 7.4 (L) 12.0 - 15.0 g/dL    Comment: REPEATED TO VERIFY   HCT 21.5 (L) 36.0 - 46.0 %    MCV 89.6 80.0 - 100.0 fL   MCH 30.8 26.0 - 34.0 pg   MCHC 34.4 30.0 - 36.0 g/dL   RDW 65.7 84.6 - 96.2 %   Platelets 189 150 - 400 K/uL   nRBC 0.0 0.0 - 0.2 %    Comment: Performed at [facility], [address] [place], [place] [address]    Current Facility-Administered Medications  Medication Dose Route Frequency Provider Last Rate Last Admin   acetaminophen (TYLENOL) tablet 650 mg  650 mg Oral Q4H PRN Warden Fillers, MD   650 mg at 11/16/23 2105   benzocaine-Menthol (DERMOPLAST) 20-0.5 % topical  spray 1 Application  1 Application Topical PRN Warden Fillers, MD   1 Application at 11/17/23 0401   buprenorphine-naloxone (SUBOXONE) 8-2 mg per SL tablet 1 tablet  1 tablet Sublingual Q8H Hessie Dibble, MD   1 tablet at 11/18/23 1610   coconut oil  1 Application Topical PRN Warden Fillers, MD       cyclobenzaprine (FLEXERIL) tablet 10 mg  10 mg Oral TID PRN Hessie Dibble, MD   10 mg at 11/17/23 2138   witch hazel-glycerin (TUCKS) pad 1 Application  1 Application Topical PRN Warden Fillers, MD       And   dibucaine (NUPERCAINAL) 1 % rectal ointment 1 Application  1 Application Rectal PRN Warden Fillers, MD       diphenhydrAMINE (BENADRYL) capsule 25 mg  25 mg Oral Q6H PRN Hessie Dibble, MD   25 mg at 11/17/23 1835   ferrous sulfate tablet 325 mg  325 mg Oral Daily Harvie Bridge C, MD   325 mg at 11/18/23 1100   gabapentin (NEURONTIN) capsule 300 mg  300 mg Oral Q8H Joanne Gavel, MD   300 mg at 11/18/23 9604   ibuprofen (ADVIL) tablet 600 mg  600 mg Oral Q6H Warden Fillers, MD   600 mg at 11/18/23 1111   loperamide (IMODIUM) capsule 2 mg  2 mg Oral PRN Hessie Dibble, MD       nicotine (NICODERM CQ - dosed in mg/24 hours) patch 21 mg  21 mg Transdermal Daily Mittie Bodo C, MD   21 mg at 11/17/23 1833   ondansetron (ZOFRAN) tablet 4 mg  4 mg Oral Q4H PRN Warden Fillers, MD       Or   ondansetron [facility]) injection 4 mg  4 mg Intravenous Q4H PRN Warden Fillers, MD       oxyCODONE (Oxy IR/ROXICODONE) immediate release tablet 10 mg  10 mg Oral Q4H PRN Wyn Forster, MD       prenatal multivitamin tablet 1 tablet  1 tablet Oral Q1200 Warden Fillers, MD       senna-docusate (Senokot-S) tablet 2 tablet  2 tablet Oral Daily Warden Fillers, MD   2 tablet at 11/18/23 1100   simethicone (MYLICON) chewable tablet 80 mg  80 mg Oral PRN Warden Fillers, MD       sodium chloride flush (NS) 0.9 % injection 10 mL  10 mL Intravenous Q12H Warden Fillers, MD       Tdap Leda Min) injection 0.5 mL  0.5 mL Intramuscular Once Warden Fillers, MD       zolpidem (AMBIEN) tablet 5 mg  5 mg Oral QHS PRN Warden Fillers, MD   5 mg at 11/17/23 1835    Musculoskeletal: Strength & Muscle Tone: within normal limits Gait & Station: normal Patient leans: N/A            Psychiatric Specialty Exam:  Presentation  General Appearance:  Appropriate for Environment; Casual; Fairly Groomed  Eye Contact: Minimal  Speech: Clear and Coherent; Normal Rate  Speech Volume: Normal  Handedness: Right   Mood and Affect  Mood: Anxious  Affect: Appropriate; Congruent; Constricted   Thought Process  Thought Processes: Coherent; Goal Directed; Linear  Descriptions of Associations:Intact  Orientation:Full (Time, Place and Person)  Thought Content:Logical; WDL  History of Schizophrenia/Schizoaffective disorder:No data recorded Duration of Psychotic Symptoms:No data recorded Hallucinations:Hallucinations: None  Ideas of Reference:None  Suicidal Thoughts:Suicidal Thoughts:  No  Homicidal Thoughts:Homicidal Thoughts: No   Sensorium  Memory: Immediate Fair; Recent Fair; Remote Fair  Judgment: Fair  Insight: Fair   Chartered certified accountant: Fair  Attention Span: Fair  Recall: [employer] of Knowledge: Fair  Language: Fair   Psychomotor Activity  Psychomotor Activity: Psychomotor Activity: Normal   Assets   Assets: Communication Skills; Desire for Improvement   Sleep  Sleep: Sleep: Fair   Physical Exam: Physical Exam ROS Blood pressure 115/73, pulse 87, temperature 98.1 F (36.7 C), temperature source Oral, resp. rate 18, SpO2 98%, unknown if currently breastfeeding. There is no height or weight on file to calculate BMI.  Treatment Plan Summary: I agree with continuing Suboxone treatment as ordered for opiate withdrawal and maintenance.  Patient is agreeable to outpatient substance abuse rehab.  Patient is agreeable to staying 1 more night in the hospital for observation.  Patient does not feel she needs to restart any psychotropic medications at this time since she has not taken any psych meds since the age of 50.  Patient's mother does not feel that patient is a safety risk to herself or others at this point.  Mother just feels that patient needs substance abuse treatment.  Disposition: No evidence of imminent risk to self or others at present.   Patient does not meet criteria for psychiatric inpatient admission. Supportive therapy provided about ongoing stressors. Refer to IOP. Discussed crisis plan, support from social network, calling 911, coming to the Emergency Department, and calling Suicide Hotline.  Ancil Linsey, MD 11/18/2023 11:22 AM

## 2023-11-18 NOTE — Progress Notes (Signed)
Post Partum Day 2 s/p SVD Subjective: no complaints, up ad lib, voiding, tolerating PO, and + flatus Feeling much better than yesterday. Feel suboxone and nicotine patches are working. Denies SI or mood concerns. Denies dizziness/lightheadedness or SOB.  Objective: Blood pressure 115/73, pulse 87, temperature 98.1 F (36.7 C), temperature source Oral, resp. rate 18, SpO2 98%, unknown if currently breastfeeding.  Physical Exam:  General: sleeping in bed, easily woken up then alert, oriented and conversant Lochia: appropriate Uterine Fundus: not assessed Incision: n/a DVT Evaluation: No evidence of DVT seen on physical exam.  Recent Labs    11/16/23 1104 11/17/23 0450  HGB 10.4* 7.4*  HCT 30.5* 21.5*    Assessment/Plan: Postpartum - Contraception: declines - MOF: Formula - Rh status: Rh+ - Rubella status: Immune - Dispo: likely discharge PPD3 - Consults: SW  Neonatal - Doing well per patient. In nursery - will not be discharged home with [name] - Circumcision: will need to readdress with baby's guardian  3. Bipolar disorder, schizophrenia, substance use disorder - continue suboxone 8mg  TID, nicotine patch - s/p psych consult - SW consulted - UDS pending. Pt reports fentanyl use - plan to monitor overnight tonight to make sure patient stable/comfortable with this dose - follow up in REACH  4. Acute blood loss anemia - denies si/sx anemia - po iron ordered   LOS: 2 days   Lennart Pall 11/18/2023, 1:28 PM

## 2023-11-18 NOTE — Progress Notes (Addendum)
CSW acknowledged consult for inpatient substance abuse counseling and safe needle exchange information.   CSW met with [name] at bedside. CSW introduced self and explained reason for consult. [name] was welcoming, open, pleasant, and remained engaged during conversation. CSW and [name] discussed [name]'s substance use history. [name] reported that she started using opiates in February 2024, noting specifically Fentanyl use. [name] reported that prior to this she only used marijuana in the past. [name] reported that she used Fentanyl daily, nasally. [name] denied any IV substance use. [name] attributed substance use to codependency to previous partner (FOB). CSW and [name] discussed how people and environments can play a role in substance use. [name] agreed and verbalized understanding. CSW inquired about [name]'s plans to be in communication with FOB, [name] reported that she does not plan to communicate with FOB. [name] reported that she plans to utilize Suboxone for treatment through the [facility] clinic. [name] denied needing any additional treatment resources. CSW informed [name] about treatment options and asked if CSW could leave resources in case [name] needs them or becomes interested, [name] agreed. CSW inquired about how [name] is feeling today, [name] reported that she is feeling much better today than yesterday. CSW congratulated [name] on taking a step towards sobriety. CSW inquired about how [name] is feeling emotionally, [name] reported that she feels okay and is just upset because she can't see infant. CSW acknowledged, verbalized, and normalized [name]'s feelings. CSW acknowledged that it is documented that there is a CFT meeting planned tomorrow and hopefully there will be an opportunity for [name] to see infant. CSW assessed for safety, [name] denied SI, HI, and domestic violence. CSW inquired about [name]'s support system, [name] reported that she has a pretty good support system and shared that they are now familiar with her substance use. [name]'s mother [name] [phone])  entered the room, CSW greeted [name]'s mother. [name]'s mother inquired about a time for the meeting tomorrow, [name] granted CSW verbal permission to share information with her mother. CSW explained that there is no time documented and agreed to call [name]'s mother with updates tomorrow, [name] agreed that her mother is able to get calls with updates. CSW asked if there was any additional resources/supports that [name] needed at this time, [name] reported no needs. [name]'s mother inquired about ability to get assistance with baby supplies. CSW agreed to follow up with [name] and [name]'s mother about baby supplies after the CFT meeting.   Celso Sickle, LCSW Clinical Social Worker [facility] Cell#: [phone]

## 2023-11-19 ENCOUNTER — Encounter: Payer: Self-pay | Admitting: Obstetrics & Gynecology

## 2023-11-19 MED ORDER — BUPRENORPHINE HCL 8 MG SL SUBL
8.0000 mg | SUBLINGUAL_TABLET | Freq: Three times a day (TID) | SUBLINGUAL | Status: DC
  Administered 2023-11-19: 8 mg via SUBLINGUAL
  Filled 2023-11-19 (×3): qty 1

## 2023-11-19 MED ORDER — BUPRENORPHINE HCL 8 MG SL SUBL: 8.0000 mg | SUBLINGUAL_TABLET | Freq: Three times a day (TID) | SUBLINGUAL | 0 refills | Status: AC

## 2023-11-19 MED ORDER — IBUPROFEN 600 MG PO TABS: 600.0000 mg | ORAL_TABLET | Freq: Four times a day (QID) | ORAL | 0 refills | Status: AC

## 2023-11-19 MED ORDER — DICYCLOMINE HCL 10 MG PO CAPS: 10.0000 mg | ORAL_CAPSULE | Freq: Four times a day (QID) | ORAL | Status: DC | PRN

## 2023-11-19 MED ORDER — BUPRENORPHINE HCL 2 MG SL SUBL
4.0000 mg | SUBLINGUAL_TABLET | SUBLINGUAL | Status: AC
  Administered 2023-11-19: 4 mg via SUBLINGUAL
  Filled 2023-11-19: qty 2

## 2023-11-19 MED ORDER — FERROUS SULFATE 325 (65 FE) MG PO TABS: 325.0000 mg | ORAL_TABLET | Freq: Every day | ORAL | 3 refills | Status: AC

## 2023-11-19 MED ORDER — CLONIDINE HCL 0.1 MG PO TABS
0.1000 mg | ORAL_TABLET | Freq: Three times a day (TID) | ORAL | Status: DC | PRN
  Administered 2023-11-19: 0.1 mg via ORAL
  Filled 2023-11-19 (×2): qty 1

## 2023-11-19 NOTE — Social Work (Signed)
CSW received a call form CPS SW Destinee Currie, CPS SW notified CSW the CFT Meeting will be held 11/26 @ 10am. CPS SW notified [name] of this update.   Wende Neighbors, LCSWA Clinical Social Worker [phone]

## 2023-11-19 NOTE — Discharge Summary (Signed)
Postpartum Discharge Summary     Patient Name: Stacy Peterson DOB: 12-27-1990 MRN: [id number]  Date of admission: 11/16/2023 Delivery date:11/16/2023 Delivering provider:   Date of discharge: 11/19/2023  Admitting diagnosis: Normal labor and delivery [O80] Vaginal delivery [O80] Intrauterine pregnancy: Unknown     Secondary diagnosis:  Principal Problem:   Vaginal delivery at home Active Problems:   Substance use disorder   Schizophrenia (HCC)   Bipolar 1 disorder (HCC)   No prenatal care in current pregnancy  Additional problems: OUD, in withdrawal    Discharge diagnosis: Term Pregnancy Delivered                                              Post partum procedures: none Augmentation:  none Complications: None  Hospital course: Onset of Labor With Vaginal Delivery      32 y.o. yo [id number] at Unknown was admitted after delivering baby at hotel around 8:45am. She was brought to the hospital by EMS. Delivery of placenta was done in hotel as well. Patient's prenatal course complicated by no prenatal care and OUD on fentanyl. Here, she was placed on suboxone postpartum. She was not tolerating it and was transitioned to subutex, which she tolerated. She will be discharged to home to follow up in the office.  Newborn Data: Birth date:11/16/2023 Birth time:8:45 AM Gender:Female Living status:Living Apgars: ,  Weight:3150 g  Magnesium Sulfate received: No BMZ received: No Rhophylac:N/A MMR:N/A T-DaP:Given postpartum Flu: N/A RSV Vaccine received: No Transfusion:No  Immunizations received: There is no immunization history for the selected administration types on file for this patient.  Physical exam  Vitals:   11/18/23 1401 11/18/23 2100 11/19/23 0553 11/19/23 1301  BP: 119/81 112/62 113/75 117/74  Pulse: 93 79 90   Resp:  18 18   Temp: 98.9 F (37.2 C) 98 F (36.7 C) 98.2 F (36.8 C)   TempSrc: Oral Oral Oral   SpO2: 99% 99% 98%    General: alert, cooperative, and  no distress Lochia: appropriate Uterine Fundus: firm Incision: N/A DVT Evaluation: No evidence of DVT seen on physical exam. Negative Homan's sign. No cords or calf tenderness. Labs: Lab Results  Component Value Date   WBC 7.5 11/17/2023   HGB 7.4 (L) 11/17/2023   HCT 21.5 (L) 11/17/2023   MCV 89.6 11/17/2023   PLT 189 11/17/2023      Latest Ref Rng & Units 11/16/2023   11:04 AM  CMP  Glucose 70 - 99 mg/dL 606   BUN 6 - 20 mg/dL 9   Creatinine 3.01 - 6.01 mg/dL 0.93   Sodium 235 - 573 mmol/L 134   Potassium 3.5 - 5.1 mmol/L 3.7   Chloride 98 - 111 mmol/L 104   CO2 22 - 32 mmol/L 20   Calcium 8.9 - 10.3 mg/dL 9.7   Total Protein 6.5 - 8.1 g/dL 6.2   Total Bilirubin <2.2 mg/dL 0.6   Alkaline Phos 38 - 126 U/L 153   AST 15 - 41 U/L 19   ALT 0 - 44 U/L 13    Edinburgh Score:    11/17/2023   10:00 AM  Edinburgh Postnatal Depression Scale Screening Tool  I have been able to laugh and see the funny side of things. --   No data recorded  After visit meds:  Allergies as of 11/19/2023   No Known  Allergies   Med Rec must be completed prior to using this [facility]***        Discharge home in stable condition Infant Feeding: Bottle Infant Disposition:rooming in Discharge instruction: per After Visit Summary and Postpartum booklet. Activity: Advance as tolerated. Pelvic rest for 6 weeks.  Diet: routine diet Future Appointments:No future appointments. Follow up Visit:   Please schedule this patient for a In person postpartum visit in 1 week with the following provider: MD - Stacy Peterson Additional Postpartum F/U: none   High risk pregnancy complicated by:  Opioid use disorder Delivery mode:  Vaginal, Spontaneous Anticipated Birth Control:  Unsure   11/19/2023 Stacy Heritage, DO

## 2023-11-19 NOTE — Progress Notes (Signed)
Post Partum Day 3 Subjective: Has been on suboxone since yesterday - taking 24mg . Feels very hot, anxious, restless. She doesn't think that the suboxone is working. Was using about half gram of fentanyl a day until Saturday. Last dose was Saturday.   Objective: Blood pressure 113/75, pulse 90, temperature 98.2 F (36.8 C), temperature source Oral, resp. rate 18, SpO2 98%, unknown if currently breastfeeding.  Physical Exam:  General: alert, cooperative, and no distress Lochia: appropriate Uterine Fundus: firm Incision: n/a DVT Evaluation: No evidence of DVT seen on physical exam. Negative Homan's sign. No cords or calf tenderness.  Recent Labs    11/17/23 0450  HGB 7.4*  HCT 21.5*    Assessment/Plan: I discussed options with patient including symptomatic treatment.  I agree that she may be in withdrawal and not completely covered by the suboxone. I offered an additional dose of suboxone. Patient declined. I offered to switch her to subutex. She agreed to that. Will do now dose of subutex, then switch to 8mg  tablets. Will also give clonidine 0.1mg  qid prn.   LOS: 3 days   Levie Heritage, DO 11/19/2023, 12:06 PM

## 2023-11-19 NOTE — Progress Notes (Signed)
Patient ID: Stacy Peterson, female   DOB: 29-Sep-1991, 32 y.o.   MRN: [id number]  Patient reevaluated. Doing much better on the subutex. Anxiousness improved, restless legs improved, the heat allover her body improved. At this point, safe for discharge on subutex. Discussed how to take. Will give enough for 2 weeks and follow up in office.  Levie Heritage, DO

## 2023-11-26 LAB — DRUG SCREEN 10 W/CONF, SERUM
Amphetamines, IA: NEGATIVE ng/mL
Barbiturates, IA: NEGATIVE ug/mL
Benzodiazepines, IA: NEGATIVE ng/mL
Cocaine & Metabolite, IA: NEGATIVE ng/mL
Methadone, IA: NEGATIVE ng/mL
Opiates, IA: POSITIVE ng/mL — AB
Oxycodones, IA: NEGATIVE ng/mL
Phencyclidine, IA: NEGATIVE ng/mL
Propoxyphene, IA: NEGATIVE ng/mL
THC(Marijuana) Metabolite, IA: NEGATIVE ng/mL

## 2023-11-27 ENCOUNTER — Telehealth (HOSPITAL_COMMUNITY): Payer: Self-pay

## 2023-11-27 NOTE — Telephone Encounter (Signed)
11/27/2023, 1518  Name: Stacy Peterson MRN: [id number] DOB: 05/27/1991  Reason for Call:  Transition of Care Hospital Discharge Call  Contact Status: Patient Contact Status: Message  Language assistant needed:          Follow-Up Questions:    Inocente Salles Postnatal Depression Scale:  In the Past 7 Days:    PHQ2-9 Depression Scale:     Discharge Follow-up:    Post-discharge interventions: NA  Signature  Stacy Peterson

## 2023-12-06 ENCOUNTER — Encounter: Payer: Self-pay | Admitting: Family Medicine

## 2023-12-06 ENCOUNTER — Institutional Professional Consult (permissible substitution): Payer: Medicaid Other

## 2023-12-06 ENCOUNTER — Ambulatory Visit: Payer: Medicaid Other | Admitting: Family Medicine

## 2023-12-06 NOTE — Progress Notes (Signed)
Patient did not keep appointment today. She will be called to reschedule.
